# Patient Record
Sex: Female | Born: 1940 | Race: White | Hispanic: No | Smoking: Never smoker
Health system: Southern US, Community
[De-identification: ages and names within clinical notes are randomized; demographics above are authoritative.]

## PROBLEM LIST (undated history)

## (undated) DIAGNOSIS — M1990 Unspecified osteoarthritis, unspecified site: Secondary | ICD-10-CM

## (undated) DIAGNOSIS — K8020 Calculus of gallbladder without cholecystitis without obstruction: Secondary | ICD-10-CM

## (undated) DIAGNOSIS — M359 Systemic involvement of connective tissue, unspecified: Secondary | ICD-10-CM

## (undated) DIAGNOSIS — I471 Supraventricular tachycardia: Secondary | ICD-10-CM

## (undated) DIAGNOSIS — I10 Essential (primary) hypertension: Secondary | ICD-10-CM

## (undated) DIAGNOSIS — F419 Anxiety disorder, unspecified: Secondary | ICD-10-CM

## (undated) DIAGNOSIS — N816 Rectocele: Secondary | ICD-10-CM

## (undated) DIAGNOSIS — N814 Uterovaginal prolapse, unspecified: Secondary | ICD-10-CM

## (undated) DIAGNOSIS — I480 Paroxysmal atrial fibrillation: Secondary | ICD-10-CM

## (undated) HISTORY — DX: Supraventricular tachycardia: I47.1

## (undated) HISTORY — DX: Paroxysmal atrial fibrillation: I48.0

---

## 1998-03-27 ENCOUNTER — Other Ambulatory Visit: Admission: RE | Admit: 1998-03-27 | Discharge: 1998-03-27 | Payer: Self-pay | Admitting: *Deleted

## 1999-06-09 ENCOUNTER — Encounter: Admission: RE | Admit: 1999-06-09 | Discharge: 1999-06-09 | Payer: Self-pay | Admitting: *Deleted

## 1999-06-09 ENCOUNTER — Encounter: Payer: Self-pay | Admitting: *Deleted

## 1999-06-09 ENCOUNTER — Other Ambulatory Visit: Admission: RE | Admit: 1999-06-09 | Discharge: 1999-06-09 | Payer: Self-pay | Admitting: *Deleted

## 2000-07-20 ENCOUNTER — Other Ambulatory Visit: Admission: RE | Admit: 2000-07-20 | Discharge: 2000-07-20 | Payer: Self-pay | Admitting: *Deleted

## 2000-10-10 ENCOUNTER — Encounter: Admission: RE | Admit: 2000-10-10 | Discharge: 2000-10-10 | Payer: Self-pay | Admitting: *Deleted

## 2000-10-10 ENCOUNTER — Encounter: Payer: Self-pay | Admitting: *Deleted

## 2002-06-18 ENCOUNTER — Encounter: Payer: Self-pay | Admitting: Cardiology

## 2002-06-18 ENCOUNTER — Ambulatory Visit (HOSPITAL_COMMUNITY): Admission: RE | Admit: 2002-06-18 | Discharge: 2002-06-19 | Payer: Self-pay | Admitting: Cardiology

## 2002-07-08 ENCOUNTER — Other Ambulatory Visit: Admission: RE | Admit: 2002-07-08 | Discharge: 2002-07-08 | Payer: Self-pay | Admitting: Obstetrics & Gynecology

## 2003-07-23 ENCOUNTER — Other Ambulatory Visit: Admission: RE | Admit: 2003-07-23 | Discharge: 2003-07-23 | Payer: Self-pay | Admitting: Obstetrics & Gynecology

## 2005-02-10 ENCOUNTER — Other Ambulatory Visit: Admission: RE | Admit: 2005-02-10 | Discharge: 2005-02-10 | Payer: Self-pay | Admitting: Obstetrics & Gynecology

## 2005-02-23 ENCOUNTER — Encounter: Admission: RE | Admit: 2005-02-23 | Discharge: 2005-02-23 | Payer: Self-pay | Admitting: Obstetrics & Gynecology

## 2009-04-25 HISTORY — PX: EYE SURGERY: SHX253

## 2011-08-19 DIAGNOSIS — S2341XA Sprain of ribs, initial encounter: Secondary | ICD-10-CM | POA: Diagnosis not present

## 2011-08-19 DIAGNOSIS — R0602 Shortness of breath: Secondary | ICD-10-CM | POA: Diagnosis not present

## 2011-08-19 DIAGNOSIS — S239XXA Sprain of unspecified parts of thorax, initial encounter: Secondary | ICD-10-CM | POA: Diagnosis not present

## 2011-08-24 DIAGNOSIS — S239XXA Sprain of unspecified parts of thorax, initial encounter: Secondary | ICD-10-CM | POA: Diagnosis not present

## 2011-08-24 DIAGNOSIS — S2341XA Sprain of ribs, initial encounter: Secondary | ICD-10-CM | POA: Diagnosis not present

## 2011-08-24 DIAGNOSIS — R5381 Other malaise: Secondary | ICD-10-CM | POA: Diagnosis not present

## 2011-08-24 DIAGNOSIS — E782 Mixed hyperlipidemia: Secondary | ICD-10-CM | POA: Diagnosis not present

## 2011-08-31 DIAGNOSIS — J309 Allergic rhinitis, unspecified: Secondary | ICD-10-CM | POA: Diagnosis not present

## 2011-08-31 DIAGNOSIS — M545 Low back pain: Secondary | ICD-10-CM | POA: Diagnosis not present

## 2011-08-31 DIAGNOSIS — M1990 Unspecified osteoarthritis, unspecified site: Secondary | ICD-10-CM | POA: Diagnosis not present

## 2011-08-31 DIAGNOSIS — S239XXA Sprain of unspecified parts of thorax, initial encounter: Secondary | ICD-10-CM | POA: Diagnosis not present

## 2011-08-31 DIAGNOSIS — E782 Mixed hyperlipidemia: Secondary | ICD-10-CM | POA: Diagnosis not present

## 2011-08-31 DIAGNOSIS — L723 Sebaceous cyst: Secondary | ICD-10-CM | POA: Diagnosis not present

## 2011-11-23 DIAGNOSIS — M7020 Olecranon bursitis, unspecified elbow: Secondary | ICD-10-CM | POA: Diagnosis not present

## 2012-02-27 DIAGNOSIS — M545 Low back pain: Secondary | ICD-10-CM | POA: Diagnosis not present

## 2012-02-27 DIAGNOSIS — E782 Mixed hyperlipidemia: Secondary | ICD-10-CM | POA: Diagnosis not present

## 2012-02-27 DIAGNOSIS — M1990 Unspecified osteoarthritis, unspecified site: Secondary | ICD-10-CM | POA: Diagnosis not present

## 2012-03-01 DIAGNOSIS — M545 Low back pain: Secondary | ICD-10-CM | POA: Diagnosis not present

## 2012-03-01 DIAGNOSIS — E782 Mixed hyperlipidemia: Secondary | ICD-10-CM | POA: Diagnosis not present

## 2012-03-01 DIAGNOSIS — M1990 Unspecified osteoarthritis, unspecified site: Secondary | ICD-10-CM | POA: Diagnosis not present

## 2012-03-01 DIAGNOSIS — J309 Allergic rhinitis, unspecified: Secondary | ICD-10-CM | POA: Diagnosis not present

## 2012-04-06 DIAGNOSIS — J019 Acute sinusitis, unspecified: Secondary | ICD-10-CM | POA: Diagnosis not present

## 2012-05-09 DIAGNOSIS — Z124 Encounter for screening for malignant neoplasm of cervix: Secondary | ICD-10-CM | POA: Diagnosis not present

## 2012-08-28 DIAGNOSIS — E782 Mixed hyperlipidemia: Secondary | ICD-10-CM | POA: Diagnosis not present

## 2012-08-28 DIAGNOSIS — J309 Allergic rhinitis, unspecified: Secondary | ICD-10-CM | POA: Diagnosis not present

## 2012-08-28 DIAGNOSIS — M1990 Unspecified osteoarthritis, unspecified site: Secondary | ICD-10-CM | POA: Diagnosis not present

## 2012-08-28 DIAGNOSIS — M545 Low back pain: Secondary | ICD-10-CM | POA: Diagnosis not present

## 2012-08-30 DIAGNOSIS — E782 Mixed hyperlipidemia: Secondary | ICD-10-CM | POA: Diagnosis not present

## 2012-09-05 DIAGNOSIS — E782 Mixed hyperlipidemia: Secondary | ICD-10-CM | POA: Diagnosis not present

## 2012-09-05 DIAGNOSIS — M545 Low back pain: Secondary | ICD-10-CM | POA: Diagnosis not present

## 2012-09-05 DIAGNOSIS — M810 Age-related osteoporosis without current pathological fracture: Secondary | ICD-10-CM | POA: Diagnosis not present

## 2012-09-05 DIAGNOSIS — J309 Allergic rhinitis, unspecified: Secondary | ICD-10-CM | POA: Diagnosis not present

## 2012-09-05 DIAGNOSIS — M1990 Unspecified osteoarthritis, unspecified site: Secondary | ICD-10-CM | POA: Diagnosis not present

## 2012-12-25 DIAGNOSIS — R000 Tachycardia, unspecified: Secondary | ICD-10-CM | POA: Diagnosis not present

## 2012-12-25 DIAGNOSIS — J019 Acute sinusitis, unspecified: Secondary | ICD-10-CM | POA: Diagnosis not present

## 2013-03-12 DIAGNOSIS — E782 Mixed hyperlipidemia: Secondary | ICD-10-CM | POA: Diagnosis not present

## 2013-03-12 DIAGNOSIS — M810 Age-related osteoporosis without current pathological fracture: Secondary | ICD-10-CM | POA: Diagnosis not present

## 2013-03-12 DIAGNOSIS — M1990 Unspecified osteoarthritis, unspecified site: Secondary | ICD-10-CM | POA: Diagnosis not present

## 2013-03-19 DIAGNOSIS — Z0000 Encounter for general adult medical examination without abnormal findings: Secondary | ICD-10-CM | POA: Diagnosis not present

## 2013-03-19 DIAGNOSIS — E782 Mixed hyperlipidemia: Secondary | ICD-10-CM | POA: Diagnosis not present

## 2013-03-19 DIAGNOSIS — J309 Allergic rhinitis, unspecified: Secondary | ICD-10-CM | POA: Diagnosis not present

## 2013-03-19 DIAGNOSIS — M810 Age-related osteoporosis without current pathological fracture: Secondary | ICD-10-CM | POA: Diagnosis not present

## 2013-03-19 DIAGNOSIS — M1990 Unspecified osteoarthritis, unspecified site: Secondary | ICD-10-CM | POA: Diagnosis not present

## 2013-03-19 DIAGNOSIS — Z1331 Encounter for screening for depression: Secondary | ICD-10-CM | POA: Diagnosis not present

## 2013-03-19 DIAGNOSIS — M545 Low back pain: Secondary | ICD-10-CM | POA: Diagnosis not present

## 2013-05-23 DIAGNOSIS — M810 Age-related osteoporosis without current pathological fracture: Secondary | ICD-10-CM | POA: Diagnosis not present

## 2013-05-23 DIAGNOSIS — Z124 Encounter for screening for malignant neoplasm of cervix: Secondary | ICD-10-CM | POA: Diagnosis not present

## 2013-05-23 DIAGNOSIS — Z01419 Encounter for gynecological examination (general) (routine) without abnormal findings: Secondary | ICD-10-CM | POA: Diagnosis not present

## 2013-05-23 DIAGNOSIS — N959 Unspecified menopausal and perimenopausal disorder: Secondary | ICD-10-CM | POA: Diagnosis not present

## 2013-05-23 DIAGNOSIS — Z1231 Encounter for screening mammogram for malignant neoplasm of breast: Secondary | ICD-10-CM | POA: Diagnosis not present

## 2015-01-23 DIAGNOSIS — I10 Essential (primary) hypertension: Secondary | ICD-10-CM | POA: Diagnosis not present

## 2015-01-23 DIAGNOSIS — K210 Gastro-esophageal reflux disease with esophagitis: Secondary | ICD-10-CM | POA: Diagnosis not present

## 2015-01-23 DIAGNOSIS — E782 Mixed hyperlipidemia: Secondary | ICD-10-CM | POA: Diagnosis not present

## 2015-01-30 DIAGNOSIS — Z23 Encounter for immunization: Secondary | ICD-10-CM | POA: Diagnosis not present

## 2015-01-30 DIAGNOSIS — K219 Gastro-esophageal reflux disease without esophagitis: Secondary | ICD-10-CM | POA: Diagnosis not present

## 2015-01-30 DIAGNOSIS — E782 Mixed hyperlipidemia: Secondary | ICD-10-CM | POA: Diagnosis not present

## 2015-01-30 DIAGNOSIS — I10 Essential (primary) hypertension: Secondary | ICD-10-CM | POA: Diagnosis not present

## 2015-04-28 DIAGNOSIS — W11XXXA Fall on and from ladder, initial encounter: Secondary | ICD-10-CM | POA: Diagnosis not present

## 2015-04-28 DIAGNOSIS — M940 Chondrocostal junction syndrome [Tietze]: Secondary | ICD-10-CM | POA: Diagnosis not present

## 2015-06-12 DIAGNOSIS — J111 Influenza due to unidentified influenza virus with other respiratory manifestations: Secondary | ICD-10-CM | POA: Diagnosis not present

## 2015-06-18 DIAGNOSIS — Z1231 Encounter for screening mammogram for malignant neoplasm of breast: Secondary | ICD-10-CM | POA: Diagnosis not present

## 2015-06-18 DIAGNOSIS — N390 Urinary tract infection, site not specified: Secondary | ICD-10-CM | POA: Diagnosis not present

## 2015-06-18 DIAGNOSIS — M816 Localized osteoporosis [Lequesne]: Secondary | ICD-10-CM | POA: Diagnosis not present

## 2015-06-18 DIAGNOSIS — N958 Other specified menopausal and perimenopausal disorders: Secondary | ICD-10-CM | POA: Diagnosis not present

## 2015-06-18 DIAGNOSIS — Z6822 Body mass index (BMI) 22.0-22.9, adult: Secondary | ICD-10-CM | POA: Diagnosis not present

## 2015-06-18 DIAGNOSIS — Z124 Encounter for screening for malignant neoplasm of cervix: Secondary | ICD-10-CM | POA: Diagnosis not present

## 2015-09-01 DIAGNOSIS — I10 Essential (primary) hypertension: Secondary | ICD-10-CM | POA: Diagnosis not present

## 2015-09-01 DIAGNOSIS — E782 Mixed hyperlipidemia: Secondary | ICD-10-CM | POA: Diagnosis not present

## 2015-09-01 DIAGNOSIS — K210 Gastro-esophageal reflux disease with esophagitis: Secondary | ICD-10-CM | POA: Diagnosis not present

## 2015-09-04 DIAGNOSIS — Z0001 Encounter for general adult medical examination with abnormal findings: Secondary | ICD-10-CM | POA: Diagnosis not present

## 2015-09-04 DIAGNOSIS — K219 Gastro-esophageal reflux disease without esophagitis: Secondary | ICD-10-CM | POA: Diagnosis not present

## 2015-09-04 DIAGNOSIS — Z1212 Encounter for screening for malignant neoplasm of rectum: Secondary | ICD-10-CM | POA: Diagnosis not present

## 2015-09-04 DIAGNOSIS — I10 Essential (primary) hypertension: Secondary | ICD-10-CM | POA: Diagnosis not present

## 2015-09-04 DIAGNOSIS — E782 Mixed hyperlipidemia: Secondary | ICD-10-CM | POA: Diagnosis not present

## 2015-11-12 DIAGNOSIS — N819 Female genital prolapse, unspecified: Secondary | ICD-10-CM | POA: Diagnosis not present

## 2015-11-12 DIAGNOSIS — R102 Pelvic and perineal pain: Secondary | ICD-10-CM | POA: Diagnosis not present

## 2015-11-12 DIAGNOSIS — N812 Incomplete uterovaginal prolapse: Secondary | ICD-10-CM | POA: Diagnosis not present

## 2015-11-12 DIAGNOSIS — N390 Urinary tract infection, site not specified: Secondary | ICD-10-CM | POA: Diagnosis not present

## 2015-11-13 DIAGNOSIS — N812 Incomplete uterovaginal prolapse: Secondary | ICD-10-CM | POA: Diagnosis not present

## 2015-11-13 DIAGNOSIS — N390 Urinary tract infection, site not specified: Secondary | ICD-10-CM | POA: Diagnosis not present

## 2015-11-13 DIAGNOSIS — N819 Female genital prolapse, unspecified: Secondary | ICD-10-CM | POA: Diagnosis not present

## 2015-11-14 DIAGNOSIS — F418 Other specified anxiety disorders: Secondary | ICD-10-CM | POA: Diagnosis not present

## 2015-11-14 DIAGNOSIS — R002 Palpitations: Secondary | ICD-10-CM | POA: Diagnosis not present

## 2015-11-14 DIAGNOSIS — R079 Chest pain, unspecified: Secondary | ICD-10-CM | POA: Diagnosis not present

## 2015-11-14 DIAGNOSIS — Z6821 Body mass index (BMI) 21.0-21.9, adult: Secondary | ICD-10-CM | POA: Diagnosis not present

## 2015-12-21 ENCOUNTER — Other Ambulatory Visit: Payer: Self-pay

## 2016-01-18 DIAGNOSIS — N819 Female genital prolapse, unspecified: Secondary | ICD-10-CM | POA: Diagnosis not present

## 2016-06-15 DIAGNOSIS — I10 Essential (primary) hypertension: Secondary | ICD-10-CM | POA: Diagnosis not present

## 2016-06-15 DIAGNOSIS — Z6822 Body mass index (BMI) 22.0-22.9, adult: Secondary | ICD-10-CM | POA: Diagnosis not present

## 2016-06-15 DIAGNOSIS — E782 Mixed hyperlipidemia: Secondary | ICD-10-CM | POA: Diagnosis not present

## 2016-06-15 DIAGNOSIS — K219 Gastro-esophageal reflux disease without esophagitis: Secondary | ICD-10-CM | POA: Diagnosis not present

## 2016-07-01 DIAGNOSIS — Z6822 Body mass index (BMI) 22.0-22.9, adult: Secondary | ICD-10-CM | POA: Diagnosis not present

## 2016-07-01 DIAGNOSIS — I10 Essential (primary) hypertension: Secondary | ICD-10-CM | POA: Diagnosis not present

## 2016-07-26 DIAGNOSIS — I10 Essential (primary) hypertension: Secondary | ICD-10-CM | POA: Diagnosis not present

## 2016-07-26 DIAGNOSIS — Z6822 Body mass index (BMI) 22.0-22.9, adult: Secondary | ICD-10-CM | POA: Diagnosis not present

## 2016-07-26 DIAGNOSIS — R002 Palpitations: Secondary | ICD-10-CM | POA: Diagnosis not present

## 2016-07-26 DIAGNOSIS — F331 Major depressive disorder, recurrent, moderate: Secondary | ICD-10-CM | POA: Diagnosis not present

## 2016-10-04 DIAGNOSIS — H35363 Drusen (degenerative) of macula, bilateral: Secondary | ICD-10-CM | POA: Diagnosis not present

## 2016-10-10 DIAGNOSIS — Z6822 Body mass index (BMI) 22.0-22.9, adult: Secondary | ICD-10-CM | POA: Diagnosis not present

## 2016-10-10 DIAGNOSIS — Z01419 Encounter for gynecological examination (general) (routine) without abnormal findings: Secondary | ICD-10-CM | POA: Diagnosis not present

## 2016-10-10 DIAGNOSIS — R350 Frequency of micturition: Secondary | ICD-10-CM | POA: Diagnosis not present

## 2016-11-08 DIAGNOSIS — H1131 Conjunctival hemorrhage, right eye: Secondary | ICD-10-CM | POA: Diagnosis not present

## 2016-11-08 DIAGNOSIS — S0501XA Injury of conjunctiva and corneal abrasion without foreign body, right eye, initial encounter: Secondary | ICD-10-CM | POA: Diagnosis not present

## 2016-11-08 DIAGNOSIS — H11421 Conjunctival edema, right eye: Secondary | ICD-10-CM | POA: Diagnosis not present

## 2016-12-03 DIAGNOSIS — Z6821 Body mass index (BMI) 21.0-21.9, adult: Secondary | ICD-10-CM | POA: Diagnosis not present

## 2016-12-03 DIAGNOSIS — I10 Essential (primary) hypertension: Secondary | ICD-10-CM | POA: Diagnosis not present

## 2016-12-03 DIAGNOSIS — R002 Palpitations: Secondary | ICD-10-CM | POA: Diagnosis not present

## 2016-12-03 DIAGNOSIS — Z23 Encounter for immunization: Secondary | ICD-10-CM | POA: Diagnosis not present

## 2016-12-03 DIAGNOSIS — F331 Major depressive disorder, recurrent, moderate: Secondary | ICD-10-CM | POA: Diagnosis not present

## 2017-01-10 DIAGNOSIS — J0100 Acute maxillary sinusitis, unspecified: Secondary | ICD-10-CM | POA: Diagnosis not present

## 2017-01-10 DIAGNOSIS — Z6822 Body mass index (BMI) 22.0-22.9, adult: Secondary | ICD-10-CM | POA: Diagnosis not present

## 2017-01-20 DIAGNOSIS — H11823 Conjunctivochalasis, bilateral: Secondary | ICD-10-CM | POA: Diagnosis not present

## 2017-01-20 DIAGNOSIS — H04123 Dry eye syndrome of bilateral lacrimal glands: Secondary | ICD-10-CM | POA: Diagnosis not present

## 2017-01-24 DIAGNOSIS — R002 Palpitations: Secondary | ICD-10-CM | POA: Diagnosis not present

## 2017-01-24 DIAGNOSIS — Z6823 Body mass index (BMI) 23.0-23.9, adult: Secondary | ICD-10-CM | POA: Diagnosis not present

## 2017-01-31 DIAGNOSIS — R0789 Other chest pain: Secondary | ICD-10-CM | POA: Diagnosis not present

## 2017-01-31 DIAGNOSIS — N644 Mastodynia: Secondary | ICD-10-CM | POA: Diagnosis not present

## 2017-01-31 DIAGNOSIS — R7989 Other specified abnormal findings of blood chemistry: Secondary | ICD-10-CM | POA: Diagnosis not present

## 2017-01-31 DIAGNOSIS — Z79899 Other long term (current) drug therapy: Secondary | ICD-10-CM | POA: Diagnosis not present

## 2017-02-01 DIAGNOSIS — Z6822 Body mass index (BMI) 22.0-22.9, adult: Secondary | ICD-10-CM | POA: Diagnosis not present

## 2017-02-01 DIAGNOSIS — R1011 Right upper quadrant pain: Secondary | ICD-10-CM | POA: Diagnosis not present

## 2017-02-01 DIAGNOSIS — H6121 Impacted cerumen, right ear: Secondary | ICD-10-CM | POA: Diagnosis not present

## 2017-02-07 DIAGNOSIS — K8020 Calculus of gallbladder without cholecystitis without obstruction: Secondary | ICD-10-CM | POA: Diagnosis not present

## 2017-02-07 DIAGNOSIS — R1011 Right upper quadrant pain: Secondary | ICD-10-CM | POA: Diagnosis not present

## 2017-02-28 ENCOUNTER — Emergency Department (HOSPITAL_COMMUNITY): Payer: Medicare Other

## 2017-02-28 ENCOUNTER — Other Ambulatory Visit: Payer: Self-pay

## 2017-02-28 ENCOUNTER — Observation Stay (HOSPITAL_COMMUNITY)
Admission: EM | Admit: 2017-02-28 | Discharge: 2017-03-01 | Disposition: A | Discharge status: Home / Self Care | Payer: Medicare Other | Attending: Family Medicine | Admitting: Family Medicine

## 2017-02-28 ENCOUNTER — Encounter (HOSPITAL_COMMUNITY): Payer: Self-pay | Admitting: Emergency Medicine

## 2017-02-28 DIAGNOSIS — F4381 Prolonged grief disorder: Secondary | ICD-10-CM | POA: Diagnosis present

## 2017-02-28 DIAGNOSIS — Z79899 Other long term (current) drug therapy: Secondary | ICD-10-CM | POA: Insufficient documentation

## 2017-02-28 DIAGNOSIS — R7989 Other specified abnormal findings of blood chemistry: Secondary | ICD-10-CM | POA: Diagnosis not present

## 2017-02-28 DIAGNOSIS — R739 Hyperglycemia, unspecified: Secondary | ICD-10-CM | POA: Diagnosis not present

## 2017-02-28 DIAGNOSIS — E785 Hyperlipidemia, unspecified: Secondary | ICD-10-CM | POA: Diagnosis not present

## 2017-02-28 DIAGNOSIS — I480 Paroxysmal atrial fibrillation: Secondary | ICD-10-CM | POA: Diagnosis not present

## 2017-02-28 DIAGNOSIS — Z6822 Body mass index (BMI) 22.0-22.9, adult: Secondary | ICD-10-CM | POA: Diagnosis not present

## 2017-02-28 DIAGNOSIS — R748 Abnormal levels of other serum enzymes: Secondary | ICD-10-CM | POA: Diagnosis not present

## 2017-02-28 DIAGNOSIS — I471 Supraventricular tachycardia: Principal | ICD-10-CM | POA: Insufficient documentation

## 2017-02-28 DIAGNOSIS — R079 Chest pain, unspecified: Secondary | ICD-10-CM | POA: Diagnosis not present

## 2017-02-28 DIAGNOSIS — I491 Atrial premature depolarization: Secondary | ICD-10-CM | POA: Diagnosis not present

## 2017-02-28 DIAGNOSIS — I10 Essential (primary) hypertension: Secondary | ICD-10-CM | POA: Diagnosis not present

## 2017-02-28 DIAGNOSIS — R000 Tachycardia, unspecified: Secondary | ICD-10-CM | POA: Diagnosis present

## 2017-02-28 DIAGNOSIS — F4329 Adjustment disorder with other symptoms: Secondary | ICD-10-CM | POA: Diagnosis not present

## 2017-02-28 DIAGNOSIS — Z888 Allergy status to other drugs, medicaments and biological substances status: Secondary | ICD-10-CM | POA: Diagnosis not present

## 2017-02-28 DIAGNOSIS — R778 Other specified abnormalities of plasma proteins: Secondary | ICD-10-CM

## 2017-02-28 HISTORY — DX: Essential (primary) hypertension: I10

## 2017-02-28 HISTORY — DX: Unspecified osteoarthritis, unspecified site: M19.90

## 2017-02-28 HISTORY — DX: Calculus of gallbladder without cholecystitis without obstruction: K80.20

## 2017-02-28 LAB — CBC WITH DIFFERENTIAL/PLATELET
Basophils Absolute: 0 10*3/uL (ref 0.0–0.1)
Basophils Relative: 0 %
EOS PCT: 1 %
Eosinophils Absolute: 0.2 10*3/uL (ref 0.0–0.7)
HCT: 45.5 % (ref 36.0–46.0)
Hemoglobin: 15.6 g/dL — ABNORMAL HIGH (ref 12.0–15.0)
LYMPHS ABS: 2.3 10*3/uL (ref 0.7–4.0)
LYMPHS PCT: 20 %
MCH: 32.6 pg (ref 26.0–34.0)
MCHC: 34.3 g/dL (ref 30.0–36.0)
MCV: 95 fL (ref 78.0–100.0)
MONO ABS: 0.9 10*3/uL (ref 0.1–1.0)
MONOS PCT: 8 %
NEUTROS PCT: 71 %
Neutro Abs: 8 10*3/uL — ABNORMAL HIGH (ref 1.7–7.7)
PLATELETS: 243 10*3/uL (ref 150–400)
RBC: 4.79 MIL/uL (ref 3.87–5.11)
RDW: 13.5 % (ref 11.5–15.5)
WBC: 11.4 10*3/uL — ABNORMAL HIGH (ref 4.0–10.5)

## 2017-02-28 LAB — TROPONIN I: Troponin I: 0.4 ng/mL (ref <0.03)

## 2017-02-28 LAB — BASIC METABOLIC PANEL
Anion gap: 12 (ref 5–15)
BUN: 16 mg/dL (ref 6–20)
CO2: 26 mmol/L (ref 22–32)
Calcium: 9.9 mg/dL (ref 8.9–10.3)
Chloride: 99 mmol/L — ABNORMAL LOW (ref 101–111)
Creatinine, Ser: 0.68 mg/dL (ref 0.44–1.00)
GFR calc Af Amer: >60 mL/min (ref >60)
GLUCOSE: 118 mg/dL — AB (ref 65–99)
POTASSIUM: 3.8 mmol/L (ref 3.5–5.1)
Sodium: 137 mmol/L (ref 135–145)

## 2017-02-28 LAB — PROTIME-INR
INR: 1.01
Prothrombin Time: 13.2 seconds (ref 11.4–15.2)

## 2017-02-28 LAB — TSH: TSH: 3.521 u[IU]/mL (ref 0.350–4.500)

## 2017-02-28 MED ORDER — ACETAMINOPHEN 325 MG PO TABS: 650.0000 mg | ORAL_TABLET | ORAL | Status: DC | PRN

## 2017-02-28 MED ORDER — ASPIRIN EC 81 MG PO TBEC: 81.0000 mg | DELAYED_RELEASE_TABLET | Freq: Every day | ORAL | Status: DC

## 2017-02-28 MED ORDER — DILTIAZEM LOAD VIA INFUSION
10.0000 mg | Freq: Once | INTRAVENOUS | Status: AC
  Administered 2017-02-28: 10 mg via INTRAVENOUS
  Filled 2017-02-28: qty 10

## 2017-02-28 MED ORDER — ASPIRIN 325 MG PO TABS
325.0000 mg | ORAL_TABLET | Freq: Once | ORAL | Status: AC
  Administered 2017-02-28: 325 mg via ORAL
  Filled 2017-02-28: qty 1

## 2017-02-28 MED ORDER — LISINOPRIL 5 MG PO TABS: 2.5000 mg | ORAL_TABLET | Freq: Every day | ORAL | Status: DC

## 2017-02-28 MED ORDER — HEPARIN (PORCINE) IN NACL 100-0.45 UNIT/ML-% IJ SOLN
700.0000 [IU]/h | INTRAMUSCULAR | Status: DC
  Administered 2017-02-28: 700 [IU]/h via INTRAVENOUS
  Filled 2017-02-28: qty 250

## 2017-02-28 MED ORDER — ADENOSINE 6 MG/2ML IV SOLN
6.0000 mg | Freq: Once | INTRAVENOUS | Status: AC
  Administered 2017-02-28: 6 mg via INTRAVENOUS

## 2017-02-28 MED ORDER — DILTIAZEM HCL-DEXTROSE 100-5 MG/100ML-% IV SOLN (PREMIX)
5.0000 mg/h | INTRAVENOUS | Status: DC
  Administered 2017-02-28: 5 mg/h via INTRAVENOUS
  Filled 2017-02-28: qty 100

## 2017-02-28 MED ORDER — HEPARIN BOLUS VIA INFUSION
3500.0000 [IU] | Freq: Once | INTRAVENOUS | Status: AC
  Administered 2017-02-28: 3500 [IU] via INTRAVENOUS

## 2017-02-28 MED ORDER — ONDANSETRON HCL 4 MG/2ML IJ SOLN: 4.0000 mg | Freq: Four times a day (QID) | INTRAMUSCULAR | Status: DC | PRN

## 2017-02-28 MED ORDER — ADENOSINE 6 MG/2ML IV SOLN
INTRAVENOUS | Status: AC
  Administered 2017-02-28: 6 mg
  Filled 2017-02-28: qty 4

## 2017-02-28 MED ORDER — GI COCKTAIL ~~LOC~~: 30.0000 mL | Freq: Four times a day (QID) | ORAL | Status: DC | PRN

## 2017-02-28 MED ORDER — MORPHINE SULFATE (PF) 2 MG/ML IV SOLN: 2.0000 mg | INTRAVENOUS | Status: DC | PRN

## 2017-02-28 MED ORDER — HEPARIN SODIUM (PORCINE) 5000 UNIT/ML IJ SOLN: 60.0000 [IU]/kg | Freq: Once | INTRAMUSCULAR | Status: DC

## 2017-02-28 NOTE — ED Triage Notes (Signed)
Pt sent by pcp Dr Olena Heckle in [place] due to a fib. Pt in RVR in triage. Pt states has had upper left side chest aching since last night. Dizziness this am but none now. Non diaphoretic. Denies/n/v

## 2017-02-28 NOTE — ED Notes (Signed)
No change in rate. Dr Lacinda Axon ordered a second dose of adenosine 6 mg

## 2017-02-28 NOTE — H&P (Signed)
History and Physical    Kristen Mckenzie GLO:[id number] DOB: 1941/01/12 DOA: 02/28/2017  PCP: Caryl Bis, MD Consultants:  Nori Riis - OB/GYN Patient coming from:  Home - lives alone; [facility]: daughters, [phone]  Chief Complaint: Rapid heart rate  HPI: Kristen Mckenzie is a 76 y.o. female with medical history significant of HTN presenting with tachycardia.  She reports that her heart rate was high and her BP was low.  She checks her BP several times a week.  She didn't feel right this AM, lightheaded and dizzy, and so she checked her BP; it was 76/- and HR was 154.  She rechecked 2 hours later and it was 80/- and HR was 160.  She called her PCP and was told to come to the ER.  No chest pain.  No SOB.  She has had this several times before.    Her husband of 46 years died 31 months ago and she is still struggling.    ED Course: EKG with SVT 160-170, stable BP.  Given 6 mg Adenosine and she had a 5-6 second pause.  Dr. Domenic Polite suggested Cardizem.  She was given 10 mg IV Cardizem prior to drip and converted to NSR rate 80.  Troponin 0.40.  Cardiology recommends heparin; ASA; Echo.  Review of Systems: As per HPI; otherwise review of systems reviewed and negative.   Ambulatory Status:  Ambulates without assistance  Past Medical History:  Diagnosis Date  . Arthritis   . Cholecystolithiasis   . Hypertension     History reviewed. No pertinent surgical history.  Social History   Socioeconomic History  . Marital status: Married    Spouse name: Not on file  . Number of children: Not on file  . Years of education: Not on file  . Highest education level: Not on file  Social Needs  . Financial resource strain: Not on file  . Food insecurity - worry: Not on file  . Food insecurity - inability: Not on file  . Transportation needs - medical: Not on file  . Transportation needs - non-medical: Not on file  Occupational History  . Occupation: retired  Tobacco Use  . Smoking status: Never  Smoker  . Smokeless tobacco: Never Used  Substance and Sexual Activity  . Alcohol use: No    Frequency: Never  . Drug use: No  . Sexual activity: Not on file  Other Topics Concern  . Not on file  Social History Narrative  . Not on file    Allergies  Allergen Reactions  . Depakote [Valproic Acid] Hives and Itching    History reviewed. No pertinent family history.  Prior to Admission medications   Medication Sig Start Date End Date Taking? Authorizing Provider  B-COMPLEX-C PO Take 1 tablet daily by mouth.   Yes [provider]  Calcium Carbonate-Vitamin D (CALCARB 600/D PO) Take by mouth.   Yes [provider]  cholecalciferol (VITAMIN D) 1000 units tablet Take 1,000 Units daily by mouth.   Yes [provider]  COD LIVER OIL PO Take 1 capsule daily by mouth.   Yes [provider]  lisinopril (PRINIVIL,ZESTRIL) 5 MG tablet Take 2.5 mg daily by mouth.   Yes [provider]  Multiple Vitamin (MULTIVITAMIN WITH MINERALS) TABS tablet Take 1 tablet daily by mouth.   Yes [provider]  Potassium 99 MG TABS Take 1 tablet 3 (three) times a week by mouth.   Yes [provider]  vitamin A 10000 UNIT  capsule Take 10,000 Units daily by mouth.   Yes [provider]  vitamin C (ASCORBIC ACID) 500 MG tablet Take 500 mg daily by mouth.   Yes [provider]  vitamin E 400 UNIT capsule Take 400 Units daily by mouth.   Yes [provider]    Physical Exam: Vitals:   02/28/17 1717 02/28/17 1745 02/28/17 1830 02/28/17 1900  BP:   138/74 115/77  Pulse:  76 67 73  Resp:  19 15 14   Temp:      TempSrc:      SpO2:  100% 100% 100%  Weight: 56.2 kg (124 lb)     Height: 5\' 5"  (1.651 m)        General: Appears calm and comfortable and is NAD Eyes:  PERRL, EOMI, normal lids, iris ENT:  grossly normal hearing, lips & tongue, mmm; appropriate dentition Neck:  no LAD, masses or thyromegaly Cardiovascular:   RRR, no m/r/g. No LE edema.  Respiratory:   CTA bilaterally with no wheezes/rales/rhonchi.  Normal respiratory effort. Abdomen:  soft, NT, ND, NABS Back:   normal alignment, no CVAT Skin:  no rash or induration seen on limited exam Musculoskeletal:  grossly normal tone BUE/BLE, good ROM, no bony abnormality Psychiatric:  grossly normal mood and affect, speech fluent and appropriate, AOx3.  Intermittently quite emotionally labile anytime her deceased spouse comes up. Neurologic:  CN 2-12 grossly intact, moves all extremities in coordinated fashion, sensation intact    Radiological Exams on Admission: Dg Chest 2 View  Result Date: 02/28/2017 CLINICAL DATA:  Atrial fibrillation. Tachycardia. Chest pain since yesterday. EXAM: CHEST  2 VIEW COMPARISON:  01/31/2017. FINDINGS: The heart size and mediastinal contours are within normal limits. Both lungs are clear. The visualized skeletal structures are unremarkable. Compared with priors, improved aeration. IMPRESSION: No active cardiopulmonary disease. Electronically Signed   By: Staci Righter M.D.   On: 02/28/2017 17:46    EKG: Independently reviewed.   1- Afib with RVR, rate 179 at 3:25 2- Afib with RVR, rate 140 at 4:10 3- NSR, rate 75, no evidence of acute ischemia at 4:38   Labs on Admission: I have personally reviewed the available labs and imaging studies at the time of the admission.  Pertinent labs:   Glucose 118 Troponin 0.40 WBC 11.4 INR 1.01   Assessment/Plan Principal Problem:   Tachycardia Active Problems:   Essential hypertension   Acute hyperglycemia   Prolonged grief reaction   Tachycardia -Patient with acute/subacute onset of tachycardia to the 170 range  -Initial EKG thought to represent SVT and so patient was treated with adenosine -Patient had a prolonged pause following adenosine -Subsequent EKG appeared to show SVT vs. afib -After consultation by telephone with Dr. Domenic Polite, patient was given Cardizem  bolus and converted to NSR -Patient reports h/o similar episodes in the past with spontaneous resolution; she may have had improvement with vagal maneuvers, as well -Will observe on telemetry overnight -Will give ASA 81 mg PO daily.   -Will request Echocardiogram for further evaluation  -Will risk stratify with FLP and HgbA1c; will also order TSH/free T4.  -Repeat EKG in AM and plan to consult cardiology at that time. -Heart rate is well controlled currently and is back in NSR. -CHA2DS2-VASc Score is >2 and so patient will need oral anticoagulation if this is thought to be afib; for now, patient is on Heparin drip.  -Elevated troponin is likely type 2 NSTEMI related to demand ischemia; will trend troponin.  Currently  low suspicion for ACS.  HTN -Continue Lisinopril -Consider addition of beta blocker for rate control if ongoing issues with tachycardia.  Hyperglycemia -May be stress response -Will follow with fasting AM labs and order Hgb A1c  Prolonged grief reaction -Patient is at risk for Takotsubo due to ongoing severe grief -I have encouraged her to consider counseling -She is not interested in medication at this time   DVT prophylaxis: Heparin drip Code Status:  Full - confirmed with patient/family Family Communication: Daughter present throughout evaluation  Disposition Plan:  Home once clinically improved Consults called: Cardiology - to see in AM  Admission status: It is my clinical opinion that referral for OBSERVATION is reasonable and necessary in this patient based on the above information provided. The aforementioned taken together are felt to place the patient at high risk for further clinical deterioration. However it is anticipated that the patient may be medically stable for discharge from the hospital within 24 to 48 hours.    Karmen Bongo MD [facility]  If note is complete, please contact covering daytime or nighttime  physician. [email or web address] Password [facility]  02/28/2017, 7:31 PM

## 2017-02-28 NOTE — ED Notes (Signed)
HR decrease to 82 at this time

## 2017-02-28 NOTE — ED Notes (Signed)
CRITICAL VALUE ALERT  Critical Value:  Troponin 0.40  Date & Time Notied:  02/28/17 at [place]  Provider Notified: Lacinda Axon  Orders Received/Actions taken: EDP made aware

## 2017-02-28 NOTE — Progress Notes (Signed)
ANTICOAGULATION CONSULT NOTE - Initial Consult  Pharmacy Consult for Heparin Indication: chest pain/ACS  Allergies  Allergen Reactions  . Depakote [Valproic Acid] Hives and Itching    Patient Measurements: Height: 5\' 5"  (165.1 cm) Weight: 124 lb (56.2 kg) IBW/kg (Calculated) : 57 HEPARIN DW (KG): 56.2  Vital Signs: Temp: 98.5 F (36.9 C) (11/06 1527) Temp Source: Oral (11/06 1527) BP: 130/93 (11/06 1630) Pulse Rate: 143 (11/06 1630)  Labs: Recent Labs    02/28/17 1545  HGB 15.6*  HCT 45.5  PLT 243  CREATININE 0.68  TROPONINI 0.40*    Estimated Creatinine Clearance: 52.2 mL/min (by C-G formula based on SCr of 0.68 mg/dL).   Medical History: Past Medical History:  Diagnosis Date  . Arthritis   . Cholecystolithiasis   . Hypertension     Medications:  See med rec Assessment: 76 yo female presents to ED with chest pain. Patient reports a rapid heart rate and aching chest pain since last night.  Troponins elevated, asked to start heparin.  Goal of Therapy:  Heparin level 0.3-0.7 units/ml Monitor platelets by anticoagulation protocol: Yes   Plan:  Give 3500 units bolus x 1 Start heparin infusion at 700 units/hr Check anti-Xa level in 6-8 hours hours and daily while on heparin Continue to monitor H&H and platelets  Isac Sarna, BS [place], BCPS Clinical Pharmacist Pager [phone]  Isac Sarna L 02/28/2017,5:19 PM

## 2017-02-28 NOTE — ED Provider Notes (Addendum)
[facility] EMERGENCY DEPARTMENT Provider Note   CSN: [id number] Arrival date & time: 02/28/17  1508     History   Chief Complaint Chief Complaint  Patient presents with  . Chest Pain    HPI Kristen Mckenzie is a 76 y.o. female.  Level 5 caveat for urgent need for intervention.  Patient reports a rapid heart rate and aching chest pain and aching chest pain since last night.  This is happened 2 other times in the past year and a half it has resolved spontaneously.  No known significant cardiac disease.  Past medical history hypertension.  Non-smoker.      Past Medical History:  Diagnosis Date  . Arthritis   . Cholecystolithiasis   . Hypertension     There are no active problems to display for this patient.   History reviewed. No pertinent surgical history.  OB History    No data available       Home Medications    Prior to Admission medications   Medication Sig Start Date End Date Taking? Authorizing Provider  B-COMPLEX-C PO Take 1 tablet daily by mouth.   Yes [provider]  Calcium Carbonate-Vitamin D (CALCARB 600/D PO) Take by mouth.   Yes [provider]  cholecalciferol (VITAMIN D) 1000 units tablet Take 1,000 Units daily by mouth.   Yes [provider]  COD LIVER OIL PO Take 1 capsule daily by mouth.   Yes [provider]  lisinopril (PRINIVIL,ZESTRIL) 5 MG tablet Take 2.5 mg daily by mouth.   Yes [provider]  Multiple Vitamin (MULTIVITAMIN WITH MINERALS) TABS tablet Take 1 tablet daily by mouth.   Yes [provider]  Potassium 99 MG TABS Take 1 tablet 3 (three) times a week by mouth.   Yes [provider]  vitamin A 10000 UNIT capsule Take 10,000 Units daily by mouth.   Yes [provider]  vitamin C (ASCORBIC ACID) 500 MG tablet Take 500 mg daily by mouth.   Yes [provider]  vitamin E 400 UNIT capsule Take 400 Units daily by mouth.   Yes [provider]     Family History History reviewed. No pertinent family history.  Social History Social History   Tobacco Use  . Smoking status: Never Smoker  . Smokeless tobacco: Never Used  Substance Use Topics  . Alcohol use: No    Frequency: Never  . Drug use: No     Allergies   Depakote [valproic acid]   Review of Systems Review of Systems  Unable to perform ROS: Acuity of condition     Physical Exam Updated Vital Signs BP (!) 130/93   Pulse (!) 143   Temp 98.5 F (36.9 C) (Oral)   Resp (!) 21   Ht 5\' 5"  (1.651 m)   Wt 56.2 kg (124 lb)   SpO2 99%   BMI 20.63 kg/m   Physical Exam  Constitutional: She is oriented to person, place, and time. She appears well-developed and well-nourished.  HENT:  Head: Normocephalic and atraumatic.  Eyes: Conjunctivae are normal.  Neck: Neck supple.  Cardiovascular: Regular rhythm.  Tachycardic  Pulmonary/Chest: Effort normal and breath sounds normal.  Abdominal: Soft. Bowel sounds are normal.  Musculoskeletal: Normal range of motion.  Neurological: She is alert and oriented to person, place, and time.  Skin: Skin is warm and dry.  Psychiatric: She has a normal mood and affect. Her behavior is normal.  Nursing note and vitals reviewed.  ED Treatments / Results  Labs (all labs ordered are listed, but only abnormal results are displayed) Labs Reviewed  CBC WITH DIFFERENTIAL/PLATELET - Abnormal; Notable for the following components:      Result Value   WBC 11.4 (*)    Hemoglobin 15.6 (*)    Neutro Abs 8.0 (*)    All other components within normal limits  BASIC METABOLIC PANEL - Abnormal; Notable for the following components:   Chloride 99 (*)    Glucose, Bld 118 (*)    All other components within normal limits  TROPONIN I - Abnormal; Notable for the following components:   Troponin I 0.40 (*)    All other components within normal limits  PROTIME-INR  CBC  HEPARIN LEVEL (UNFRACTIONATED)    EKG  EKG  Interpretation  Date/Time:  Tuesday February 28 2017 16:10:36 EST Ventricular Rate:  140 PR Interval:    QRS Duration: 108 QT Interval:  337 QTC Calculation: 515 R Axis:   0 Text Interpretation:  Sinus tachycardia Inferolateral infarct, old Prolonged QT interval Confirmed by Nat Christen [phone]) on 02/28/2017 5:44:30 PM       Radiology No results found.  Procedures Procedures (including critical care time)  Medications Ordered in ED Medications  diltiazem (CARDIZEM) 1 mg/mL load via infusion 10 mg (10 mg Intravenous Bolus from Bag 02/28/17 1633)    And  diltiazem (CARDIZEM) 100 mg in dextrose 5% 131mL (1 mg/mL) infusion (5 mg/hr Intravenous New Bag/Given 02/28/17 1630)  aspirin tablet 325 mg (not administered)  heparin bolus via infusion 3,500 Units (not administered)    Followed by  heparin ADULT infusion 100 units/mL (25000 units/272mL sodium chloride 0.45%) (not administered)  adenosine (ADENOCARD) 6 MG/2ML injection (6 mg  Given 02/28/17 1550)  adenosine (ADENOCARD) 6 MG/2ML injection 6 mg (6 mg Intravenous Given 02/28/17 1554)     Initial Impression / Assessment and Plan / ED Course  I have reviewed the triage vital signs and the nursing notes.  Pertinent labs & imaging results that were available during my care of the patient were reviewed by me and considered in my medical decision making (see chart for details).     CRITICAL CARE Performed by: Nat Christen Total critical care time: 35 minutes Critical care time was exclusive of separately billable procedures and treating other patients. Critical care was necessary to treat or prevent imminent or life-threatening deterioration. Critical care was time spent personally by me on the following activities: development of treatment plan with patient and/or surrogate as well as nursing, discussions with consultants, evaluation of patient's response to treatment, examination of patient, obtaining history from patient or  surrogate, ordering and performing treatments and interventions, ordering and review of laboratory studies, ordering and review of radiographic studies, pulse oximetry and re-evaluation of patient's condition.   Patient presents with chest pain and tachycardia.  EKG reveals SVT rate 160-170.  Blood pressure was stable.  Adenosine 6 mg given which appeared to stall her pulse for approximately 5-6 seconds.  I discussed her care with a cardiologist on call Dr. Domenic Polite. He recommended Cardizem.  Patient responded well to the initial bolus of 10 mg Cardizem.  She then converted to normal sinus rhythm rate 80.  Initial troponin 0 0.40.  Reconsulted cardiology.  The cardiologist recommended aspirin and heparin.  She will need an echocardiogram tomorrow and admission.  Final Clinical Impressions(s) / ED Diagnoses   Final diagnoses:  SVT (supraventricular tachycardia) (HCC)  Elevated troponin    ED Discharge Orders  None       Nat Christen, MD 02/28/17 1707    Nat Christen, MD 02/28/17 [phone]

## 2017-02-28 NOTE — ED Notes (Signed)
Noted pause approx 5 sec. HR decreased to 36 then increase to 177

## 2017-03-01 ENCOUNTER — Observation Stay (HOSPITAL_COMMUNITY): Payer: Medicare Other

## 2017-03-01 ENCOUNTER — Other Ambulatory Visit: Payer: Self-pay | Admitting: Student

## 2017-03-01 ENCOUNTER — Encounter (HOSPITAL_COMMUNITY): Payer: Self-pay | Admitting: Cardiology

## 2017-03-01 DIAGNOSIS — R55 Syncope and collapse: Secondary | ICD-10-CM

## 2017-03-01 DIAGNOSIS — I361 Nonrheumatic tricuspid (valve) insufficiency: Secondary | ICD-10-CM | POA: Diagnosis not present

## 2017-03-01 DIAGNOSIS — I248 Other forms of acute ischemic heart disease: Secondary | ICD-10-CM | POA: Diagnosis not present

## 2017-03-01 DIAGNOSIS — I471 Supraventricular tachycardia: Secondary | ICD-10-CM

## 2017-03-01 DIAGNOSIS — I491 Atrial premature depolarization: Secondary | ICD-10-CM | POA: Diagnosis not present

## 2017-03-01 DIAGNOSIS — Z888 Allergy status to other drugs, medicaments and biological substances status: Secondary | ICD-10-CM | POA: Diagnosis not present

## 2017-03-01 DIAGNOSIS — Z79899 Other long term (current) drug therapy: Secondary | ICD-10-CM | POA: Diagnosis not present

## 2017-03-01 DIAGNOSIS — R000 Tachycardia, unspecified: Secondary | ICD-10-CM | POA: Diagnosis not present

## 2017-03-01 DIAGNOSIS — F4329 Adjustment disorder with other symptoms: Secondary | ICD-10-CM | POA: Diagnosis not present

## 2017-03-01 DIAGNOSIS — I10 Essential (primary) hypertension: Secondary | ICD-10-CM

## 2017-03-01 DIAGNOSIS — R739 Hyperglycemia, unspecified: Secondary | ICD-10-CM

## 2017-03-01 DIAGNOSIS — E785 Hyperlipidemia, unspecified: Secondary | ICD-10-CM | POA: Diagnosis not present

## 2017-03-01 DIAGNOSIS — R748 Abnormal levels of other serum enzymes: Secondary | ICD-10-CM

## 2017-03-01 LAB — LIPID PANEL
Cholesterol: 222 mg/dL — ABNORMAL HIGH (ref 0–200)
HDL: 47 mg/dL (ref >40)
LDL CALC: 153 mg/dL — AB (ref 0–99)
Total CHOL/HDL Ratio: 4.7 RATIO
Triglycerides: 109 mg/dL (ref <150)
VLDL: 22 mg/dL (ref 0–40)

## 2017-03-01 LAB — T4, FREE: Free T4: 1.2 ng/dL — ABNORMAL HIGH (ref 0.61–1.12)

## 2017-03-01 LAB — HEPARIN LEVEL (UNFRACTIONATED)
HEPARIN UNFRACTIONATED: 0.47 [IU]/mL (ref 0.30–0.70)
Heparin Unfractionated: 0.42 IU/mL (ref 0.30–0.70)

## 2017-03-01 LAB — CBC
HCT: 34.9 % — ABNORMAL LOW (ref 36.0–46.0)
HEMOGLOBIN: 11.9 g/dL — AB (ref 12.0–15.0)
MCH: 32.3 pg (ref 26.0–34.0)
MCHC: 34.1 g/dL (ref 30.0–36.0)
MCV: 94.8 fL (ref 78.0–100.0)
PLATELETS: 132 10*3/uL — AB (ref 150–400)
RBC: 3.68 MIL/uL — ABNORMAL LOW (ref 3.87–5.11)
RDW: 13.4 % (ref 11.5–15.5)
WBC: 5 10*3/uL (ref 4.0–10.5)

## 2017-03-01 LAB — HEMOGLOBIN A1C
HEMOGLOBIN A1C: 5.6 % (ref 4.8–5.6)
Mean Plasma Glucose: 114.02 mg/dL

## 2017-03-01 LAB — ECHOCARDIOGRAM COMPLETE
HEIGHTINCHES: 65 in
Weight: 1984 oz

## 2017-03-01 LAB — TROPONIN I
TROPONIN I: 0.36 ng/mL — AB (ref <0.03)
TROPONIN I: 0.44 ng/mL — AB (ref <0.03)
Troponin I: 0.3 ng/mL (ref <0.03)

## 2017-03-01 MED ORDER — ASPIRIN 81 MG PO TBEC: 81.0000 mg | DELAYED_RELEASE_TABLET | Freq: Every day | ORAL | Status: DC

## 2017-03-01 MED ORDER — DILTIAZEM HCL ER COATED BEADS 120 MG PO CP24
120.0000 mg | ORAL_CAPSULE | Freq: Every day | ORAL | Status: DC
  Administered 2017-03-01: 120 mg via ORAL
  Filled 2017-03-01: qty 1

## 2017-03-01 MED ORDER — ATORVASTATIN CALCIUM 40 MG PO TABS
40.0000 mg | ORAL_TABLET | Freq: Every day | ORAL | Status: DC
  Filled 2017-03-01: qty 1

## 2017-03-01 MED ORDER — DILTIAZEM HCL ER COATED BEADS 120 MG PO CP24: 120.0000 mg | ORAL_CAPSULE | Freq: Every day | ORAL | 0 refills | Status: DC

## 2017-03-01 MED ORDER — ATORVASTATIN CALCIUM 40 MG PO TABS: 40.0000 mg | ORAL_TABLET | Freq: Every day | ORAL | 0 refills | Status: DC

## 2017-03-01 NOTE — Progress Notes (Signed)
ANTICOAGULATION CONSULT NOTE - follow up  Pharmacy Consult for Heparin Indication: chest pain/ACS  Allergies  Allergen Reactions  . Depakote [Valproic Acid] Hives and Itching    Patient Measurements: Height: 5\' 5"  (165.1 cm) Weight: 124 lb (56.2 kg) IBW/kg (Calculated) : 57 HEPARIN DW (KG): 56.2  Vital Signs: Temp: 98 F (36.7 C) (11/07 0800) Temp Source: Oral (11/07 0800) BP: 120/60 (11/07 0800) Pulse Rate: 64 (11/07 0800)  Labs: Recent Labs    02/28/17 1545 02/28/17 1712 03/01/17 0018 03/01/17 0458 03/01/17 0954  HGB 15.6*  --   --   --   --   HCT 45.5  --   --   --   --   PLT 243  --   --   --   --   LABPROT  --  13.2  --   --   --   INR  --  1.01  --   --   --   HEPARINUNFRC  --   --  0.42  --  0.47  CREATININE 0.68  --   --   --   --   TROPONINI 0.40*  --  0.44* 0.36* 0.30*    Estimated Creatinine Clearance: 52.2 mL/min (by C-G formula based on SCr of 0.68 mg/dL).   Medical History: Past Medical History:  Diagnosis Date  . Arthritis   . Cholecystolithiasis   . Hypertension     Medications:  See med rec Assessment: 76 yo female presents to ED with chest pain. Patient reports a rapid heart rate and aching chest pain since last night.  Troponins elevated but now trending down. Heparin level remains therapeutic.  Goal of Therapy:  Heparin level 0.3-0.7 units/ml Monitor platelets by anticoagulation protocol: Yes   Plan:  Continue heparin infusion at 700 units/hr Check anti-Xa level daily while on heparin Continue to monitor H&H and platelets  Isac Sarna, BS [place], BCPS Clinical Pharmacist Pager [phone] 03/01/2017,11:02 AM

## 2017-03-01 NOTE — Consult Note (Signed)
Cardiology Consultation:   Patient ID: NATASSJA OLLIS; [id number]; [birth year]   Admit date: 02/28/2017 Date of Consult: 03/01/2017  Primary Care Provider: Caryl Bis, MD Consulting Cardiologist: Dr. Satira Sark   Patient Profile:   Kristen Mckenzie is a 76 y.o. female with a history of hypertension who is being seen today for the evaluation of SVT and abnormal troponin levels at the request of Dr. Wynetta Emery.  History of Present Illness:   Ms. Grantham presents to the hospital stating that she began to experience a sense of rapid heart rate in the early morning hours on the day of presentation when she had gotten up to go to the bathroom.  She felt discomfort in her chest and shortness of breath with very has a fast heartbeat.  Later in the morning she checked her blood pressure and her heart monitor indicated a heart rate in the "170s" she states that she has had previous episodes of palpitations, one that occurred in September when she had been working outdoors, and another occurred when she was under a lot of stress.  She has never had syncope.  She describes sudden onset of palpitations and generally a sudden offset of symptoms.  She was evaluated at [facility], was felt to have SVT and given adenosine with significant pauses but no conversion.  Rhythm looks to be most likely an atrial tachycardia with 2:1 block based on available strips.  Cannot completely exclude an atypical atrial flutter.  Her CHADSVASC score is 4.  She and her daughter present today both mention that they have both been under a lot of stress.  She is grieving following the loss of her husband of 72 years earlier this year.  He was a heart failure patient with LVAD in place.  Past Medical History:  Diagnosis Date  . Arthritis   . Cholecystolithiasis   . Hypertension     History reviewed. No pertinent surgical history.   Inpatient Medications: Scheduled Meds: . aspirin EC  81 mg Oral Daily  .  atorvastatin  40 mg Oral q1800  . lisinopril  2.5 mg Oral Daily   Continuous Infusions: . heparin 700 Units/hr (02/28/17 1803)   PRN Meds: acetaminophen, gi cocktail, morphine injection, ondansetron (ZOFRAN) IV  Allergies:    Allergies  Allergen Reactions  . Depakote [Valproic Acid] Hives and Itching    Social History:   Social History   Socioeconomic History  . Marital status: Married    Spouse name: Not on file  . Number of children: Not on file  . Years of education: Not on file  . Highest education level: Not on file  Social Needs  . Financial resource strain: Not on file  . Food insecurity - worry: Not on file  . Food insecurity - inability: Not on file  . Transportation needs - medical: Not on file  . Transportation needs - non-medical: Not on file  Occupational History  . Occupation: retired  Tobacco Use  . Smoking status: Never Smoker  . Smokeless tobacco: Never Used  Substance and Sexual Activity  . Alcohol use: No    Frequency: Never  . Drug use: No  . Sexual activity: Not on file  Other Topics Concern  . Not on file  Social History Narrative  . Not on file    Family History:   [name] family history includes Hypertension in her father.  ROS:  Please see the history of present illness.  All other ROS  reviewed and negative.     Physical Exam/Data:   Vitals:   02/28/17 2233 03/01/17 0450 03/01/17 0800 03/01/17 0852  BP: (!) 111/52 130/66 120/60   Pulse: 74 69 64   Resp: 18 18 18    Temp: 98.2 F (36.8 C) 98.1 F (36.7 C) 98 F (36.7 C)   TempSrc: Oral Oral Oral   SpO2: 96% 97% 98% 97%  Weight: 124 lb (56.2 kg)     Height: 5\' 5"  (1.651 m)       Intake/Output Summary (Last 24 hours) at 03/01/2017 1100 Last data filed at 03/01/2017 0900 Gross per 24 hour  Intake 0 ml  Output -  Net 0 ml   Filed Weights   02/28/17 1700 02/28/17 1717 02/28/17 2233  Weight: 124 lb (56.2 kg) 124 lb (56.2 kg) 124 lb (56.2 kg)   Body mass index is  20.63 kg/m.   Gen: Elderly woman, appears comfortable at rest. HEENT: Conjunctiva and lids normal, oropharynx clear. Neck: Supple, no elevated JVP or carotid bruits, no thyromegaly. Lungs: Clear to auscultation, nonlabored breathing at rest. Cardiac: Regular rate and rhythm, no S3, soft systolic murmur, no pericardial rub. Abdomen: Soft, nontender, bowel sounds present, no guarding or rebound. Extremities: No pitting edema, distal pulses 2+. Skin: Warm and dry. Musculoskeletal: No kyphosis. Neuropsychiatric: Alert and oriented x3, affect grossly appropriate.  EKG:  I personally reviewed the tracing from earlier this morning which shows sinus rhythm with borderline low voltage and PAC.  Telemetry:  I personally reviewed telemetry which shows sinus rhythm.  Relevant CV Studies:  Echocardiogram 03/01/2017: Study Conclusions  - Left ventricle: The cavity size was normal. Wall thickness was   increased in a pattern of mild LVH. Systolic function was normal.   The estimated ejection fraction was in the range of 60% to 65%.   Wall motion was normal; there were no regional wall motion   abnormalities. Features are consistent with a pseudonormal left   ventricular filling pattern, with concomitant abnormal relaxation   and increased filling pressure (grade 2 diastolic dysfunction). - Aortic valve: Mildly calcified annulus. Trileaflet. - Mitral valve: Mildly to moderately calcified annulus. There was   trivial regurgitation. - Left atrium: The atrium was at the upper limits of normal in   size. - Right atrium: Central venous pressure (est): 8 mm Hg. - Atrial septum: No defect or patent foramen ovale was identified. - Tricuspid valve: There was mild-moderate regurgitation. - Pulmonary arteries: PA peak pressure: 40 mm Hg (S). - Pericardium, extracardiac: There was no pericardial effusion.  Impressions:  - Mild LVH with LVEF 90-24%OXB grade 2 diastolic dysfunction. Mild   to  moderate mitral annular calcification with trivial mitral   regurgitation. Upper normal left atrial chamber size. Mild to   moderate tricuspid regurgitation with estimated PASP 40 mmHg.  Laboratory Data:  Chemistry Recent Labs  Lab 02/28/17 1545  NA 137  K 3.8  CL 99*  CO2 26  GLUCOSE 118*  BUN 16  CREATININE 0.68  CALCIUM 9.9  GFRNONAA >60  GFRAA >60  ANIONGAP 12     Hematology Recent Labs  Lab 02/28/17 1545  WBC 11.4*  RBC 4.79  HGB 15.6*  HCT 45.5  MCV 95.0  MCH 32.6  MCHC 34.3  RDW 13.5  PLT 243   Cardiac Enzymes Recent Labs  Lab 02/28/17 1545 03/01/17 0018 03/01/17 0458 03/01/17 0954  TROPONINI 0.40* 0.44* 0.36* 0.30*   No results for input(s): TROPIPOC in the last 168 hours.  Radiology/Studies:  Dg Chest 2 View  Result Date: 02/28/2017 CLINICAL DATA:  Atrial fibrillation. Tachycardia. Chest pain since yesterday. EXAM: CHEST  2 VIEW COMPARISON:  01/31/2017. FINDINGS: The heart size and mediastinal contours are within normal limits. Both lungs are clear. The visualized skeletal structures are unremarkable. Compared with priors, improved aeration. IMPRESSION: No active cardiopulmonary disease. Electronically Signed   By: Staci Righter M.D.   On: 02/28/2017 17:46    Assessment and Plan:   1.  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  She has had no recurrence under observation.  She reports a history of recurring sudden onset and offset palpitations suggesting paroxysmal arrhythmia.  I cannot entirely exclude an atypical atrial flutter.  2.  History of essential hypertension.  She has been on low-dose lisinopril as an outpatient.  Blood pressure is well controlled under observation.  3.  Mild troponin I elevation in a relatively flat pattern, 0.03-0.04.  Suspect that this is most likely related to demand ischemia in the setting of rapid heart rate rather  than ACS.  4.  Situational stress and grieving following the loss of her husband earlier this year.  I reviewed the patient's available telemetry strips and ECGs.  Discussed the situation with the patient and her daughter present.  At this point most likely diagnosis of his ectopic atrial tachycardia.  Plan is to initiate Cardizem CD 120 mg daily, stop lisinopril, follow blood pressure trend to make sure that it does not escalate.  She will take a baby aspirin daily for now.  We will arrange an outpatient Lexiscan Myoview for ischemic assessment, although as noted above doubt ACS.  LVEF is normal by echocardiogram done today without wall motion abnormalities.  Office follow-up will be arranged within the next few weeks.  Depending on symptom control and recurrence, EP consultation can also be considered.  Anticipate discharge home today.  Signed, Rozann Lesches, MD  03/01/2017 11:00 AM

## 2017-03-01 NOTE — Discharge Instructions (Signed)
Follow with Primary MD  Caryl Bis, MD  and other consultant's as instructed your Hospitalist MD  Please get a complete blood count and chemistry panel checked by your Primary MD at your next visit, and again as instructed by your Primary MD.  Get Medicines reviewed and adjusted: Please take all your medications with you for your next visit with your Primary MD  Laboratory/radiological data: Please request your Primary MD to go over all hospital tests and procedure/radiological results at the follow up, please ask your Primary MD to get all Hospital records sent to his/her office.  In some cases, they will be blood work, cultures and biopsy results pending at the time of your discharge. Please request that your primary care M.D. follows up on these results.  Also Note the following: If you experience worsening of your admission symptoms, develop shortness of breath, life threatening emergency, suicidal or homicidal thoughts you must seek medical attention immediately by calling 911 or calling your MD immediately  if symptoms less severe.  You must read complete instructions/literature along with all the possible adverse reactions/side effects for all the Medicines you take and that have been prescribed to you. Take any new Medicines after you have completely understood and accpet all the possible adverse reactions/side effects.   Do not drive when taking Pain medications or sleeping medications (Benzodaizepines)  Do not take more than prescribed Pain, Sleep and Anxiety Medications. It is not advisable to combine anxiety,sleep and pain medications without talking with your primary care practitioner  Special Instructions: If you have smoked or chewed Tobacco  in the last 2 yrs please stop smoking, stop any regular Alcohol  and or any Recreational drug use.  Wear Seat belts while driving.  Please note: You were cared for by a hospitalist during your hospital stay. Once you are discharged,  your primary care physician will handle any further medical issues. Please note that NO REFILLS for any discharge medications will be authorized once you are discharged, as it is imperative that you return to your primary care physician (or establish a relationship with a primary care physician if you do not have one) for your post hospital discharge needs so that they can reassess your need for medications and monitor your lab values.

## 2017-03-01 NOTE — Care Management Obs Status (Signed)
MEDICARE OBSERVATION STATUS NOTIFICATION   Patient Details  Name: Kristen Mckenzie MRN: [id number] Date of Birth: May 12, 1940   Medicare Observation Status Notification Given:  Yes    Sherald Barge, RN 03/01/2017, 12:32 PM

## 2017-03-01 NOTE — Progress Notes (Signed)
PROGRESS NOTE    Kristen Mckenzie  NTI:[id number]  DOB: [birth year]  DOA: 02/28/2017 PCP: Caryl Bis, MD   Brief Admission Hx: Kristen Mckenzie is a 76 y.o. female with medical history significant of HTN presenting with tachycardia.   MDM/Assessment & Plan:   1. SVT - acute - Pt was given cardizem in ED and it resolved. She was started on IV heparin. Echo pending. Cardiology consult requested.   2. NSTEMI - likely secondary to demand ischemia from acute tachycardia, troponin trending down now.  Follow on telemetry.  3. Essential Hypertension - BP currently very well controlled.  4. Hyperglycemia - A1c pending.  5. Dyslipidemia - LDL is 153 and TC is 222.  Will start atorvastatin 40 mg daily.    DVT prophylaxis: heparin Code Status: Full  Family Communication: bedside Disposition Plan: TBD   Consultants:  cardiology   Subjective: Pt denies palpitations and denies chest pressure or pain.    Objective: Vitals:   02/28/17 2233 03/01/17 0450 03/01/17 0800 03/01/17 0852  BP: (!) 111/52 130/66 120/60   Pulse: 74 69 64   Resp: 18 18 18    Temp: 98.2 F (36.8 C) 98.1 F (36.7 C) 98 F (36.7 C)   TempSrc: Oral Oral Oral   SpO2: 96% 97% 98% 97%  Weight: 56.2 kg (124 lb)     Height: 5\' 5"  (1.651 m)       Intake/Output Summary (Last 24 hours) at 03/01/2017 0949 Last data filed at 03/01/2017 0900 Gross per 24 hour  Intake 0 ml  Output -  Net 0 ml   Filed Weights   02/28/17 1700 02/28/17 1717 02/28/17 2233  Weight: 56.2 kg (124 lb) 56.2 kg (124 lb) 56.2 kg (124 lb)     REVIEW OF SYSTEMS  As per history otherwise all reviewed and reported negative  Exam:  General exam: awake, alert, NAD. Cooperative.  Respiratory system: Clear. No increased work of breathing. Cardiovascular system: normal S1 & S2 heard. No JVD, murmurs, gallops, clicks or pedal edema. Gastrointestinal system: Abdomen is nondistended, soft and nontender. Normal bowel sounds heard. Central  nervous system: Alert and oriented. No focal neurological deficits. Extremities: no CCE.  Data Reviewed: Basic Metabolic Panel: Recent Labs  Lab 02/28/17 1545  NA 137  K 3.8  CL 99*  CO2 26  GLUCOSE 118*  BUN 16  CREATININE 0.68  CALCIUM 9.9   Liver Function Tests: No results for input(s): AST, ALT, ALKPHOS, BILITOT, PROT, ALBUMIN in the last 168 hours. No results for input(s): LIPASE, AMYLASE in the last 168 hours. No results for input(s): AMMONIA in the last 168 hours. CBC: Recent Labs  Lab 02/28/17 1545  WBC 11.4*  NEUTROABS 8.0*  HGB 15.6*  HCT 45.5  MCV 95.0  PLT 243   Cardiac Enzymes: Recent Labs  Lab 02/28/17 1545 03/01/17 0018 03/01/17 0458  TROPONINI 0.40* 0.44* 0.36*   CBG (last 3)  No results for input(s): GLUCAP in the last 72 hours. No results found for this or any previous visit (from the past 240 hour(s)).   Studies: Dg Chest 2 View  Result Date: 02/28/2017 CLINICAL DATA:  Atrial fibrillation. Tachycardia. Chest pain since yesterday. EXAM: CHEST  2 VIEW COMPARISON:  01/31/2017. FINDINGS: The heart size and mediastinal contours are within normal limits. Both lungs are clear. The visualized skeletal structures are unremarkable. Compared with priors, improved aeration. IMPRESSION: No active cardiopulmonary disease. Electronically Signed   By: Staci Righter M.D.   On: 02/28/2017  17:46   Scheduled Meds: . aspirin EC  81 mg Oral Daily  . lisinopril  2.5 mg Oral Daily   Continuous Infusions: . heparin 700 Units/hr (02/28/17 1803)    Principal Problem:   Tachycardia Active Problems:   Essential hypertension   Acute hyperglycemia   Prolonged grief reaction   Time spent:   Irwin Brakeman, MD, FAAFP [facility] Pager [phone]  If 7PM-7AM, please contact night-coverage [email or web address] Password [email or web address] 03/01/2017, 9:49 AM    LOS: 0 days

## 2017-03-01 NOTE — Progress Notes (Signed)
ANTICOAGULATION CONSULT NOTE - follow up  Pharmacy Consult for Heparin Indication: chest pain/ACS  Allergies  Allergen Reactions  . Depakote [Valproic Acid] Hives and Itching    Patient Measurements: Height: 5\' 5"  (165.1 cm) Weight: 124 lb (56.2 kg) IBW/kg (Calculated) : 57 HEPARIN DW (KG): 56.2  Vital Signs: Temp: 98.1 F (36.7 C) (11/07 0450) Temp Source: Oral (11/07 0450) BP: 130/66 (11/07 0450) Pulse Rate: 69 (11/07 0450)  Labs: Recent Labs    02/28/17 1545 02/28/17 1712 03/01/17 0018 03/01/17 0458  HGB 15.6*  --   --   --   HCT 45.5  --   --   --   PLT 243  --   --   --   LABPROT  --  13.2  --   --   INR  --  1.01  --   --   HEPARINUNFRC  --   --  0.42  --   CREATININE 0.68  --   --   --   TROPONINI 0.40*  --  0.44* 0.36*    Estimated Creatinine Clearance: 52.2 mL/min (by C-G formula based on SCr of 0.68 mg/dL).   Medical History: Past Medical History:  Diagnosis Date  . Arthritis   . Cholecystolithiasis   . Hypertension     Medications:  See med rec Assessment: 76 yo female presents to ED with chest pain. Patient reports a rapid heart rate and aching chest pain since last night.  Troponins elevated but now trending down. Heparin level is therapeutic.  Goal of Therapy:  Heparin level 0.3-0.7 units/ml Monitor platelets by anticoagulation protocol: Yes   Plan:  Continue heparin infusion at 700 units/hr Check anti-Xa level in 6-8 hours hours and daily while on heparin Continue to monitor H&H and platelets  Isac Sarna, BS Vena Austria, BCPS Clinical Pharmacist Pager [phone] 03/01/2017,7:38 AM

## 2017-03-01 NOTE — Progress Notes (Signed)
*  PRELIMINARY RESULTS* Echocardiogram 2D Echocardiogram has been performed.  Kristen Mckenzie 03/01/2017, 9:48 AM

## 2017-03-01 NOTE — Discharge Summary (Signed)
Physician Discharge Summary  Kristen Mckenzie ZJI:[id number] DOB: 10/11/1940 DOA: 02/28/2017  PCP: Caryl Bis, MD  Admit date: 02/28/2017 Discharge date: 03/01/2017  Admitted From: Home  Disposition: Home   Recommendations for Outpatient Follow-up:  1. Follow up with PCP in 1 weeks 2. Follow up with cardiology in 2-3 weeks  Discharge Condition: STABLE   CODE STATUS: FULL    Brief Hospitalization Summary: Please see all hospital notes, images, labs for full details of the hospitalization. From HPI: Kristen Mckenzie is a 76 y.o. female with medical history significant of HTN presenting with tachycardia.  She reports that her heart rate was high and her BP was low.  She checks her BP several times a week.  She didn't feel right this AM, lightheaded and dizzy, and so she checked her BP; it was 76/- and HR was 154.  She rechecked 2 hours later and it was 80/- and HR was 160.  She called her PCP and was told to come to the ER.  No chest pain.  No SOB.  She has had this several times before.    Her husband of 29 years died 72 months ago and she is still struggling.    ED Course: EKG with SVT 160-170, stable BP.  Given 6 mg Adenosine and she had a 5-6 second pause.  Dr. Domenic Polite suggested Cardizem.  She was given 10 mg IV Cardizem prior to drip and converted to NSR rate 80.  Troponin 0.40.  Cardiology recommends heparin; ASA; Echo.  Brief Admission Hx: Kristen Hillmer Robertsonis a 76 y.o.femalewith medical history significant ofHTN presenting with tachycardia.   MDM/Assessment & Plan:   1. Atrial Tachycardia - acute - Pt was given cardizem in ED and it resolved.  She was seen by cardiology service and they have started on cardizem 120 mg daily.  DC lisinopril.  Aspirin 81 mg daily recommended. Outpatient lexiscan recommended and outpatient cardiology follow up.  Ok to discharge home with follow up.    2. Elevated troponin - likely secondary to demand ischemia from acute tachycardia, troponin  trending down now.  Followed on telemetry.  3. Essential Hypertension - BP currently very well controlled.  4. Hyperglycemia - A1c pending.  5. Dyslipidemia - LDL is 153 and TC is 222.  Will start atorvastatin 40 mg daily.    DVT prophylaxis: heparin Code Status: Full  Family Communication: bedside Disposition Plan: Home today   Consultants:  cardiology  Discharge Diagnoses:  Principal Problem:   Tachycardia Active Problems:   Essential hypertension   Acute hyperglycemia   Prolonged grief reaction  Discharge Instructions: Discharge Instructions    Call MD for:  difficulty breathing, headache or visual disturbances   Complete by:  As directed    Call MD for:  extreme fatigue   Complete by:  As directed    Call MD for:  persistant dizziness or light-headedness   Complete by:  As directed    Call MD for:  severe uncontrolled pain   Complete by:  As directed    Diet - low sodium heart healthy   Complete by:  As directed    Increase activity slowly   Complete by:  As directed      Allergies as of 03/01/2017      Reactions   Depakote [valproic Acid] Hives, Itching      Medication List    STOP taking these medications   lisinopril 5 MG tablet Commonly known as:  PRINIVIL,ZESTRIL  TAKE these medications   aspirin 81 MG EC tablet Take 1 tablet (81 mg total) daily by mouth. Start taking on:  03/02/2017   atorvastatin 40 MG tablet Commonly known as:  LIPITOR Take 1 tablet (40 mg total) daily at 6 PM by mouth.   B-COMPLEX-C PO Take 1 tablet daily by mouth.   CALCARB 600/D PO Take by mouth.   cholecalciferol 1000 units tablet Commonly known as:  VITAMIN D Take 1,000 Units daily by mouth.   COD LIVER OIL PO Take 1 capsule daily by mouth.   diltiazem 120 MG 24 hr capsule Commonly known as:  CARDIZEM CD Take 1 capsule (120 mg total) daily by mouth.   multivitamin with minerals Tabs tablet Take 1 tablet daily by mouth.   Potassium 99 MG Tabs Take  1 tablet 3 (three) times a week by mouth.   vitamin A 10000 UNIT capsule Take 10,000 Units daily by mouth.   vitamin C 500 MG tablet Commonly known as:  ASCORBIC ACID Take 500 mg daily by mouth.   vitamin E 400 UNIT capsule Take 400 Units daily by mouth.      Follow-up Information    Caryl Bis, MD. Schedule an appointment as soon as possible for a visit in 1 week(s).   Specialty:  Family Medicine Contact information: [place] [address] [phone]        Satira Sark, MD. Schedule an appointment as soon as possible for a visit in 2 week(s).   Specialty:  Cardiology Why:  hospital Follow up  Contact information: [place] [address] [phone]          Allergies  Allergen Reactions  . Depakote [Valproic Acid] Hives and Itching   Current Discharge Medication List    START taking these medications   Details  aspirin EC 81 MG EC tablet Take 1 tablet (81 mg total) daily by mouth.    atorvastatin (LIPITOR) 40 MG tablet Take 1 tablet (40 mg total) daily at 6 PM by mouth. Qty: 30 tablet, Refills: 0    diltiazem (CARDIZEM CD) 120 MG 24 hr capsule Take 1 capsule (120 mg total) daily by mouth. Qty: 30 capsule, Refills: 0      CONTINUE these medications which have NOT CHANGED   Details  B-COMPLEX-C PO Take 1 tablet daily by mouth.    Calcium Carbonate-Vitamin D (CALCARB 600/D PO) Take by mouth.    cholecalciferol (VITAMIN D) 1000 units tablet Take 1,000 Units daily by mouth.    COD LIVER OIL PO Take 1 capsule daily by mouth.    Multiple Vitamin (MULTIVITAMIN WITH MINERALS) TABS tablet Take 1 tablet daily by mouth.    Potassium 99 MG TABS Take 1 tablet 3 (three) times a week by mouth.    vitamin A 10000 UNIT capsule Take 10,000 Units daily by mouth.    vitamin C (ASCORBIC ACID) 500 MG tablet Take 500 mg daily by mouth.    vitamin E 400 UNIT capsule Take 400 Units daily by mouth.      STOP taking these  medications     lisinopril (PRINIVIL,ZESTRIL) 5 MG tablet         Procedures/Studies: Dg Chest 2 View  Result Date: 02/28/2017 CLINICAL DATA:  Atrial fibrillation. Tachycardia. Chest pain since yesterday. EXAM: CHEST  2 VIEW COMPARISON:  01/31/2017. FINDINGS: The heart size and mediastinal contours are within normal limits. Both lungs are clear. The visualized skeletal structures are unremarkable.  Compared with priors, improved aeration. IMPRESSION: No active cardiopulmonary disease. Electronically Signed   By: Staci Righter M.D.   On: 02/28/2017 17:46      Subjective: Pt says she feels much better.   Discharge Exam: Vitals:   03/01/17 0800 03/01/17 0852  BP: 120/60   Pulse: 64   Resp: 18   Temp: 98 F (36.7 C)   SpO2: 98% 97%   Vitals:   02/28/17 2233 03/01/17 0450 03/01/17 0800 03/01/17 0852  BP: (!) 111/52 130/66 120/60   Pulse: 74 69 64   Resp: 18 18 18    Temp: 98.2 F (36.8 C) 98.1 F (36.7 C) 98 F (36.7 C)   TempSrc: Oral Oral Oral   SpO2: 96% 97% 98% 97%  Weight: 56.2 kg (124 lb)     Height: 5\' 5"  (1.651 m)       General: Pt is alert, awake, not in acute distress Cardiovascular: RRR, S1/S2 +, no rubs, no gallops Respiratory: CTA bilaterally, no wheezing, no rhonchi Abdominal: Soft, NT, ND, bowel sounds + Extremities: no edema, no cyanosis   The results of significant diagnostics from this hospitalization (including imaging, microbiology, ancillary and laboratory) are listed below for reference.     Microbiology: No results found for this or any previous visit (from the past 240 hour(s)).   Labs: BNP (last 3 results) No results for input(s): BNP in the last 8760 hours. Basic Metabolic Panel: Recent Labs  Lab 02/28/17 1545  NA 137  K 3.8  CL 99*  CO2 26  GLUCOSE 118*  BUN 16  CREATININE 0.68  CALCIUM 9.9   Liver Function Tests: No results for input(s): AST, ALT, ALKPHOS, BILITOT, PROT, ALBUMIN in the last 168 hours. No results for  input(s): LIPASE, AMYLASE in the last 168 hours. No results for input(s): AMMONIA in the last 168 hours. CBC: Recent Labs  Lab 02/28/17 1545  WBC 11.4*  NEUTROABS 8.0*  HGB 15.6*  HCT 45.5  MCV 95.0  PLT 243   Cardiac Enzymes: Recent Labs  Lab 02/28/17 1545 03/01/17 0018 03/01/17 0458 03/01/17 0954  TROPONINI 0.40* 0.44* 0.36* 0.30*   BNP: Invalid input(s): POCBNP CBG: No results for input(s): GLUCAP in the last 168 hours. D-Dimer No results for input(s): DDIMER in the last 72 hours. Hgb A1c No results for input(s): HGBA1C in the last 72 hours. Lipid Profile Recent Labs    03/01/17 0458  CHOL 222*  HDL 47  LDLCALC 153*  TRIG 109  CHOLHDL 4.7   Thyroid function studies Recent Labs    02/28/17 1940  TSH 3.521   Anemia work up No results for input(s): VITAMINB12, FOLATE, FERRITIN, TIBC, IRON, RETICCTPCT in the last 72 hours. Urinalysis No results found for: COLORURINE, APPEARANCEUR, LABSPEC, [place], GLUCOSEU, HGBUR, BILIRUBINUR, KETONESUR, PROTEINUR, UROBILINOGEN, NITRITE, LEUKOCYTESUR Sepsis Labs Invalid input(s): PROCALCITONIN,  WBC,  LACTICIDVEN Microbiology No results found for this or any previous visit (from the past 240 hour(s)).  Time coordinating discharge:   SIGNED:  Irwin Brakeman, MD  [facility] 03/01/2017, 11:19 AM Pager [phone]  If 7PM-7AM, please contact night-coverage [email or web address] Password [email or web address]

## 2017-03-01 NOTE — Care Management CC44 (Signed)
Condition Code 44 Documentation Completed  Patient Details  Name: Kristen Mckenzie MRN: [id number] Date of Birth: 1940/08/01   Condition Code 44 given:  Yes Patient signature on Condition Code 44 notice:  Yes Documentation of 2 MD's agreement:  Yes Code 44 added to claim:  Yes    Sherald Barge, RN 03/01/2017, 12:32 PM

## 2017-03-06 ENCOUNTER — Telehealth: Payer: Self-pay

## 2017-03-06 MED ORDER — LISINOPRIL 5 MG PO TABS: 2.5000 mg | ORAL_TABLET | Freq: Every day | ORAL | 3 refills | Status: DC

## 2017-03-06 NOTE — Telephone Encounter (Signed)
Patient contacted office stating her BP today and yesterday were high and is very concerned her HR is not coming down with new medications. BP yesterday was 166/86 HR 111 BP today 145/81 HR 113. Patient reports she is still having some lightheadedness, but denies any chest pain, shortness of breath or syncope. Patient states she is constantly having to urinate (10+) times during the day and (4+) times at night. Patient is worried this is dehydrating her and that is what is causing her HR to not stabilize. Patient does not take a fluid pill. Patient has not noticed a change in weight. Advised patient I would send to Dr. Domenic Polite for further recommendation.

## 2017-03-06 NOTE — Telephone Encounter (Signed)
Patient notified and verbalized understanding. 

## 2017-03-06 NOTE — Telephone Encounter (Signed)
Would have her resume lisinopril 2.5 mg daily which she had been taking previously.  We can always increase the dose of Cardizem CD, but it would be good if we could see her back in the office first since this was just started.  As far as the frequent urination, I doubt that this is specifically cardiac related.  She may want to see her PCP to make sure that there is nothing else going on.

## 2017-03-06 NOTE — Telephone Encounter (Signed)
LMTCB

## 2017-03-07 ENCOUNTER — Other Ambulatory Visit: Payer: Self-pay

## 2017-03-07 DIAGNOSIS — R55 Syncope and collapse: Secondary | ICD-10-CM

## 2017-03-09 DIAGNOSIS — R509 Fever, unspecified: Secondary | ICD-10-CM | POA: Diagnosis not present

## 2017-03-09 DIAGNOSIS — Z6822 Body mass index (BMI) 22.0-22.9, adult: Secondary | ICD-10-CM | POA: Diagnosis not present

## 2017-03-11 DIAGNOSIS — D509 Iron deficiency anemia, unspecified: Secondary | ICD-10-CM | POA: Diagnosis not present

## 2017-03-11 DIAGNOSIS — Z6822 Body mass index (BMI) 22.0-22.9, adult: Secondary | ICD-10-CM | POA: Diagnosis not present

## 2017-03-11 DIAGNOSIS — R21 Rash and other nonspecific skin eruption: Secondary | ICD-10-CM | POA: Diagnosis not present

## 2017-03-11 DIAGNOSIS — D519 Vitamin B12 deficiency anemia, unspecified: Secondary | ICD-10-CM | POA: Diagnosis not present

## 2017-03-11 DIAGNOSIS — J111 Influenza due to unidentified influenza virus with other respiratory manifestations: Secondary | ICD-10-CM | POA: Diagnosis not present

## 2017-03-11 DIAGNOSIS — D529 Folate deficiency anemia, unspecified: Secondary | ICD-10-CM | POA: Diagnosis not present

## 2017-03-11 DIAGNOSIS — E039 Hypothyroidism, unspecified: Secondary | ICD-10-CM | POA: Diagnosis not present

## 2017-03-13 ENCOUNTER — Encounter (HOSPITAL_COMMUNITY): Payer: Medicare Other

## 2017-03-15 ENCOUNTER — Ambulatory Visit: Payer: Medicare Other | Admitting: Cardiology

## 2017-03-15 DIAGNOSIS — Z6822 Body mass index (BMI) 22.0-22.9, adult: Secondary | ICD-10-CM | POA: Diagnosis not present

## 2017-03-15 DIAGNOSIS — R21 Rash and other nonspecific skin eruption: Secondary | ICD-10-CM | POA: Diagnosis not present

## 2017-03-15 DIAGNOSIS — J159 Unspecified bacterial pneumonia: Secondary | ICD-10-CM | POA: Diagnosis not present

## 2017-03-23 ENCOUNTER — Encounter (HOSPITAL_BASED_OUTPATIENT_CLINIC_OR_DEPARTMENT_OTHER)
Admission: RE | Admit: 2017-03-23 | Discharge: 2017-03-23 | Disposition: A | Discharge status: Home / Self Care | Payer: Medicare Other | Source: Ambulatory Visit | Attending: Student | Admitting: Student

## 2017-03-23 ENCOUNTER — Encounter (HOSPITAL_COMMUNITY)
Admission: RE | Admit: 2017-03-23 | Discharge: 2017-03-23 | Disposition: A | Discharge status: Home / Self Care | Payer: Medicare Other | Source: Ambulatory Visit | Attending: Student | Admitting: Student

## 2017-03-23 ENCOUNTER — Encounter (HOSPITAL_COMMUNITY): Payer: Self-pay

## 2017-03-23 DIAGNOSIS — R55 Syncope and collapse: Secondary | ICD-10-CM | POA: Diagnosis not present

## 2017-03-23 HISTORY — DX: Systemic involvement of connective tissue, unspecified: M35.9

## 2017-03-23 LAB — NM MYOCAR MULTI W/SPECT W/WALL MOTION / EF
CHL CUP NUCLEAR SRS: 0
CHL CUP NUCLEAR SSS: 0
CSEPPHR: 97 {beats}/min
LHR: 0.24
LV dias vol: 36 mL (ref 46–106)
LV sys vol: 7 mL
Rest HR: 63 {beats}/min
SDS: 0
TID: 0.75

## 2017-03-23 MED ORDER — TECHNETIUM TC 99M TETROFOSMIN IV KIT
10.0000 | PACK | Freq: Once | INTRAVENOUS | Status: AC | PRN
  Administered 2017-03-23: 11 via INTRAVENOUS

## 2017-03-23 MED ORDER — REGADENOSON 0.4 MG/5ML IV SOLN
INTRAVENOUS | Status: AC
  Administered 2017-03-23: 0.4 mg via INTRAVENOUS
  Filled 2017-03-23: qty 5

## 2017-03-23 MED ORDER — TECHNETIUM TC 99M TETROFOSMIN IV KIT
30.0000 | PACK | Freq: Once | INTRAVENOUS | Status: AC | PRN
  Administered 2017-03-23: 32 via INTRAVENOUS

## 2017-03-23 MED ORDER — SODIUM CHLORIDE 0.9% FLUSH
INTRAVENOUS | Status: AC
Start: 2017-03-23 — End: 2017-03-23
  Administered 2017-03-23: 10 mL via INTRAVENOUS
  Filled 2017-03-23: qty 10

## 2017-03-27 ENCOUNTER — Encounter: Payer: Self-pay | Admitting: Cardiology

## 2017-03-27 ENCOUNTER — Ambulatory Visit (INDEPENDENT_AMBULATORY_CARE_PROVIDER_SITE_OTHER): Payer: Medicare Other | Admitting: Cardiology

## 2017-03-27 VITALS — BP 140/78 | HR 72 | Ht 64.0 in | Wt 126.0 lb

## 2017-03-27 DIAGNOSIS — I471 Supraventricular tachycardia: Secondary | ICD-10-CM | POA: Diagnosis not present

## 2017-03-27 DIAGNOSIS — I10 Essential (primary) hypertension: Secondary | ICD-10-CM

## 2017-03-27 MED ORDER — DILTIAZEM HCL ER COATED BEADS 120 MG PO CP24: 120.0000 mg | ORAL_CAPSULE | Freq: Every day | ORAL | 1 refills | Status: DC

## 2017-03-27 MED ORDER — DILTIAZEM HCL ER COATED BEADS 120 MG PO CP24: 120.0000 mg | ORAL_CAPSULE | Freq: Every day | ORAL | 3 refills | Status: DC

## 2017-03-27 NOTE — Patient Instructions (Signed)
Your physician recommends that you schedule a follow-up appointment in:  3 months with Dr.McDowell in the [facility] office.   I refilled a 30 day supply of Cardizem to [facility] PLUS a 90 day supply to [facility] mail order  Your physician recommends that you continue on your current medications as directed. Please refer to the Current Medication list given to you today.    No lab work or test ordered today.      Thank you for choosing [facility] !

## 2017-03-27 NOTE — Progress Notes (Signed)
Cardiology Office Note  Date: 03/27/2017   ID: Kristen Mckenzie, Kristen Mckenzie 01/08/1941, MRN [id number]  PCP: Caryl Bis, MD  Primary Cardiologist: Rozann Lesches, MD   Chief Complaint  Patient presents with  . Cardiac follow-up    History of Present Illness: Kristen Mckenzie is a 76 y.o. female that I saw in consultation recently with a possible ectopic atrial tachycardia.  This was associated with minor elevation in troponin I to 0.03 and 0.04, not consistent with ACS.  She was started on Cardizem CD 120 mg daily and continued on aspirin.  She presents today for follow-up.  She has had no palpitations or documented rapid heart rates based on review of her home blood pressure cuff.  She reports tolerating Cardizem CD.  She is now back on lisinopril at 2.5 mg daily for additional blood pressure control.  Echocardiogram in November revealed LVEF 60-65% without wall motion abnormalities and grade 2 diastolic dysfunction.  Lexiscan Myoview in November showed no evidence of ischemia.  I went over these results with her today.  Past Medical History:  Diagnosis Date  . Arthritis   . Cholecystolithiasis   . Collagen vascular disease ([place])   . Hypertension     History reviewed. No pertinent surgical history.  Current Outpatient Medications  Medication Sig Dispense Refill  . aspirin EC 81 MG EC tablet Take 1 tablet (81 mg total) daily by mouth.    Marland Kitchen atorvastatin (LIPITOR) 40 MG tablet Take 1 tablet (40 mg total) daily at 6 PM by mouth. 30 tablet 0  . B-COMPLEX-C PO Take 1 tablet daily by mouth.    . Calcium Carbonate-Vitamin D (CALCARB 600/D PO) Take by mouth.    . cholecalciferol (VITAMIN D) 1000 units tablet Take 1,000 Units daily by mouth.    . COD LIVER OIL PO Take 1 capsule daily by mouth.    . diltiazem (CARDIZEM CD) 120 MG 24 hr capsule Take 1 capsule (120 mg total) daily by mouth. 30 capsule 0  . lisinopril (PRINIVIL,ZESTRIL) 5 MG tablet Take 0.5 tablets (2.5 mg total) daily  by mouth. 45 tablet 3  . Multiple Vitamin (MULTIVITAMIN WITH MINERALS) TABS tablet Take 1 tablet daily by mouth.    . Potassium 99 MG TABS Take 1 tablet 3 (three) times a week by mouth.    . vitamin A 10000 UNIT capsule Take 10,000 Units daily by mouth.    . vitamin C (ASCORBIC ACID) 500 MG tablet Take 500 mg daily by mouth.    . vitamin E 400 UNIT capsule Take 400 Units daily by mouth.     No current facility-administered medications for this visit.    Allergies:  Depakote [valproic acid] and Tamiflu [oseltamivir phosphate]   Social History: The patient  reports that  has never smoked. she has never used smokeless tobacco. She reports that she does not drink alcohol or use drugs.   ROS:  Please see the history of present illness. Otherwise, complete review of systems is positive for grieving following the loss of her husband.  All other systems are reviewed and negative.   Physical Exam: VS:  BP 140/78 (BP Location: Right Arm)   Pulse 72   Ht 5\' 4"  (1.626 m)   Wt 126 lb (57.2 kg)   SpO2 97%   BMI 21.63 kg/m , BMI Body mass index is 21.63 kg/m.  Wt Readings from Last 3 Encounters:  03/27/17 126 lb (57.2 kg)  02/28/17 124 lb (56.2 kg)  General: Elderly woman, appears comfortable at rest. HEENT: Conjunctiva and lids normal, oropharynx clear. Neck: Supple, no elevated JVP or carotid bruits, no thyromegaly. Lungs: Clear to auscultation, nonlabored breathing at rest. Cardiac: Regular rate and rhythm, no S3 or significant systolic murmur, no pericardial rub. Abdomen: Soft, nontender, bowel sounds present. Extremities: No pitting edema, distal pulses 2+.  ECG: I personally reviewed the tracing from 02/28/2017 which showed sinus rhythm with PAC.  Recent Labwork: 02/28/2017: BUN 16; Creatinine, Ser 0.68; Potassium 3.8; Sodium 137; TSH 3.521 03/01/2017: Hemoglobin 11.9; Platelets 132     Component Value Date/Time   CHOL 222 (H) 03/01/2017 0458   TRIG 109 03/01/2017 0458   HDL 47  03/01/2017 0458   CHOLHDL 4.7 03/01/2017 0458   VLDL 22 03/01/2017 0458   LDLCALC 153 (H) 03/01/2017 0458    Other Studies Reviewed Today:  Echocardiogram 03/01/2017: Study Conclusions  - Left ventricle: The cavity size was normal. Wall thickness was   increased in a pattern of mild LVH. Systolic function was normal.   The estimated ejection fraction was in the range of 60% to 65%.   Wall motion was normal; there were no regional wall motion   abnormalities. Features are consistent with a pseudonormal left   ventricular filling pattern, with concomitant abnormal relaxation   and increased filling pressure (grade 2 diastolic dysfunction). - Aortic valve: Mildly calcified annulus. Trileaflet. - Mitral valve: Mildly to moderately calcified annulus. There was   trivial regurgitation. - Left atrium: The atrium was at the upper limits of normal in   size. - Right atrium: Central venous pressure (est): 8 mm Hg. - Atrial septum: No defect or patent foramen ovale was identified. - Tricuspid valve: There was mild-moderate regurgitation. - Pulmonary arteries: PA peak pressure: 40 mm Hg (S). - Pericardium, extracardiac: There was no pericardial effusion.  Impressions:  - Mild LVH with LVEF 27-03%JKK grade 2 diastolic dysfunction. Mild   to moderate mitral annular calcification with trivial mitral   regurgitation. Upper normal left atrial chamber size. Mild to   moderate tricuspid regurgitation with estimated PASP 40 mmHg.  Lexiscan Myoview 03/23/2017:  No diagnostic ST segment changes to indicate ischemia.  No significant myocardial perfusion defects to indicate scar or ischemia.  This is a low risk study.  Nuclear stress EF: 80%.  Assessment and Plan:  1.  Transient ectopic atrial tachycardia.  Plan at this time is to continue Cardizem CD 120 mg daily.  LVEF normal range and Myoview study did not indicate ischemia as discussed above.  2.  Essential hypertension, continue  low-dose lisinopril along with Cardizem CD.  She has a blood pressure cuff for home use.  Current medicines were reviewed with the patient today.    Disposition: Follow-up in the [place] office in 3 months.  Signed, Satira Sark, MD, [facility] 03/27/2017 4:12 PM    [facility] [place] [address], [place], [place] [address] Phone: [phone]; Fax: [phone]

## 2017-03-31 DIAGNOSIS — Z6822 Body mass index (BMI) 22.0-22.9, adult: Secondary | ICD-10-CM | POA: Diagnosis not present

## 2017-03-31 DIAGNOSIS — I471 Supraventricular tachycardia: Secondary | ICD-10-CM | POA: Diagnosis not present

## 2017-03-31 DIAGNOSIS — F331 Major depressive disorder, recurrent, moderate: Secondary | ICD-10-CM | POA: Diagnosis not present

## 2017-04-04 DIAGNOSIS — F331 Major depressive disorder, recurrent, moderate: Secondary | ICD-10-CM | POA: Diagnosis not present

## 2017-04-04 DIAGNOSIS — Z6821 Body mass index (BMI) 21.0-21.9, adult: Secondary | ICD-10-CM | POA: Diagnosis not present

## 2017-04-04 DIAGNOSIS — R002 Palpitations: Secondary | ICD-10-CM | POA: Diagnosis not present

## 2017-04-04 DIAGNOSIS — Z0001 Encounter for general adult medical examination with abnormal findings: Secondary | ICD-10-CM | POA: Diagnosis not present

## 2017-04-04 DIAGNOSIS — I10 Essential (primary) hypertension: Secondary | ICD-10-CM | POA: Diagnosis not present

## 2017-05-03 ENCOUNTER — Other Ambulatory Visit: Payer: Self-pay | Admitting: Cardiology

## 2017-05-17 DIAGNOSIS — N3946 Mixed incontinence: Secondary | ICD-10-CM | POA: Diagnosis not present

## 2017-05-17 DIAGNOSIS — R350 Frequency of micturition: Secondary | ICD-10-CM | POA: Diagnosis not present

## 2017-05-17 DIAGNOSIS — R351 Nocturia: Secondary | ICD-10-CM | POA: Diagnosis not present

## 2017-06-07 DIAGNOSIS — R350 Frequency of micturition: Secondary | ICD-10-CM | POA: Diagnosis not present

## 2017-06-07 DIAGNOSIS — N3946 Mixed incontinence: Secondary | ICD-10-CM | POA: Diagnosis not present

## 2017-06-07 DIAGNOSIS — R351 Nocturia: Secondary | ICD-10-CM | POA: Diagnosis not present

## 2017-06-08 DIAGNOSIS — E782 Mixed hyperlipidemia: Secondary | ICD-10-CM | POA: Diagnosis not present

## 2017-06-08 DIAGNOSIS — Z6821 Body mass index (BMI) 21.0-21.9, adult: Secondary | ICD-10-CM | POA: Diagnosis not present

## 2017-06-08 DIAGNOSIS — I10 Essential (primary) hypertension: Secondary | ICD-10-CM | POA: Diagnosis not present

## 2017-06-08 DIAGNOSIS — F331 Major depressive disorder, recurrent, moderate: Secondary | ICD-10-CM | POA: Diagnosis not present

## 2017-06-08 DIAGNOSIS — R002 Palpitations: Secondary | ICD-10-CM | POA: Diagnosis not present

## 2017-06-09 DIAGNOSIS — N3946 Mixed incontinence: Secondary | ICD-10-CM | POA: Diagnosis not present

## 2017-06-09 DIAGNOSIS — R350 Frequency of micturition: Secondary | ICD-10-CM | POA: Diagnosis not present

## 2017-06-27 NOTE — Progress Notes (Signed)
Cardiology Office Note  Date: 06/28/2017   ID: Kristen Mckenzie, Kristen Mckenzie [birth year], MRN [id number]  PCP: Caryl Bis, MD  Primary Cardiologist: Rozann Lesches, MD   Chief Complaint  Patient presents with  . Atrial tachycardia    History of Present Illness: Kristen Mckenzie is a 77 y.o. female last seen in December 2018.  She is here today with her daughter for a follow-up visit.  She tells me that she has had intermittent episodes of shortness of breath and vague palpitations.  On review of her home blood pressure monitor she has had heart rates in the 100-110 range intermittently, even within the last week.  We discussed the possibility that she is having recurring episodes of atrial tachycardia.  She has had no chest pain or syncope.  We went over her medications and discussed increasing Cardizem CD to 180 mg daily from 120 mg daily.  With this change we would have her temporarily hold lisinopril to watch blood pressure and make sure she does not have any hypotension on the higher dose.  She is still obviously grieving the loss of her husband, was tearful about this today.  This also represents significant stress for her.  Past Medical History:  Diagnosis Date  . Arthritis   . Cholecystolithiasis   . Collagen vascular disease ([place])   . Hypertension     History reviewed. No pertinent surgical history.  Current Outpatient Medications  Medication Sig Dispense Refill  . aspirin EC 81 MG EC tablet Take 1 tablet (81 mg total) daily by mouth.    Marland Kitchen atorvastatin (LIPITOR) 40 MG tablet Take 20 mg by mouth daily.    . B-COMPLEX-C PO Take 1 tablet daily by mouth.    . Calcium Carbonate-Vitamin D (CALCARB 600/D PO) Take by mouth.    . cholecalciferol (VITAMIN D) 1000 units tablet Take 1,000 Units daily by mouth.    . COD LIVER OIL PO Take 1 capsule daily by mouth.    Marland Kitchen lisinopril (PRINIVIL,ZESTRIL) 5 MG tablet Take 0.5 tablets (2.5 mg total) daily by mouth. 45 tablet 3  . Multiple  Vitamin (MULTIVITAMIN WITH MINERALS) TABS tablet Take 1 tablet daily by mouth.    . Potassium 99 MG TABS Take 1 tablet 3 (three) times a week by mouth.    . vitamin A 10000 UNIT capsule Take 10,000 Units daily by mouth.    . vitamin C (ASCORBIC ACID) 500 MG tablet Take 500 mg daily by mouth.    . vitamin E 400 UNIT capsule Take 400 Units daily by mouth.    . diltiazem (CARDIZEM CD) 180 MG 24 hr capsule Take 1 capsule (180 mg total) by mouth daily. 30 capsule 1   No current facility-administered medications for this visit.    Allergies:  Depakote [valproic acid] and Tamiflu [oseltamivir phosphate]   Social History: The patient  reports that  has never smoked. she has never used smokeless tobacco. She reports that she does not drink alcohol or use drugs.   ROS:  Please see the history of present illness. Otherwise, complete review of systems is positive for hearing loss.  All other systems are reviewed and negative.   Physical Exam: VS:  BP 135/74 (BP Location: Right Arm, Cuff Size: Normal) Comment: patient's machine  Pulse 90   Ht 5\' 4"  (1.626 m)   Wt 129 lb 6.4 oz (58.7 kg)   SpO2 97%   BMI 22.21 kg/m , BMI Body mass index is 22.21 kg/m.  Wt Readings from Last 3 Encounters:  06/28/17 129 lb 6.4 oz (58.7 kg)  03/27/17 126 lb (57.2 kg)  02/28/17 124 lb (56.2 kg)    General: Elderly woman, appears comfortable at rest. HEENT: Conjunctiva and lids normal, oropharynx clear. Neck: Supple, no elevated JVP or carotid bruits, no thyromegaly. Lungs: Clear to auscultation, nonlabored breathing at rest. Cardiac: Regular rate and rhythm, no S3 or significant systolic murmur, no pericardial rub. Abdomen: Soft, nontender, bowel sounds present. Extremities: No pitting edema, distal pulses 2+. Skin: Warm and dry. Musculoskeletal: No kyphosis. Neuropsychiatric: Alert and oriented x3, affect grossly appropriate.  ECG: I personally reviewed the tracing from 02/28/2017 which showed sinus rhythm  with PAC.  Recent Labwork: 02/28/2017: BUN 16; Creatinine, Ser 0.68; Potassium 3.8; Sodium 137; TSH 3.521 03/01/2017: Hemoglobin 11.9; Platelets 132     Component Value Date/Time   CHOL 222 (H) 03/01/2017 0458   TRIG 109 03/01/2017 0458   HDL 47 03/01/2017 0458   CHOLHDL 4.7 03/01/2017 0458   VLDL 22 03/01/2017 0458   LDLCALC 153 (H) 03/01/2017 0458    Other Studies Reviewed Today:  Echocardiogram 03/01/2017: Study Conclusions  - Left ventricle: The cavity size was normal. Wall thickness was increased in a pattern of mild LVH. Systolic function was normal. The estimated ejection fraction was in the range of 60% to 65%. Wall motion was normal; there were no regional wall motion abnormalities. Features are consistent with a pseudonormal left ventricular filling pattern, with concomitant abnormal relaxation and increased filling pressure (grade 2 diastolic dysfunction). - Aortic valve: Mildly calcified annulus. Trileaflet. - Mitral valve: Mildly to moderately calcified annulus. There was trivial regurgitation. - Left atrium: The atrium was at the upper limits of normal in size. - Right atrium: Central venous pressure (est): 8 mm Hg. - Atrial septum: No defect or patent foramen ovale was identified. - Tricuspid valve: There was mild-moderate regurgitation. - Pulmonary arteries: PA peak pressure: 40 mm Hg (S). - Pericardium, extracardiac: There was no pericardial effusion.  Impressions:  - Mild LVH with LVEF 53-64%WOE grade 2 diastolic dysfunction. Mild to moderate mitral annular calcification with trivial mitral regurgitation. Upper normal left atrial chamber size. Mild to moderate tricuspid regurgitation with estimated PASP 40 mmHg.  Lexiscan Myoview 03/23/2017:  No diagnostic ST segment changes to indicate ischemia.  No significant myocardial perfusion defects to indicate scar or ischemia.  This is a low risk study.  Nuclear stress EF:  80%.  Assessment and Plan:  1.  Paroxysmal ectopic atrial tachycardia.  She has had some recent symptoms associated with elevated heart rate in the 100-110 range.  For now our plan will be to increase Cardizem CD to 180 mg daily.  In the short-term at least we will hold lisinopril until we see her resulting blood pressure and heart rate trend.  2.  Essential hypertension, continue to track blood pressure at home.  Holding lisinopril temporarily as noted above.  3.  Grief reaction to the loss of her husband.  Current medicines were reviewed with the patient today.  Disposition: Follow-up in 4-6 weeks.  Signed, Satira Sark, MD, [facility] 06/28/2017 1:42 PM    [facility] [place], [place], [place] [address] Phone: [phone]; Fax: [phone]

## 2017-06-28 ENCOUNTER — Encounter: Payer: Self-pay | Admitting: Cardiology

## 2017-06-28 ENCOUNTER — Ambulatory Visit (INDEPENDENT_AMBULATORY_CARE_PROVIDER_SITE_OTHER): Payer: Medicare Other | Admitting: Cardiology

## 2017-06-28 VITALS — BP 135/74 | HR 90 | Ht 64.0 in | Wt 129.4 lb

## 2017-06-28 DIAGNOSIS — I471 Supraventricular tachycardia: Secondary | ICD-10-CM

## 2017-06-28 DIAGNOSIS — I10 Essential (primary) hypertension: Secondary | ICD-10-CM

## 2017-06-28 DIAGNOSIS — F4321 Adjustment disorder with depressed mood: Secondary | ICD-10-CM

## 2017-06-28 MED ORDER — DILTIAZEM HCL ER COATED BEADS 180 MG PO CP24: 180.0000 mg | ORAL_CAPSULE | Freq: Every day | ORAL | 1 refills | Status: DC

## 2017-06-28 NOTE — Patient Instructions (Addendum)
Medication Instructions:  Your physician has recommended you make the following change in your medication:    STOP Cardizem 120 mg   BEGIN Cardizem 180 mg daily   HOLD LISINOPRIL FOR NOW   Please continue all other medications as prescribed  Labwork: NONE  Testing/Procedures: NONE  Follow-Up: Your physician recommends that you schedule a follow-up appointment in: 4-6 [place]  Any Other Special Instructions Will Be Listed Below (If Applicable).  If you need a refill on your cardiac medications before your next appointment, please call your pharmacy.

## 2017-08-02 DIAGNOSIS — T783XXA Angioneurotic edema, initial encounter: Secondary | ICD-10-CM | POA: Diagnosis not present

## 2017-08-02 DIAGNOSIS — Z6822 Body mass index (BMI) 22.0-22.9, adult: Secondary | ICD-10-CM | POA: Diagnosis not present

## 2017-08-09 NOTE — Progress Notes (Signed)
Cardiology Office Note  Date: 08/10/2017   ID: Makalia, Bare [birth year], MRN [id number]  PCP: Caryl Bis, MD  Primary Cardiologist: Rozann Lesches, MD   Chief Complaint  Patient presents with  . Atrial tachycardia    History of Present Illness: Kristen Mckenzie is a 77 y.o. female seen recently in March.  She presents for follow-up.  At the last visit we increased Cardizem CD to 180 mg daily and held lisinopril.  She tells me that her palpitations have improved, not completely resolved but are very brief.  She has had no dizziness or syncope.  No chest pain.  We reviewed her home blood pressure and heart rate checks on her monitor today.  We have decided to keep her off lisinopril.  Past Medical History:  Diagnosis Date  . Arthritis   . Cholecystolithiasis   . Collagen vascular disease ([place])   . Hypertension     History reviewed. No pertinent surgical history.  Current Outpatient Medications  Medication Sig Dispense Refill  . aspirin EC 81 MG EC tablet Take 1 tablet (81 mg total) daily by mouth.    Marland Kitchen atorvastatin (LIPITOR) 40 MG tablet Take 20 mg by mouth daily.    . B-COMPLEX-C PO Take 1 tablet daily by mouth.    . Calcium Carbonate-Vitamin D (CALCARB 600/D PO) Take by mouth.    . cholecalciferol (VITAMIN D) 1000 units tablet Take 1,000 Units daily by mouth.    . COD LIVER OIL PO Take 1 capsule daily by mouth.    . diltiazem (CARDIZEM CD) 180 MG 24 hr capsule Take 1 capsule (180 mg total) by mouth daily. 90 capsule 3  . MAGNESIUM PO Take by mouth.    . Multiple Vitamin (MULTIVITAMIN WITH MINERALS) TABS tablet Take 1 tablet daily by mouth.    . Multiple Vitamins-Minerals (ZINC PO) Take by mouth.    . Potassium 99 MG TABS Take 1 tablet 3 (three) times a week by mouth.    . vitamin A 10000 UNIT capsule Take 10,000 Units daily by mouth.    . vitamin C (ASCORBIC ACID) 500 MG tablet Take 500 mg daily by mouth.    . vitamin E 400 UNIT capsule Take 400 Units  daily by mouth.     No current facility-administered medications for this visit.    Allergies:  Depakote [valproic acid]; Lisinopril; and Tamiflu [oseltamivir phosphate]   Social History: The patient  reports that she has never smoked. She has never used smokeless tobacco. She reports that she does not drink alcohol or use drugs.   ROS:  Please see the history of present illness. Otherwise, complete review of systems is positive for none.  All other systems are reviewed and negative.   Physical Exam: VS:  BP 122/64   Pulse 88   Ht 5\' 5"  (1.651 Kristen)   Wt 125 lb (56.7 kg)   SpO2 96%   BMI 20.80 kg/Kristen , BMI Body mass index is 20.8 kg/Kristen.  Wt Readings from Last 3 Encounters:  08/10/17 125 lb (56.7 kg)  06/28/17 129 lb 6.4 oz (58.7 kg)  03/27/17 126 lb (57.2 kg)    General: Elderly woman, appears comfortable at rest. HEENT: Conjunctiva and lids normal, oropharynx clear. Neck: Supple, no elevated JVP or carotid bruits, no thyromegaly. Lungs: Clear to auscultation, nonlabored breathing at rest. Cardiac: Regular rate and rhythm, no S3 or significant systolic murmur, no pericardial rub. Abdomen: Soft, nontender, bowel sounds present. Extremities: No pitting  edema, distal pulses 2+.  ECG: I personally reviewed the tracing from 03/01/2017 which showed sinus rhythm with PAC.  Recent Labwork: 02/28/2017: BUN 16; Creatinine, Ser 0.68; Potassium 3.8; Sodium 137; TSH 3.521 03/01/2017: Hemoglobin 11.9; Platelets 132     Component Value Date/Time   CHOL 222 (H) 03/01/2017 0458   TRIG 109 03/01/2017 0458   HDL 47 03/01/2017 0458   CHOLHDL 4.7 03/01/2017 0458   VLDL 22 03/01/2017 0458   LDLCALC 153 (H) 03/01/2017 0458    Other Studies Reviewed Today:  Echocardiogram11/10/2016: Study Conclusions  - Left ventricle: The cavity size was normal. Wall thickness was increased in a pattern of mild LVH. Systolic function was normal. The estimated ejection fraction was in the range of 60% to  65%. Wall motion was normal; there were no regional wall motion abnormalities. Features are consistent with a pseudonormal left ventricular filling pattern, with concomitant abnormal relaxation and increased filling pressure (grade 2 diastolic dysfunction). - Aortic valve: Mildly calcified annulus. Trileaflet. - Mitral valve: Mildly to moderately calcified annulus. There was trivial regurgitation. - Left atrium: The atrium was at the upper limits of normal in size. - Right atrium: Central venous pressure (est): 8 mm Hg. - Atrial septum: No defect or patent foramen ovale was identified. - Tricuspid valve: There was mild-moderate regurgitation. - Pulmonary arteries: PA peak pressure: 40 mm Hg (S). - Pericardium, extracardiac: There was no pericardial effusion.  Impressions:  - Mild LVH with LVEF 98-26%EBR grade 2 diastolic dysfunction. Mild to moderate mitral annular calcification with trivial mitral regurgitation. Upper normal left atrial chamber size. Mild to moderate tricuspid regurgitation with estimated PASP 40 mmHg.  Lexiscan Myoview 03/23/2017:  No diagnostic ST segment changes to indicate ischemia.  No significant myocardial perfusion defects to indicate scar or ischemia.  This is a low risk study.  Nuclear stress EF: 80%.  Assessment and Plan:  1.  Ectopic atrial tachycardia.  Palpitations have improved following increase in Cardizem CD to 180 mg daily.  We will continue with current plan.  She does have additional room for up titration if needed.  2.  Essential hypertension, will stay off lisinopril at this point.  Current medicines were reviewed with the patient today.  Disposition: Follow-up in 4 months.  Signed, Satira Sark, MD, [facility] 08/10/2017 3:45 PM    [facility] [place], [place],  [address] Phone: [phone]; Fax: [phone]

## 2017-08-10 ENCOUNTER — Encounter: Payer: Self-pay | Admitting: Cardiology

## 2017-08-10 ENCOUNTER — Ambulatory Visit (INDEPENDENT_AMBULATORY_CARE_PROVIDER_SITE_OTHER): Payer: Medicare Other | Admitting: Cardiology

## 2017-08-10 VITALS — BP 122/64 | HR 88 | Ht 65.0 in | Wt 125.0 lb

## 2017-08-10 DIAGNOSIS — I471 Supraventricular tachycardia: Secondary | ICD-10-CM | POA: Diagnosis not present

## 2017-08-10 DIAGNOSIS — I10 Essential (primary) hypertension: Secondary | ICD-10-CM | POA: Diagnosis not present

## 2017-08-10 MED ORDER — DILTIAZEM HCL ER COATED BEADS 180 MG PO CP24: 180.0000 mg | ORAL_CAPSULE | Freq: Every day | ORAL | 3 refills | Status: DC

## 2017-08-10 NOTE — Patient Instructions (Signed)
Medication Instructions:  Continue all current medications.  Labwork: none  Testing/Procedures: none  Follow-Up: 4 months   Any Other Special Instructions Will Be Listed Below (If Applicable).  If you need a refill on your cardiac medications before your next appointment, please call your pharmacy.\ 

## 2017-08-23 ENCOUNTER — Telehealth: Payer: Self-pay | Admitting: Cardiology

## 2017-08-23 NOTE — Telephone Encounter (Signed)
Pt says since starting diltiazem 180 mg daily has been "off balance" starting in late March - says this isn't all the time but when she 1st takes her medication and stands up. Says she has an inaccurate BP machine and doesn't know what BP has been running but says HR has been in the 80s - denies SOB/chest pain/swelling - took diltiazem 120 mg for 1 week and says this made her feel "more stable on her feet". Wanted to know if she should go back to diltiazem 120 mg or try to tolerate 180 mg

## 2017-08-23 NOTE — Telephone Encounter (Signed)
diltiazem (CARDIZEM CD) 180 MG 24 hr capsule   Has question about medication dose and symptoms.  Was light headed when she took 180 mg instead of 120mg 

## 2017-08-24 NOTE — Telephone Encounter (Signed)
When I saw her on April 18 she reported that she felt better in terms of palpitations on Cardizem CD 180 mg daily.  If in fact she is having lightheadedness on this dose, she can certainly cut back to 120 mg daily.

## 2017-09-19 DIAGNOSIS — Z6821 Body mass index (BMI) 21.0-21.9, adult: Secondary | ICD-10-CM | POA: Diagnosis not present

## 2017-09-19 DIAGNOSIS — J0100 Acute maxillary sinusitis, unspecified: Secondary | ICD-10-CM | POA: Diagnosis not present

## 2017-10-05 DIAGNOSIS — K210 Gastro-esophageal reflux disease with esophagitis: Secondary | ICD-10-CM | POA: Diagnosis not present

## 2017-10-05 DIAGNOSIS — R000 Tachycardia, unspecified: Secondary | ICD-10-CM | POA: Diagnosis not present

## 2017-10-05 DIAGNOSIS — F418 Other specified anxiety disorders: Secondary | ICD-10-CM | POA: Diagnosis not present

## 2017-10-05 DIAGNOSIS — F331 Major depressive disorder, recurrent, moderate: Secondary | ICD-10-CM | POA: Diagnosis not present

## 2017-10-05 DIAGNOSIS — E782 Mixed hyperlipidemia: Secondary | ICD-10-CM | POA: Diagnosis not present

## 2017-10-05 DIAGNOSIS — I480 Paroxysmal atrial fibrillation: Secondary | ICD-10-CM | POA: Diagnosis not present

## 2017-10-05 DIAGNOSIS — I10 Essential (primary) hypertension: Secondary | ICD-10-CM | POA: Diagnosis not present

## 2017-10-11 ENCOUNTER — Other Ambulatory Visit: Payer: Self-pay | Admitting: Cardiology

## 2017-10-12 DIAGNOSIS — F331 Major depressive disorder, recurrent, moderate: Secondary | ICD-10-CM | POA: Diagnosis not present

## 2017-10-12 DIAGNOSIS — E782 Mixed hyperlipidemia: Secondary | ICD-10-CM | POA: Diagnosis not present

## 2017-10-12 DIAGNOSIS — R002 Palpitations: Secondary | ICD-10-CM | POA: Diagnosis not present

## 2017-10-12 DIAGNOSIS — Z6822 Body mass index (BMI) 22.0-22.9, adult: Secondary | ICD-10-CM | POA: Diagnosis not present

## 2017-10-12 DIAGNOSIS — I10 Essential (primary) hypertension: Secondary | ICD-10-CM | POA: Diagnosis not present

## 2017-11-02 DIAGNOSIS — H35361 Drusen (degenerative) of macula, right eye: Secondary | ICD-10-CM | POA: Diagnosis not present

## 2017-11-14 DIAGNOSIS — Z6822 Body mass index (BMI) 22.0-22.9, adult: Secondary | ICD-10-CM | POA: Diagnosis not present

## 2017-11-14 DIAGNOSIS — M816 Localized osteoporosis [Lequesne]: Secondary | ICD-10-CM | POA: Diagnosis not present

## 2017-11-14 DIAGNOSIS — Z1231 Encounter for screening mammogram for malignant neoplasm of breast: Secondary | ICD-10-CM | POA: Diagnosis not present

## 2017-11-14 DIAGNOSIS — N958 Other specified menopausal and perimenopausal disorders: Secondary | ICD-10-CM | POA: Diagnosis not present

## 2017-11-14 DIAGNOSIS — Z01419 Encounter for gynecological examination (general) (routine) without abnormal findings: Secondary | ICD-10-CM | POA: Diagnosis not present

## 2017-12-08 ENCOUNTER — Other Ambulatory Visit: Payer: Self-pay | Admitting: Urology

## 2017-12-12 NOTE — Progress Notes (Signed)
Cardiology Office Note  Date: 12/13/2017   ID: Kristen Mckenzie, Kristen Mckenzie July 31, 1940, MRN [id number]  PCP: Caryl Bis, MD  Primary Cardiologist: Rozann Lesches, MD   Chief Complaint  Patient presents with  . Atrial tachycardia    History of Present Illness: Kristen Mckenzie is a 77 y.o. female last seen in April.  She is here for a routine follow-up visit.  Since I last saw her she decided to come off of Cardizem CD completely, stating that she felt weak and that her blood pressure was actually higher on the medication.  She did this back in late May and went on low-dose lisinopril for blood pressure control.  States that she feels better.  I went over her blood pressure and heart rate log from home.  She does have intermittent episodes of tachycardia, but for the most part her heart rate is under 100.  Past Medical History:  Diagnosis Date  . Arthritis   . Cholecystolithiasis   . Collagen vascular disease ([place])   . Ectopic atrial tachycardia ([place])   . Hypertension     History reviewed. No pertinent surgical history.  Current Outpatient Medications  Medication Sig Dispense Refill  . lisinopril (PRINIVIL,ZESTRIL) 5 MG tablet TAKE 1/2 TABLET BY MOUTH DAILY - 06/26/17 CS. VERIFIED DOSE WITH DR. 45 tablet 1  . aspirin EC 81 MG EC tablet Take 1 tablet (81 mg total) daily by mouth.    Marland Kitchen atorvastatin (LIPITOR) 40 MG tablet Take 20 mg by mouth daily.    . B-COMPLEX-C PO Take 1 tablet daily by mouth.    . Calcium Carbonate-Vitamin D (CALCARB 600/D PO) Take by mouth.    . cholecalciferol (VITAMIN D) 1000 units tablet Take 1,000 Units daily by mouth.    . COD LIVER OIL PO Take 1 capsule daily by mouth.    Marland Kitchen MAGNESIUM PO Take by mouth.    . Multiple Vitamin (MULTIVITAMIN WITH MINERALS) TABS tablet Take 1 tablet daily by mouth.    . Multiple Vitamins-Minerals (ZINC PO) Take by mouth.    . Potassium 99 MG TABS Take 1 tablet 3 (three) times a week by mouth.    . vitamin A 10000 UNIT  capsule Take 10,000 Units daily by mouth.    . vitamin C (ASCORBIC ACID) 500 MG tablet Take 500 mg daily by mouth.    . vitamin E 400 UNIT capsule Take 400 Units daily by mouth.     No current facility-administered medications for this visit.    Allergies:  Depakote [valproic acid]; Lisinopril; and Tamiflu [oseltamivir phosphate]   Social History: The patient  reports that she has never smoked. She has never used smokeless tobacco. She reports that she does not drink alcohol or use drugs.   ROS:  Please see the history of present illness. Otherwise, complete review of systems is positive for none.  All other systems are reviewed and negative.   Physical Exam: VS:  BP 133/70   Pulse 78   Ht 5\' 5"  (1.651 m)   Wt 126 lb 9.6 oz (57.4 kg)   SpO2 95%   BMI 21.07 kg/m , BMI Body mass index is 21.07 kg/m.  Wt Readings from Last 3 Encounters:  12/13/17 126 lb 9.6 oz (57.4 kg)  08/10/17 125 lb (56.7 kg)  06/28/17 129 lb 6.4 oz (58.7 kg)    General: Elderly woman, appears comfortable at rest. HEENT: Conjunctiva and lids normal, oropharynx clear. Neck: Supple, no elevated JVP or carotid  bruits, no thyromegaly. Lungs: Clear to auscultation, nonlabored breathing at rest. Cardiac: Regular rate and rhythm, no S3 or significant systolic murmur. Abdomen: Soft, nontender, no hepatomegaly, bowel sounds present, no guarding or rebound. Extremities: No pitting edema, distal pulses 2+.  ECG: I personally reviewed the tracing from 03/01/2017 which showed sinus rhythm with PAC.  Recent Labwork: 02/28/2017: BUN 16; Creatinine, Ser 0.68; Potassium 3.8; Sodium 137; TSH 3.521 03/01/2017: Hemoglobin 11.9; Platelets 132     Component Value Date/Time   CHOL 222 (H) 03/01/2017 0458   TRIG 109 03/01/2017 0458   HDL 47 03/01/2017 0458   CHOLHDL 4.7 03/01/2017 0458   VLDL 22 03/01/2017 0458   LDLCALC 153 (H) 03/01/2017 0458    Other Studies Reviewed Today:  Echocardiogram11/10/2016: Study  Conclusions  - Left ventricle: The cavity size was normal. Wall thickness was increased in a pattern of mild LVH. Systolic function was normal. The estimated ejection fraction was in the range of 60% to 65%. Wall motion was normal; there were no regional wall motion abnormalities. Features are consistent with a pseudonormal left ventricular filling pattern, with concomitant abnormal relaxation and increased filling pressure (grade 2 diastolic dysfunction). - Aortic valve: Mildly calcified annulus. Trileaflet. - Mitral valve: Mildly to moderately calcified annulus. There was trivial regurgitation. - Left atrium: The atrium was at the upper limits of normal in size. - Right atrium: Central venous pressure (est): 8 mm Hg. - Atrial septum: No defect or patent foramen ovale was identified. - Tricuspid valve: There was mild-moderate regurgitation. - Pulmonary arteries: PA peak pressure: 40 mm Hg (S). - Pericardium, extracardiac: There was no pericardial effusion.  Impressions:  - Mild LVH with LVEF 40-98%JXB grade 2 diastolic dysfunction. Mild to moderate mitral annular calcification with trivial mitral regurgitation. Upper normal left atrial chamber size. Mild to moderate tricuspid regurgitation with estimated PASP 40 mmHg.  Lexiscan Myoview 03/23/2017:  No diagnostic ST segment changes to indicate ischemia.  No significant myocardial perfusion defects to indicate scar or ischemia.  This is a low risk study.  Nuclear stress EF: 80%.  Assessment and Plan:  1.  Paroxysmal ectopic atrial tachycardia.  As noted above, she has taken herself off Cardizem CD and states that she feels better.  She does not have persistent tachycardia based on review of home blood pressure and heart rate log, I have asked her to keep an eye out for progressive palpitations or persistent tachycardia.  We would need to consider a different medication preparation in that case.  For now  she wants to stay off any new medications.  2.  Essential hypertension, tolerating lisinopril.  Keep follow-up with Dr. Quillian Quince.  Current medicines were reviewed with the patient today.  Disposition: Follow-up in 6 months.  Signed, Satira Sark, MD, [facility] 12/13/2017 1:28 PM    [facility], [place], [place] [address] Phone: [phone]; Fax: [phone]

## 2017-12-13 ENCOUNTER — Ambulatory Visit (INDEPENDENT_AMBULATORY_CARE_PROVIDER_SITE_OTHER): Payer: Medicare Other | Admitting: Cardiology

## 2017-12-13 ENCOUNTER — Encounter: Payer: Self-pay | Admitting: Cardiology

## 2017-12-13 VITALS — BP 133/70 | HR 78 | Ht 65.0 in | Wt 126.6 lb

## 2017-12-13 DIAGNOSIS — I471 Supraventricular tachycardia: Secondary | ICD-10-CM | POA: Diagnosis not present

## 2017-12-13 DIAGNOSIS — I10 Essential (primary) hypertension: Secondary | ICD-10-CM | POA: Diagnosis not present

## 2017-12-13 NOTE — Patient Instructions (Signed)

## 2017-12-27 IMAGING — NM NM MYOCAR MULTI W/SPECT W/WALL MOTION & EF
2 series · 12 of 12 positions shown · non-contrast
Comparison: none

[Series 1: rest · 6.51mm/px · 6 of 64 frames shown]
[frame 6/64]
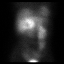
[frame 16/64]
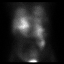
[frame 27/64]
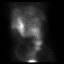
[frame 38/64]
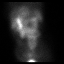
[frame 48/64]
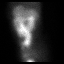
[frame 59/64]
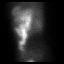

[Series 2: stress gated · 6.51mm/px · 6 of 64 frames shown]
[frame 6/64]
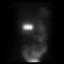
[frame 16/64]
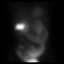
[frame 27/64]
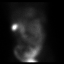
[frame 38/64]
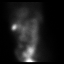
[frame 48/64]
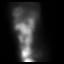
[frame 59/64]
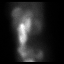

[12 of 12 positions shown; findings below may reference images not displayed]

Canned report from images found in remote index.

Refer to host system for actual result text.

## 2018-01-18 DIAGNOSIS — M7750 Other enthesopathy of unspecified foot: Secondary | ICD-10-CM | POA: Diagnosis not present

## 2018-01-18 DIAGNOSIS — Z6822 Body mass index (BMI) 22.0-22.9, adult: Secondary | ICD-10-CM | POA: Diagnosis not present

## 2018-01-24 DIAGNOSIS — M7750 Other enthesopathy of unspecified foot: Secondary | ICD-10-CM | POA: Diagnosis not present

## 2018-01-24 DIAGNOSIS — M25572 Pain in left ankle and joints of left foot: Secondary | ICD-10-CM | POA: Diagnosis not present

## 2018-01-24 DIAGNOSIS — Z6822 Body mass index (BMI) 22.0-22.9, adult: Secondary | ICD-10-CM | POA: Diagnosis not present

## 2018-02-06 DIAGNOSIS — M79672 Pain in left foot: Secondary | ICD-10-CM | POA: Diagnosis not present

## 2018-02-06 DIAGNOSIS — M76822 Posterior tibial tendinitis, left leg: Secondary | ICD-10-CM | POA: Diagnosis not present

## 2018-02-08 DIAGNOSIS — N812 Incomplete uterovaginal prolapse: Secondary | ICD-10-CM | POA: Diagnosis not present

## 2018-02-13 DIAGNOSIS — I10 Essential (primary) hypertension: Secondary | ICD-10-CM | POA: Diagnosis not present

## 2018-02-13 DIAGNOSIS — Z6822 Body mass index (BMI) 22.0-22.9, adult: Secondary | ICD-10-CM | POA: Diagnosis not present

## 2018-02-13 DIAGNOSIS — R05 Cough: Secondary | ICD-10-CM | POA: Diagnosis not present

## 2018-02-13 NOTE — H&P (Signed)
NAME: Kristen Mckenzie, Kristen Mckenzie MEDICAL RECORD [id number] ACCOUNT [id number] DATE OF BIRTH:12-02-1940 FACILITY: [facility] LOCATION:  PHYSICIAN:W. Evette Cristal, MD  HISTORY AND PHYSICAL  DATE OF ADMISSION:  02/21/2018  ADMITTING DIAGNOSES:  Uterine prolapse, cystocele with prolapse.  HISTORY OF PRESENT ILLNESS:  The patient is a 77 year old white widowed female with two living children who in the recent past and has had significant prolapse of her bladder and uterus.  For a period of time during her husband's terminal illness she  used a pessary to control this reasonably well.  She has now requested definitive surgical intervention.  She is admitted now for laparoscopic assisted vaginal hysterectomy, which will be performed by me and for vaginal repair and cystocele repair by Dr.  Gayla Doss.  Her current review of systems is negative.  She has no cardiopulmonary, GI or GU complaints.  PAST MEDICAL HISTORY:  Also benign.  She is known to have osteoporosis and has some arthritis.  She has mild hypertension for which she takes lisinopril 2.5 mg a day and she takes Lipitor 20 mg every other day for management of hypercholesterol.  For  pain she takes meloxicam 7.5 mg daily, which she has held for a week prior to this procedure.    ALLERGIES:  ALLERGIC TO PHENOBARBITAL AND TAMIFLU.  SOCIAL HISTORY:  She is not a cigarette smoker.  She does not use alcohol.  REVIEW OF SYSTEMS:  No cardiopulmonary, GI or GU complaints.  PHYSICAL EXAMINATION: HEENT:  Grossly within normal limits. VITAL SIGNS:  Her weight is 126 pounds.  Her blood pressure is 158/80. NECK:  Her thyroid gland is not palpably enlarged. CHEST:  Clear to auscultation throughout.  HEART:  Normal sinus rhythm without murmurs, rubs or gallops. ABDOMEN:  Benign.  No appreciable organomegaly or palpable masses. EXTREMITIES:  Without cyanosis, clubbing or edema. PELVIC:  Findings are remarkable only for findings for admitting  diagnoses.  ASSESSMENT:  Symptomatic cystocele with prolapse and uterine prolapse.  PLAN:  Laparoscopic assisted vaginal hysterectomy, bilateral salpingo-oophorectomy by me followed by repair of cystocele and vaginal vault suspension with Dr. Gayla Doss.  TN/NUANCE  D:02/13/2018 T:02/13/2018 JOB:[id number]

## 2018-02-14 NOTE — Progress Notes (Signed)
[place] cardiology Dr Rozann Lesches 12-13-17 epic   Stress Test 03-23-17 epic   EKG, CXR 02-28-17 epic   ECHO 03-01-17 epic

## 2018-02-14 NOTE — Patient Instructions (Addendum)
Kristen Mckenzie  02/14/2018       [name]  02/16/2018      Your procedure is scheduled on 02-21-18  Report to [place]  at  5:30A.M.    Call this number if you have problems the morning of surgery:[phone]    OUR ADDRESS IS [place], WE ARE LOCATED IN THE [facility]ICAL [facility] WITH [facility].   Remember:  Do not eat food or drink liquids after midnight.  Take these medicines the morning of surgery with A SIP OF WATER: NONE    Do not wear jewelry, make-up or nail polish.  Do not wear lotions, powders, or perfumes, or deoderant.  Do not shave 48 hours prior to surgery.  Men may shave face and neck.  Do not bring valuables to the hospital.  [facility] is not responsible for any belongings or valuables.  Contacts, dentures or bridgework may not be worn into surgery.  Leave your suitcase in the car.  After surgery it may be brought to your room.    Patients discharged the day of surgery will not be allowed to drive home.   Special instructions: N/A  Please read over the following fact sheets that you were given:               [facility] - Preparing for Surgery Before surgery, you can play an important role.  Because skin is not sterile, your skin needs to be as free of germs as possible.  You can reduce the number of germs on your skin by washing with CHG (chlorahexidine gluconate) soap before surgery.  CHG is an antiseptic cleaner which kills germs and bonds with the skin to continue killing germs even after washing. Please DO NOT use if you have an allergy to CHG or antibacterial soaps.  If your skin becomes reddened/irritated stop using the CHG and inform your nurse when you arrive at Short Stay. Do not shave (including legs and underarms) for at least 48 hours prior to the first CHG shower.  You may shave your face/neck. Please follow these instructions carefully:  1.  Shower with CHG Soap the night before  surgery and the  morning of Surgery.  2.  If you choose to wash your hair, wash your hair first as usual with your  normal  shampoo.  3.  After you shampoo, rinse your hair and body thoroughly to remove the  shampoo.                           4.  Use CHG as you would any other liquid soap.  You can apply chg directly  to the skin and wash                       Gently with a scrungie or clean washcloth.  5.  Apply the CHG Soap to your body ONLY FROM THE NECK DOWN.   Do not use on face/ open                           Wound or open sores. Avoid contact with eyes, ears mouth and genitals (private parts).                       Wash  face,  Genitals (private parts) with your normal soap.             6.  Wash thoroughly, paying special attention to the area where your surgery  will be performed.  7.  Thoroughly rinse your body with warm water from the neck down.  8.  DO NOT shower/wash with your normal soap after using and rinsing off  the CHG Soap.                9.  Pat yourself dry with a clean towel.            10.  Wear clean pajamas.            11.  Place clean sheets on your bed the night of your first shower and do not  sleep with pets. Day of Surgery : Do not apply any lotions/deodorants the morning of surgery.  Please wear clean clothes to the hospital/surgery center.  FAILURE TO FOLLOW THESE INSTRUCTIONS MAY RESULT IN THE CANCELLATION OF YOUR SURGERY PATIENT SIGNATURE_________________________________  NURSE SIGNATURE__________________________________  ________________________________________________________________________  WHAT IS A BLOOD TRANSFUSION? Blood Transfusion Information  A transfusion is the replacement of blood or some of its parts. Blood is made up of multiple cells which provide different functions.  Red blood cells carry oxygen and are used for blood loss replacement.  White blood cells fight against infection.  Platelets control bleeding.  Plasma helps clot  blood.  Other blood products are available for specialized needs, such as hemophilia or other clotting disorders. BEFORE THE TRANSFUSION  Who gives blood for transfusions?   Healthy volunteers who are fully evaluated to make sure their blood is safe. This is blood bank blood. Transfusion therapy is the safest it has ever been in the practice of medicine. Before blood is taken from a donor, a complete history is taken to make sure that person has no history of diseases nor engages in risky social behavior (examples are intravenous drug use or sexual activity with multiple partners). The donor's travel history is screened to minimize risk of transmitting infections, such as malaria. The donated blood is tested for signs of infectious diseases, such as HIV and hepatitis. The blood is then tested to be sure it is compatible with you in order to minimize the chance of a transfusion reaction. If you or a relative donates blood, this is often done in anticipation of surgery and is not appropriate for emergency situations. It takes many days to process the donated blood. RISKS AND COMPLICATIONS Although transfusion therapy is very safe and saves many lives, the main dangers of transfusion include:   Getting an infectious disease.  Developing a transfusion reaction. This is an allergic reaction to something in the blood you were given. Every precaution is taken to prevent this. The decision to have a blood transfusion has been considered carefully by your caregiver before blood is given. Blood is not given unless the benefits outweigh the risks. AFTER THE TRANSFUSION  Right after receiving a blood transfusion, you will usually feel much better and more energetic. This is especially true if your red blood cells have gotten low (anemic). The transfusion raises the level of the red blood cells which carry oxygen, and this usually causes an energy increase.  The nurse administering the transfusion will monitor  you carefully for complications. HOME CARE INSTRUCTIONS  No special instructions are needed after a transfusion. You may find your energy is better. Speak with your caregiver about any limitations  on activity for underlying diseases you may have. SEEK MEDICAL CARE IF:   Your condition is not improving after your transfusion.  You develop redness or irritation at the intravenous (IV) site. SEEK IMMEDIATE MEDICAL CARE IF:  Any of the following symptoms occur over the next 12 hours:  Shaking chills.  You have a temperature by mouth above 102 F (38.9 C), not controlled by medicine.  Chest, back, or muscle pain.  People around you feel you are not acting correctly or are confused.  Shortness of breath or difficulty breathing.  Dizziness and fainting.  You get a rash or develop hives.  You have a decrease in urine output.  Your urine turns a dark color or changes to pink, red, or brown. Any of the following symptoms occur over the next 10 days:  You have a temperature by mouth above 102 F (38.9 C), not controlled by medicine.  Shortness of breath.  Weakness after normal activity.  The white part of the eye turns yellow (jaundice).  You have a decrease in the amount of urine or are urinating less often.  Your urine turns a dark color or changes to pink, red, or brown. Document Released: 04/08/2000 Document Revised: 07/04/2011 Document Reviewed: 11/26/2007 [facility] Patient Information 2014 [place], [place].  _______________________________________________________________________

## 2018-02-16 ENCOUNTER — Encounter (HOSPITAL_COMMUNITY)
Admission: RE | Admit: 2018-02-16 | Discharge: 2018-02-16 | Disposition: A | Discharge status: Home / Self Care | Payer: Medicare Other | Source: Ambulatory Visit | Attending: Obstetrics & Gynecology | Admitting: Obstetrics & Gynecology

## 2018-02-16 ENCOUNTER — Other Ambulatory Visit: Payer: Self-pay

## 2018-02-16 ENCOUNTER — Encounter (HOSPITAL_COMMUNITY): Payer: Self-pay

## 2018-02-16 DIAGNOSIS — N3946 Mixed incontinence: Secondary | ICD-10-CM | POA: Diagnosis not present

## 2018-02-16 DIAGNOSIS — I10 Essential (primary) hypertension: Secondary | ICD-10-CM | POA: Insufficient documentation

## 2018-02-16 DIAGNOSIS — Z01812 Encounter for preprocedural laboratory examination: Secondary | ICD-10-CM | POA: Diagnosis not present

## 2018-02-16 DIAGNOSIS — R350 Frequency of micturition: Secondary | ICD-10-CM | POA: Insufficient documentation

## 2018-02-16 HISTORY — DX: Rectocele: N81.6

## 2018-02-16 HISTORY — DX: Anxiety disorder, unspecified: F41.9

## 2018-02-16 HISTORY — DX: Uterovaginal prolapse, unspecified: N81.4

## 2018-02-16 LAB — BASIC METABOLIC PANEL
Anion gap: 8 (ref 5–15)
BUN: 21 mg/dL (ref 8–23)
CHLORIDE: 104 mmol/L (ref 98–111)
CO2: 27 mmol/L (ref 22–32)
Calcium: 9.3 mg/dL (ref 8.9–10.3)
Creatinine, Ser: 0.81 mg/dL (ref 0.44–1.00)
GFR calc non Af Amer: >60 mL/min (ref >60)
Glucose, Bld: 97 mg/dL (ref 70–99)
POTASSIUM: 4.4 mmol/L (ref 3.5–5.1)
SODIUM: 139 mmol/L (ref 135–145)

## 2018-02-16 LAB — CBC
HEMATOCRIT: 40.2 % (ref 36.0–46.0)
HEMOGLOBIN: 13.3 g/dL (ref 12.0–15.0)
MCH: 31.6 pg (ref 26.0–34.0)
MCHC: 33.1 g/dL (ref 30.0–36.0)
MCV: 95.5 fL (ref 80.0–100.0)
NRBC: 0 % (ref 0.0–0.2)
Platelets: 212 10*3/uL (ref 150–400)
RBC: 4.21 MIL/uL (ref 3.87–5.11)
RDW: 13.1 % (ref 11.5–15.5)
WBC: 6.2 10*3/uL (ref 4.0–10.5)

## 2018-02-16 LAB — PROTIME-INR
INR: 1
Prothrombin Time: 13.1 seconds (ref 11.4–15.2)

## 2018-02-17 LAB — ABO/RH: ABO/RH(D): A POS

## 2018-02-20 ENCOUNTER — Encounter (HOSPITAL_BASED_OUTPATIENT_CLINIC_OR_DEPARTMENT_OTHER): Payer: Self-pay | Admitting: Anesthesiology

## 2018-02-20 NOTE — Anesthesia Preprocedure Evaluation (Addendum)
Anesthesia Evaluation  Patient identified by MRN, date of birth, ID band Patient awake    Reviewed: Allergy & Precautions, NPO status , Patient's Chart, lab work & pertinent test results  Airway Mallampati: I       Dental no notable dental hx. (+) Teeth Intact   Pulmonary neg pulmonary ROS,    Pulmonary exam normal breath sounds clear to auscultation       Cardiovascular hypertension, Pt. on medications Normal cardiovascular exam Rhythm:Regular Rate:Normal     Neuro/Psych PSYCHIATRIC DISORDERS Anxiety negative neurological ROS     GI/Hepatic negative GI ROS, Neg liver ROS,   Endo/Other  negative endocrine ROS  Renal/GU negative Renal ROS     Musculoskeletal   Abdominal Normal abdominal exam  (+)   Peds  Hematology negative hematology ROS (+)   Anesthesia Other Findings Kristen Mckenzie  ECHO COMPLETE WO IMAGING ENHANCING AGENT  Order# [id number]  Reading physician: Satira Sark, MD Ordering physician: Karmen Bongo, MD Study date: 03/01/17 Study Result   Result status: Final result                     *[address] [place], [place] [address]                            [phone]  ------------------------------------------------------------------- Transthoracic Echocardiography  Patient:    Kristen Mckenzie, Kristen Mckenzie MR #:       [id number] Study Date: 03/01/2017 Gender:     F Age:        20 Height:     165.1 cm Weight:     56.2 kg BSA:        1.6 m^2 Pt. Status: Room:       A334   ADMITTING    Zachery Dauer  ATTENDING    Wynetta Emery, Clanford L  SONOGRAPHER  Leavy Cella  PERFORMING   [facility], Deneise Lever Penn  cc:  ------------------------------------------------------------------- LV EF: 60% -    65%  ------------------------------------------------------------------- Indications:      Atrial fibrillation - 427.31.  ------------------------------------------------------------------- History:   PMH:  Tachycardia, Prolonged Grief  Risk factors: Hypertension.  ------------------------------------------------------------------- Study Conclusions  - Left ventricle: The cavity size was normal. Wall thickness was   increased in a pattern of mild LVH. Systolic function was normal.   The estimated ejection fraction was in the range of 60% to 65%.   Wall motion was normal; there were no regional wall motion   abnormalities. Features are consistent with a pseudonormal left   ventricular filling pattern, with concomitant abnormal relaxation   and increased filling pressure (grade 2 diastolic dysfunction). - Aortic valve: Mildly calcified annulus. Trileaflet. - Mitral valve: Mildly to moderately calcified annulus. There was   trivial regurgitation. - Left atrium: The atrium was at the upper limits of normal in   size. - Right atrium: Central venous pressure (est): 8 mm Hg. - Atrial septum: No defect or patent foramen ovale was identified. - Tricuspid valve: There was mild-moderate regurgitation. - Pulmonary arteries: PA peak pressure: 40 mm Hg (S). -  Pericardium, extracardiac: There was no pericardial effusion.  Impressions:  - Mild LVH with LVEF 78-29%FAO grade 2 diastolic dysfunction. Mild   to moderate mitral annular calcification with trivial mitral   regurgitation. Upper normal left atrial chamber size. Mild to   moderate tricuspid regurgitation with estimated PASP 40 mmHg.  ------------------------------------------------------------------- Study data:  No prior study was available for comparison.  Study status:  Routine.  Procedure:  Transthoracic echocardiography. Image quality was adequate.  Study completion:  There were no complications.          Transthoracic  echocardiography.  M-mode, complete 2D, spectral Doppler, and color Doppler.  Birthdate: Patient birthdate: [birth year].  Age:  Patient is 77 yr old.  Sex: Gender: female.    BMI: 20.6 kg/m^2.  Blood pressure:     120/60 Patient status:  Inpatient.  Study date:  Study date: 03/01/2017. Study time: 09:17 AM.  Location:  Bedside.  -------------------------------------------------------------------  ------------------------------------------------------------------- Left ventricle:  The cavity size was normal. Wall thickness was increased in a pattern of mild LVH. Systolic function was normal. The estimated ejection fraction was in the range of 60% to 65%. Wall motion was normal; there were no regional wall motion abnormalities. Features are consistent with a pseudonormal left ventricular filling pattern, with concomitant abnormal relaxation and increased filling pressure (grade 2 diastolic dysfunction).  ------------------------------------------------------------------- Aortic valve:   Mildly calcified annulus. Trileaflet.  Doppler: There was no significant regurgitation.  ------------------------------------------------------------------- Aorta:  Aortic root: The aortic root was normal in size.  ------------------------------------------------------------------- Mitral valve:   Mildly to moderately calcified annulus.  Doppler: There was trivial regurgitation.    Peak gradient (D): 6 mm Hg.  ------------------------------------------------------------------- Left atrium:  The atrium was at the upper limits of normal in size.   ------------------------------------------------------------------- Atrial septum:  No defect or patent foramen ovale was identified.   ------------------------------------------------------------------- Right ventricle:  The cavity size was normal. Systolic function  was normal.  ------------------------------------------------------------------- Pulmonic valve:    The valve appears to be grossly normal. Doppler:  There was no significant regurgitation.  ------------------------------------------------------------------- Tricuspid valve:   The valve appears to be grossly normal. Doppler:  There was mild-moderate regurgitation.  ------------------------------------------------------------------- Right atrium:  The atrium was normal in size.  ------------------------------------------------------------------- Pericardium:  There was no pericardial effusion.  ------------------------------------------------------------------- Systemic veins: Inferior vena cava: The vessel was normal in size. The respirophasic diameter changes were blunted (< 50%).  ------------------------------------------------------------------- Measurements   Left ventricle                         Value        Reference  LV ID, ED, PLAX chordal        (L)     27.5  mm     43 - 52  LV ID, ES, PLAX chordal        (L)     19.2  mm     23 - 38  LV fx shortening, PLAX chordal         30    %      >=29  LV PW thickness, ED                    11.8  mm     ----------  IVS/LV PW ratio, ED                    1.01         <=1.3  LV  e&', lateral                         6.42  cm/s   ----------  LV E/e&', lateral                       19.47        ----------  LV e&', medial                          5.22  cm/s   ----------  LV E/e&', medial                        23.95        ----------  LV e&', average                         5.82  cm/s   ----------  LV E/e&', average                       21.48        ----------    Ventricular septum                     Value        Reference  IVS thickness, ED                      11.9  mm     ----------    LVOT                                   Value        Reference  LVOT ID, S                             21    mm     ----------  LVOT  area                              3.46  cm^2   ----------    Aorta                                  Value        Reference  Aortic root ID, ED                     28    mm     ----------    Left atrium                            Value        Reference  LA ID, A-P, ES                         33    mm     ----------  LA ID/bsa, A-P                         2.06  cm/m^2 <=2.2  LA volume, S  51.9  ml     ----------  LA volume/bsa, S                       32.3  ml/m^2 ----------  LA volume, ES, 1-p A4C                 47.9  ml     ----------  LA volume/bsa, ES, 1-p A4C             29.9  ml/m^2 ----------  LA volume, ES, 1-p A2C                 58.7  ml     ----------  LA volume/bsa, ES, 1-p A2C             36.6  ml/m^2 ----------    Mitral valve                           Value        Reference  Mitral E-wave peak velocity            125   cm/s   ----------  Mitral A-wave peak velocity            107   cm/s   ----------  Mitral deceleration time       (H)     282   ms     150 - 230  Mitral peak gradient, D                6     mm Hg  ----------  Mitral E/A ratio, peak                 1.2          ----------    Pulmonary arteries                     Value        Reference  PA pressure, S, DP             (H)     40    mm Hg  <=30    Tricuspid valve                        Value        Reference  Tricuspid regurg peak velocity         281   cm/s   ----------  Tricuspid peak RV-RA gradient          32    mm Hg  ----------    Right atrium                           Value        Reference  RA ID, S-I, ES, A4C                    44.4  mm     34 - 49  RA area, ES, A4C               (L)     8.14  cm^2   8.3 - 19.5  RA volume, ES, A/L                     12.5  ml     ----------  RA  volume/bsa, ES, A/L                 7.8   ml/m^2 ----------    Systemic veins                         Value        Reference  Estimated CVP                          8     mm Hg  ----------     Right ventricle                        Value        Reference  TAPSE                                  18.7  mm     ----------  RV pressure, S, DP             (H)     40    mm Hg  <=30  RV s&', lateral, S                      10.6  cm/s   ----------  Legend: (L)  and  (H)  mark values outside specified reference range.  ------------------------------------------------------------------- Prepared and Electronically Authenticated by  Rozann Lesches, M.D. 2018-11-07T10:28:34 MERGE Images   Show images for ECHOCARDIOGRAM COMPLETE Patient Information   Patient Name Kristen Mckenzie, Kristen Mckenzie Sex Female DOB May 18, 1940 SSN [id number] Reason for Exam  Priority: Routine  Comments:  Surgical History   Surgical History    No past medical history on file.  Other Surgical History    Procedure Laterality Date Comment Source EYE SURGERY Bilateral 2011 cataract extraction  Provider  Patient Data   Height  65 in  BP  120/60 mmHg    Performing Technologist/Nurse   Performing Technologist/Nurse: Leavy Cella R          Implants    No active implants to display in this view. Order-Level Documents - 02/28/2017:   Scan on 03/01/2017 10:12 AM by Default, Provider, MDScan on 03/01/2017 10:12 AM by Default, Provider, MD    Encounter-Level Documents - 02/28/2017:   Document on 03/04/2017 10:10 PM by Jaci Carrel: ED PB Summary  Scan on 03/02/2017 10:27 AM by Default, Provider, MDScan on 03/02/2017 10:27 AM by Default, Provider, MD  Document on 03/01/2017 2:54 PM by Boneta Lucks, RN: IP After Visit Summary  Scan on 03/01/2017 10:13 AM by Default, Provider, MDScan on 03/01/2017 10:13 AM by Default, Provider, MD  Scan on 03/01/2017 3:56 PM by Default, Provider, MDScan on 03/01/2017 3:56 PM by Default, Provider, MD  Scan on 03/01/2017 9:07 AM by Default, Provider, MDScan on 03/01/2017 9:07 AM by Default, Provider, MD  Electronic signature on 02/28/2017 4:21 PM - Signed   Electronic signature on 02/28/2017 4:20 PM - Signed  Scan on 03/01/2017 5:56 AM by Default, Provider, MDScan on 03/01/2017 5:56 AM by Default, Provider, MD    Signed   Electronically signed by Satira Sark, MD on 03/01/17 at 1028 EST Printable Result Report    Result Report  External Result Report    External Result Report     Reproductive/Obstetrics  Anesthesia Physical Anesthesia Plan  ASA: II  Anesthesia Plan: General   Post-op Pain Management:    Induction: Intravenous  PONV Risk Score and Plan: 4 or greater and Ondansetron, Treatment may vary due to age or medical condition and Dexamethasone  Airway Management Planned: Oral ETT  Additional Equipment:   Intra-op Plan:   Post-operative Plan: Extubation in OR  Informed Consent: I have reviewed the patients History and Physical, chart, labs and discussed the procedure including the risks, benefits and alternatives for the proposed anesthesia with the patient or authorized representative who has indicated his/her understanding and acceptance.   Dental advisory given  Plan Discussed with: CRNA and Surgeon  Anesthesia Plan Comments:       Anesthesia Quick Evaluation

## 2018-02-20 NOTE — H&P (Signed)
I was consulted by the above provider to assess the patient's worsening prolapse over 12 months. She feels vaginal bulging. She does not reduce it and does not do splinting maneuvers. she has not had a hysterectomy   She has mild urge incontinence wearing 1 pad a day for cough in it. Most days it is dry. She only leaks if she cannot get there in time.   She voids approximately every 1 or 2 hr depending on fluid intake and has nocturia x1. Her flow varies between poor in reasonable and she does feel empty   on pelvic examination she had a grade 3 cystocele with central defect that reached the introitus. Her cervix actually descended to the introitus. her posterior vaginal length was still approximately 5 cm. With the prolapse reduced she had no stress incontinence with mild hypermobility the bladder neck. She had mild atrophy. With the prolapse reduced she had a small distal grade 2 rectocele that did not enlarged with Valsalva   The patient has symptomatic prolapse. She has mild urge incontinence that is dry most days. She is varying frequency and mild nocturia. if the patient had surgery she would likely best benefit from a transvaginal hysterectomy with vault suspension cystocele repair and graft. She would be consented for rectocele repair but based upon its size and her age she likely would not need 1. she had spoken to Dr Nori Riis regarding this.   The patient has failed a pessary. Picture was drawn. Mild risks of surgery and postop activity discussed. We had a circular conversation about surgery. Natural history discussed. Urodynamics order. I will send a note the above provider. based upon the visit and nurse interaction she does tend to be quite particular   not sexually active   Today  Prolapse and frequency stable  On urodynamics the patient was catheterized for 120 mL but had not voided for almost 90 min. Bladder capacity was 280 mL. Bladder was unstable reaching a pressure of 13 cm water.  She had urgency but did not leak. She generated pressures of 119 cm of water and no stress incontinence with a without the prolapse reduced. During voluntary voiding she voided 210 mL with maximal flow of 15 mL/second. Maximum voiding pressure was 22 cm water. She emptied efficiently. EMG activity increased during the voiding phase. Bladder neck descended 2-3 cm and she had a large cystocele fluoroscopically details of the urodynamics are sign and dictated   picture drawn. I reviewed a transvaginal vault suspension and cystocele repair in graft. We also discussed pessary and watchful waiting and rectocele repair if needed. Mesh issue described. Persistent or worsening incontinence discussed.   We had a lengthy discussion and she is not certain if she wants to take the risk of surgery. Then again she is concerned with aging she may not be able to have the surgery in the future. She will be following up with Dr. Milta Deiters for further discussion and I will send this note to him. He likely will call me if her going to proceed. Unfortunately her daughter was a loop could not come in today. She may make a return appointment with her daughter     ALLERGIES: phenobarbital - Hives Tamiflu - Swelling, Skin Rash    MEDICATIONS: Lisinopril 5 mg tablet  Ambien 5 mg tablet  Atorvastatin Calcium 40 mg tablet  Caltrate 600 + D  Centrum Silver  Cod Liver Oil  Diltiazem 24Hr Cd 120 mg capsule, ext release 24 hr  Magnesium 500  mg capsule  Potassium  Vita-C  Vitajoy Daily D 1,000 unit tablet, chewable  Vitamin A  Vitamin B Complex  Vitamin B12  Vitamin B-6  Vitamin E  Zinc     GU PSH: Complex cystometrogram, w/ void pressure and urethral pressure profile studies, any technique - 06/07/2017 Complex Uroflow - 06/07/2017 Emg surf Electrd - 06/07/2017 Inject For cystogram - 06/07/2017 Intrabd voidng Press - 06/07/2017    NON-GU PSH: None   GU PMH: Mixed incontinence - 05/17/2017 Nocturia - 05/17/2017 Urinary  Frequency - 05/17/2017    NON-GU PMH: Hypertension    FAMILY HISTORY: None   SOCIAL HISTORY: Marital Status: Widowed Preferred Language: English Current Smoking Status: Patient has never smoked.   Tobacco Use Assessment Completed: Used Tobacco in last 30 days? Has never drank.  Drinks 2 caffeinated drinks per day.    REVIEW OF SYSTEMS:    GU Review Female:   Patient denies frequent urination, hard to postpone urination, burning /pain with urination, get up at night to urinate, leakage of urine, stream starts and stops, trouble starting your stream, have to strain to urinate, and being pregnant.  Gastrointestinal (Upper):   Patient denies nausea, vomiting, and indigestion/ heartburn.  Gastrointestinal (Lower):   Patient denies diarrhea and constipation.  Constitutional:   Patient denies fever, night sweats, weight loss, and fatigue.  Skin:   Patient denies skin rash/ lesion and itching.  Eyes:   Patient denies blurred vision and double vision.  Ears/ Nose/ Throat:   Patient denies sore throat and sinus problems.  Hematologic/Lymphatic:   Patient denies swollen glands and easy bruising.  Cardiovascular:   Patient denies leg swelling and chest pains.  Respiratory:   Patient denies cough and shortness of breath.  Endocrine:   Patient denies excessive thirst.  Musculoskeletal:   Patient denies back pain and joint pain.  Neurological:   Patient denies headaches and dizziness.  Psychologic:   Patient denies depression and anxiety.   VITAL SIGNS: None   PAST DATA REVIEWED:  Source Of History:  Patient   PROCEDURES:          Urinalysis Dipstick Dipstick Cont'd  Color: Yellow Bilirubin: Neg  Appearance: Clear Ketones: Neg  Specific Gravity: 1.015 Blood: Neg  pH: 6.5 Protein: Trace  Glucose: Neg Urobilinogen: 0.2    Nitrites: Neg    Leukocyte Esterase: Neg    ASSESSMENT:      ICD-10 Details  1 GU:   Mixed incontinence - N39.46   2   Urinary Frequency - R35.0      PLAN:            Schedule Return Visit/Planned Activity: Return PRN - Office Visit   After a thorough review of the management options for the patient's condition the patient  elected to proceed with surgical therapy as noted above. We have discussed the potential benefits and risks of the procedure, side effects of the proposed treatment, the likelihood of the patient achieving the goals of the procedure, and any potential problems that might occur during the procedure or recuperation. Informed consent has been obtained.

## 2018-02-21 ENCOUNTER — Observation Stay (HOSPITAL_BASED_OUTPATIENT_CLINIC_OR_DEPARTMENT_OTHER)
Admission: RE | Admit: 2018-02-21 | Discharge: 2018-02-22 | Disposition: A | Discharge status: Home / Self Care | Payer: Medicare Other | Source: Ambulatory Visit | Attending: Obstetrics & Gynecology | Admitting: Obstetrics & Gynecology

## 2018-02-21 ENCOUNTER — Other Ambulatory Visit: Payer: Self-pay

## 2018-02-21 ENCOUNTER — Ambulatory Visit (HOSPITAL_BASED_OUTPATIENT_CLINIC_OR_DEPARTMENT_OTHER): Payer: Medicare Other | Admitting: Anesthesiology

## 2018-02-21 ENCOUNTER — Encounter (HOSPITAL_BASED_OUTPATIENT_CLINIC_OR_DEPARTMENT_OTHER): Payer: Self-pay | Admitting: Certified Registered"

## 2018-02-21 ENCOUNTER — Encounter (HOSPITAL_BASED_OUTPATIENT_CLINIC_OR_DEPARTMENT_OTHER)
Admission: RE | Disposition: A | Discharge status: Home / Self Care | Payer: Self-pay | Source: Ambulatory Visit | Attending: Obstetrics & Gynecology

## 2018-02-21 DIAGNOSIS — I10 Essential (primary) hypertension: Secondary | ICD-10-CM | POA: Insufficient documentation

## 2018-02-21 DIAGNOSIS — N8111 Cystocele, midline: Secondary | ICD-10-CM | POA: Diagnosis not present

## 2018-02-21 DIAGNOSIS — Z90710 Acquired absence of both cervix and uterus: Secondary | ICD-10-CM | POA: Diagnosis not present

## 2018-02-21 DIAGNOSIS — N814 Uterovaginal prolapse, unspecified: Secondary | ICD-10-CM | POA: Diagnosis not present

## 2018-02-21 DIAGNOSIS — Z79899 Other long term (current) drug therapy: Secondary | ICD-10-CM | POA: Diagnosis not present

## 2018-02-21 DIAGNOSIS — D259 Leiomyoma of uterus, unspecified: Secondary | ICD-10-CM | POA: Diagnosis not present

## 2018-02-21 HISTORY — PX: ANTERIOR AND POSTERIOR REPAIR: SHX5121

## 2018-02-21 HISTORY — PX: CYSTOSCOPY: SHX5120

## 2018-02-21 HISTORY — PX: LAPAROSCOPIC VAGINAL HYSTERECTOMY WITH SALPINGO OOPHORECTOMY: SHX6681

## 2018-02-21 LAB — TYPE AND SCREEN
ABO/RH(D): A POS
Antibody Screen: NEGATIVE

## 2018-02-21 SURGERY — HYSTERECTOMY, VAGINAL, LAPAROSCOPY-ASSISTED, WITH SALPINGO-OOPHORECTOMY
Anesthesia: General

## 2018-02-21 MED ORDER — FENTANYL CITRATE (PF) 250 MCG/5ML IJ SOLN
INTRAMUSCULAR | Status: AC
  Filled 2018-02-21: qty 5

## 2018-02-21 MED ORDER — HYDROMORPHONE 1 MG/ML IV SOLN
INTRAVENOUS | Status: DC
  Filled 2018-02-21: qty 25

## 2018-02-21 MED ORDER — HYDROMORPHONE HCL 1 MG/ML IJ SOLN
INTRAMUSCULAR | Status: AC
  Filled 2018-02-21: qty 1

## 2018-02-21 MED ORDER — CEFAZOLIN SODIUM 1 G IJ SOLR
INTRAMUSCULAR | Status: AC
  Filled 2018-02-21: qty 20

## 2018-02-21 MED ORDER — MENTHOL 3 MG MT LOZG
1.0000 | LOZENGE | OROMUCOSAL | Status: DC | PRN
  Filled 2018-02-21: qty 9

## 2018-02-21 MED ORDER — LIDOCAINE 2% (20 MG/ML) 5 ML SYRINGE
INTRAMUSCULAR | Status: AC
  Filled 2018-02-21: qty 5

## 2018-02-21 MED ORDER — BUPIVACAINE HCL (PF) 0.25 % IJ SOLN
INTRAMUSCULAR | Status: DC | PRN
  Administered 2018-02-21: 8 mL

## 2018-02-21 MED ORDER — ALUM & MAG HYDROXIDE-SIMETH 200-200-20 MG/5ML PO SUSP
30.0000 mL | ORAL | Status: DC | PRN
  Filled 2018-02-21: qty 30

## 2018-02-21 MED ORDER — KETOROLAC TROMETHAMINE 30 MG/ML IJ SOLN
INTRAMUSCULAR | Status: AC
  Filled 2018-02-21: qty 1

## 2018-02-21 MED ORDER — ONDANSETRON HCL 4 MG/2ML IJ SOLN
4.0000 mg | Freq: Four times a day (QID) | INTRAMUSCULAR | Status: DC | PRN
  Filled 2018-02-21: qty 2

## 2018-02-21 MED ORDER — NALOXONE HCL 0.4 MG/ML IJ SOLN
0.4000 mg | INTRAMUSCULAR | Status: DC | PRN
  Filled 2018-02-21: qty 1

## 2018-02-21 MED ORDER — ACETAMINOPHEN 325 MG PO TABS
650.0000 mg | ORAL_TABLET | Freq: Three times a day (TID) | ORAL | Status: DC | PRN
  Administered 2018-02-21: 650 mg via ORAL
  Filled 2018-02-21: qty 2

## 2018-02-21 MED ORDER — PHENYLEPHRINE HCL 10 MG/ML IJ SOLN
INTRAMUSCULAR | Status: AC
  Filled 2018-02-21: qty 1

## 2018-02-21 MED ORDER — DEXTROSE-NACL 5-0.45 % IV SOLN
INTRAVENOUS | Status: DC
  Administered 2018-02-21 (×2): via INTRAVENOUS
  Filled 2018-02-21 (×3): qty 1000

## 2018-02-21 MED ORDER — EPHEDRINE SULFATE-NACL 50-0.9 MG/10ML-% IV SOSY
PREFILLED_SYRINGE | INTRAVENOUS | Status: DC | PRN
  Administered 2018-02-21 (×3): 10 mg via INTRAVENOUS

## 2018-02-21 MED ORDER — ONDANSETRON HCL 4 MG/2ML IJ SOLN
INTRAMUSCULAR | Status: DC | PRN
  Administered 2018-02-21 (×2): 4 mg via INTRAVENOUS

## 2018-02-21 MED ORDER — PROMETHAZINE HCL 25 MG/ML IJ SOLN
6.2500 mg | INTRAMUSCULAR | Status: DC | PRN
  Filled 2018-02-21: qty 1

## 2018-02-21 MED ORDER — LIDOCAINE 2% (20 MG/ML) 5 ML SYRINGE
INTRAMUSCULAR | Status: DC | PRN
  Administered 2018-02-21: 100 mg via INTRAVENOUS

## 2018-02-21 MED ORDER — GENTAMICIN SULFATE 40 MG/ML IJ SOLN
5.0000 mg/kg | INTRAVENOUS | Status: DC
  Filled 2018-02-21: qty 7

## 2018-02-21 MED ORDER — SUGAMMADEX SODIUM 200 MG/2ML IV SOLN
INTRAVENOUS | Status: DC | PRN
  Administered 2018-02-21: 200 mg via INTRAVENOUS

## 2018-02-21 MED ORDER — GENTAMICIN SULFATE 40 MG/ML IJ SOLN
5.0000 mg/kg | INTRAVENOUS | Status: AC
  Administered 2018-02-21: 280 mg via INTRAVENOUS
  Filled 2018-02-21: qty 7

## 2018-02-21 MED ORDER — SODIUM CHLORIDE 0.9% FLUSH
9.0000 mL | INTRAVENOUS | Status: DC | PRN
  Filled 2018-02-21 (×2): qty 10

## 2018-02-21 MED ORDER — HYDROMORPHONE HCL 1 MG/ML IJ SOLN
0.2500 mg | INTRAMUSCULAR | Status: DC | PRN
  Administered 2018-02-21 (×2): 0.25 mg via INTRAVENOUS
  Administered 2018-02-22 (×3): 0.5 mg via INTRAVENOUS
  Filled 2018-02-21: qty 0.5

## 2018-02-21 MED ORDER — FENTANYL CITRATE (PF) 100 MCG/2ML IJ SOLN
INTRAMUSCULAR | Status: DC | PRN
  Administered 2018-02-21 (×5): 50 ug via INTRAVENOUS

## 2018-02-21 MED ORDER — SODIUM CHLORIDE 0.9 % IR SOLN
Status: DC | PRN
  Administered 2018-02-21: 1000 mL
  Administered 2018-02-21: 1000 mL via INTRAVESICAL

## 2018-02-21 MED ORDER — KETOROLAC TROMETHAMINE 0.5 % OP SOLN
1.0000 [drp] | Freq: Three times a day (TID) | OPHTHALMIC | Status: DC | PRN
  Administered 2018-02-21 – 2018-02-22 (×2): 1 [drp] via OPHTHALMIC
  Filled 2018-02-21 (×2): qty 3

## 2018-02-21 MED ORDER — CEFAZOLIN SODIUM-DEXTROSE 2-3 GM-%(50ML) IV SOLR
INTRAVENOUS | Status: DC | PRN
  Administered 2018-02-21: 2 g via INTRAVENOUS

## 2018-02-21 MED ORDER — PHENAZOPYRIDINE HCL 200 MG PO TABS
200.0000 mg | ORAL_TABLET | ORAL | Status: AC
  Administered 2018-02-21: 200 mg via ORAL
  Filled 2018-02-21: qty 1

## 2018-02-21 MED ORDER — SUGAMMADEX SODIUM 200 MG/2ML IV SOLN
INTRAVENOUS | Status: AC
  Filled 2018-02-21: qty 2

## 2018-02-21 MED ORDER — ROCURONIUM BROMIDE 10 MG/ML (PF) SYRINGE
PREFILLED_SYRINGE | INTRAVENOUS | Status: AC
  Filled 2018-02-21: qty 10

## 2018-02-21 MED ORDER — ONDANSETRON HCL 4 MG/2ML IJ SOLN
INTRAMUSCULAR | Status: AC
  Filled 2018-02-21: qty 2

## 2018-02-21 MED ORDER — LACTATED RINGERS IV SOLN
INTRAVENOUS | Status: DC
  Administered 2018-02-21 (×2): via INTRAVENOUS
  Filled 2018-02-21: qty 1000

## 2018-02-21 MED ORDER — SODIUM CHLORIDE 0.9 % IV SOLN
INTRAVENOUS | Status: DC | PRN
  Administered 2018-02-21: 20 ug/min via INTRAVENOUS

## 2018-02-21 MED ORDER — PROPOFOL 10 MG/ML IV BOLUS
INTRAVENOUS | Status: DC | PRN
  Administered 2018-02-21: 100 mg via INTRAVENOUS

## 2018-02-21 MED ORDER — DEXAMETHASONE SODIUM PHOSPHATE 10 MG/ML IJ SOLN
INTRAMUSCULAR | Status: AC
  Filled 2018-02-21: qty 1

## 2018-02-21 MED ORDER — PHENYLEPHRINE 40 MCG/ML (10ML) SYRINGE FOR IV PUSH (FOR BLOOD PRESSURE SUPPORT)
PREFILLED_SYRINGE | INTRAVENOUS | Status: AC
  Filled 2018-02-21: qty 10

## 2018-02-21 MED ORDER — KETOROLAC TROMETHAMINE 15 MG/ML IJ SOLN
15.0000 mg | Freq: Once | INTRAMUSCULAR | Status: AC
  Administered 2018-02-21: 15 mg via INTRAVENOUS
  Filled 2018-02-21: qty 1

## 2018-02-21 MED ORDER — ROCURONIUM BROMIDE 100 MG/10ML IV SOLN
INTRAVENOUS | Status: DC | PRN
  Administered 2018-02-21 (×3): 10 mg via INTRAVENOUS
  Administered 2018-02-21: 40 mg via INTRAVENOUS
  Administered 2018-02-21: 20 mg via INTRAVENOUS

## 2018-02-21 MED ORDER — LACTATED RINGERS IV SOLN
INTRAVENOUS | Status: DC | PRN
  Administered 2018-02-21 (×2): via INTRAVENOUS

## 2018-02-21 MED ORDER — PHENYLEPHRINE 40 MCG/ML (10ML) SYRINGE FOR IV PUSH (FOR BLOOD PRESSURE SUPPORT)
PREFILLED_SYRINGE | INTRAVENOUS | Status: DC | PRN
  Administered 2018-02-21 (×2): 80 ug via INTRAVENOUS
  Administered 2018-02-21: 40 ug via INTRAVENOUS
  Administered 2018-02-21 (×3): 80 ug via INTRAVENOUS

## 2018-02-21 MED ORDER — MEPERIDINE HCL 25 MG/ML IJ SOLN
6.2500 mg | INTRAMUSCULAR | Status: DC | PRN
  Filled 2018-02-21: qty 1

## 2018-02-21 MED ORDER — ACETAMINOPHEN 325 MG PO TABS
ORAL_TABLET | ORAL | Status: AC
  Filled 2018-02-21: qty 2

## 2018-02-21 MED ORDER — LIDOCAINE-EPINEPHRINE (PF) 1 %-1:200000 IJ SOLN
INTRAMUSCULAR | Status: DC | PRN
  Administered 2018-02-21: 19 mL

## 2018-02-21 MED ORDER — PHENAZOPYRIDINE HCL 100 MG PO TABS
ORAL_TABLET | ORAL | Status: AC
  Filled 2018-02-21: qty 2

## 2018-02-21 MED ORDER — BSS IO SOLN
15.0000 mL | Freq: Once | INTRAOCULAR | Status: DC
  Filled 2018-02-21: qty 15

## 2018-02-21 MED ORDER — ESTRADIOL 0.1 MG/GM VA CREA
TOPICAL_CREAM | VAGINAL | Status: DC | PRN
  Administered 2018-02-21: 1 via VAGINAL

## 2018-02-21 MED ORDER — EPHEDRINE 5 MG/ML INJ
INTRAVENOUS | Status: AC
  Filled 2018-02-21: qty 20

## 2018-02-21 MED ORDER — PROPOFOL 10 MG/ML IV BOLUS
INTRAVENOUS | Status: AC
  Filled 2018-02-21: qty 20

## 2018-02-21 MED ORDER — SIMETHICONE 80 MG PO CHEW
80.0000 mg | CHEWABLE_TABLET | Freq: Four times a day (QID) | ORAL | Status: DC | PRN
  Filled 2018-02-21: qty 1

## 2018-02-21 MED ORDER — DEXAMETHASONE SODIUM PHOSPHATE 10 MG/ML IJ SOLN
INTRAMUSCULAR | Status: DC | PRN
  Administered 2018-02-21: 10 mg via INTRAVENOUS

## 2018-02-21 SURGICAL SUPPLY — 129 items
ADH SKN CLS APL DERMABOND .7 (GAUZE/BANDAGES/DRESSINGS) ×2
ALLOGRAFT TUTOPLAST AXIS 6X12 (Tissue) IMPLANT
APL SKNCLS STERI-STRIP NONHPOA (GAUZE/BANDAGES/DRESSINGS)
BAG DRAIN URO-CYSTO SKYTR STRL (DRAIN) ×2 IMPLANT
BAG DRN UROCATH (DRAIN)
BAG RETRIEVAL 10 (BASKET)
BAG RETRIEVAL 10MM (BASKET)
BAG URINE DRAINAGE (UROLOGICAL SUPPLIES) IMPLANT
BALLN NEPHROSTOMY (BALLOONS)
BALLOON NEPHROSTOMY (BALLOONS) IMPLANT
BENZOIN TINCTURE PRP APPL 2/3 (GAUZE/BANDAGES/DRESSINGS) IMPLANT
BLADE CLIPPER SURG (BLADE) ×6 IMPLANT
BLADE HEX COATED 2.75 (ELECTRODE) ×4 IMPLANT
BLADE SURG 10 STRL SS (BLADE) ×2 IMPLANT
BLADE SURG 15 STRL LF DISP TIS (BLADE) ×4 IMPLANT
BLADE SURG 15 STRL SS (BLADE) ×12
CANISTER SUCT 3000ML PPV (MISCELLANEOUS) ×12 IMPLANT
CANISTER SUCTION 1200CC (MISCELLANEOUS) ×4 IMPLANT
CATH FOLEY 2WAY SLVR  5CC 16FR (CATHETERS)
CATH FOLEY 2WAY SLVR 5CC 16FR (CATHETERS) ×2 IMPLANT
CATH ROBINSON RED A/P 14FR (CATHETERS) ×4 IMPLANT
CLOSURE WOUND 1/4X4 (GAUZE/BANDAGES/DRESSINGS)
CLOTH BEACON ORANGE TIMEOUT ST (SAFETY) ×4 IMPLANT
COVER BACK TABLE 60X90IN (DRAPES) ×10 IMPLANT
COVER MAYO STAND STRL (DRAPES) ×18 IMPLANT
COVER WAND RF STERILE (DRAPES) ×8 IMPLANT
DERMABOND ADVANCED (GAUZE/BANDAGES/DRESSINGS) ×2
DERMABOND ADVANCED .7 DNX12 (GAUZE/BANDAGES/DRESSINGS) ×2 IMPLANT
DRAIN PENROSE 18X1/4 LTX STRL (WOUND CARE) ×2 IMPLANT
DRAPE UNDERBUTTOCKS STRL (DRAPE) ×4 IMPLANT
DRAPE UTILITY XL STRL (DRAPES) IMPLANT
DRSG COVADERM PLUS 2X2 (GAUZE/BANDAGES/DRESSINGS) IMPLANT
DRSG OPSITE POSTOP 3X4 (GAUZE/BANDAGES/DRESSINGS) ×4 IMPLANT
DRSG TEGADERM 2-3/8X2-3/4 SM (GAUZE/BANDAGES/DRESSINGS) ×2 IMPLANT
DRSG TELFA 3X8 NADH (GAUZE/BANDAGES/DRESSINGS) IMPLANT
ELECT LIGASURE LONG (ELECTRODE) ×2 IMPLANT
ELECT REM PT RETURN 9FT ADLT (ELECTROSURGICAL) ×4
ELECTRODE REM PT RTRN 9FT ADLT (ELECTROSURGICAL) ×4 IMPLANT
GAUZE 4X4 16PLY RFD (DISPOSABLE) ×8 IMPLANT
GLOVE BIO SURGEON STRL SZ7.5 (GLOVE) ×24 IMPLANT
GLOVE ECLIPSE 6.5 STRL STRAW (GLOVE) ×4 IMPLANT
GOWN STRL REUS W/ TWL XL LVL3 (GOWN DISPOSABLE) ×2 IMPLANT
GOWN STRL REUS W/TWL XL LVL3 (GOWN DISPOSABLE) ×16 IMPLANT
GOWN W/COTTON TOWEL STD LRG (GOWNS) ×8 IMPLANT
GOWN XL W/COTTON TOWEL STD (GOWNS) ×4 IMPLANT
GUIDEWIRE STR DUAL SENSOR (WIRE) ×4 IMPLANT
HOLDER FOLEY CATH W/STRAP (MISCELLANEOUS) ×6 IMPLANT
HOOK RETRACTION 12 ELAST STAY (MISCELLANEOUS) ×2 IMPLANT
KIT TURNOVER CYSTO (KITS) ×8 IMPLANT
MANIFOLD NEPTUNE II (INSTRUMENTS) IMPLANT
NDL HYPO 25X1 1.5 SAFETY (NEEDLE) ×2 IMPLANT
NDL INSUFFLATION 14GA 150MM (NEEDLE) IMPLANT
NDL SAFETY ECLIPSE 18X1.5 (NEEDLE) IMPLANT
NDL SUT 6 .5 CRC .975X.05 MAYO (NEEDLE) IMPLANT
NEEDLE HYPO 18GX1.5 SHARP (NEEDLE)
NEEDLE HYPO 22GX1.5 SAFETY (NEEDLE) ×2 IMPLANT
NEEDLE HYPO 25X1 1.5 SAFETY (NEEDLE) ×4 IMPLANT
NEEDLE INSUFFLATION 120MM (ENDOMECHANICALS) ×4 IMPLANT
NEEDLE INSUFFLATION 14GA 150MM (NEEDLE) IMPLANT
NEEDLE MAYO TAPER (NEEDLE) ×4
NS IRRIG 500ML POUR BTL (IV SOLUTION) ×4 IMPLANT
PACK BASIN DAY SURGERY FS (CUSTOM PROCEDURE TRAY) ×4 IMPLANT
PACK CYSTO (CUSTOM PROCEDURE TRAY) ×4 IMPLANT
PACK CYSTOSCOPY (CUSTOM PROCEDURE TRAY) ×2 IMPLANT
PACK LAVH (CUSTOM PROCEDURE TRAY) ×4 IMPLANT
PACK ROBOTIC GOWN (GOWN DISPOSABLE) ×4 IMPLANT
PACK TRENDGUARD 450 HYBRID PRO (MISCELLANEOUS) IMPLANT
PACKING VAGINAL (PACKING) ×6 IMPLANT
PAD DRESSING TELFA 3X8 NADH (GAUZE/BANDAGES/DRESSINGS) IMPLANT
PAD OB MATERNITY 4.3X12.25 (PERSONAL CARE ITEMS) ×4 IMPLANT
PAD PREP 24X48 CUFFED NSTRL (MISCELLANEOUS) ×4 IMPLANT
PENCIL BUTTON HOLSTER BLD 10FT (ELECTRODE) ×4 IMPLANT
PLUG CATH AND CAP STER (CATHETERS) ×4 IMPLANT
RETRACTOR STERILE 25.8CMX11.3 (INSTRUMENTS) IMPLANT
SCISSORS LAP 5X35 DISP (ENDOMECHANICALS) IMPLANT
SEALER TISSUE G2 CVD JAW 35 (ENDOMECHANICALS) IMPLANT
SEALER TISSUE G2 CVD JAW 45CM (ENDOMECHANICALS) ×2 IMPLANT
SET IRRIG TUBING LAPAROSCOPIC (IRRIGATION / IRRIGATOR) ×4 IMPLANT
SHEET LAVH (DRAPES) ×2 IMPLANT
SOLUTION ELECTROLUBE (MISCELLANEOUS) IMPLANT
SPONGE GAUZE 2X2 8PLY STER LF (GAUZE/BANDAGES/DRESSINGS) ×1
SPONGE GAUZE 2X2 8PLY STRL LF (GAUZE/BANDAGES/DRESSINGS) ×1 IMPLANT
STAPLER VISISTAT 35W (STAPLE) IMPLANT
STRIP CLOSURE SKIN 1/4X4 (GAUZE/BANDAGES/DRESSINGS) IMPLANT
SURGILUBE 2OZ TUBE FLIPTOP (MISCELLANEOUS) ×4 IMPLANT
SUT CAPIO ETHIBPND (SUTURE) ×4 IMPLANT
SUT CHROMIC 2 0 CT 1 (SUTURE) IMPLANT
SUT MNCRL 0 MO-4 VIOLET 18 CR (SUTURE) ×2 IMPLANT
SUT MNCRL 0 VIOLET 6X18 (SUTURE) ×2 IMPLANT
SUT MONOCRYL 0 6X18 (SUTURE) ×2
SUT MONOCRYL 0 MO 4 18  CR/8 (SUTURE) ×2
SUT PLAIN 2 0 XLH (SUTURE) IMPLANT
SUT SILK 2 0 30  PSL (SUTURE) ×2
SUT SILK 2 0 30 PSL (SUTURE) ×2 IMPLANT
SUT VIC AB 0 CT1 27 (SUTURE) ×8
SUT VIC AB 0 CT1 27XBRD ANBCTR (SUTURE) IMPLANT
SUT VIC AB 0 CT1 36 (SUTURE) IMPLANT
SUT VIC AB 2-0 CT1 27 (SUTURE) ×8
SUT VIC AB 2-0 CT1 TAPERPNT 27 (SUTURE) ×4 IMPLANT
SUT VIC AB 2-0 SH 27 (SUTURE) ×24
SUT VIC AB 2-0 SH 27XBRD (SUTURE) ×12 IMPLANT
SUT VIC AB 3-0 PS2 18 (SUTURE) ×4
SUT VIC AB 3-0 PS2 18XBRD (SUTURE) ×2 IMPLANT
SUT VICRYL 0 UR6 27IN ABS (SUTURE) IMPLANT
SUT VICRYL 4-0 PS2 18IN ABS (SUTURE) ×4 IMPLANT
SYR 10ML LL (SYRINGE) ×4 IMPLANT
SYR 20CC LL (SYRINGE) IMPLANT
SYR 3ML 23GX1 SAFETY (SYRINGE) IMPLANT
SYR BULB IRRIGATION 50ML (SYRINGE) ×8 IMPLANT
SYR CONTROL 10ML LL (SYRINGE) ×4 IMPLANT
SYRINGE IRR TOOMEY STRL 70CC (SYRINGE) IMPLANT
SYS BAG RETRIEVAL 10MM (BASKET)
SYSTEM BAG RETRIEVAL 10MM (BASKET) IMPLANT
TOWEL OR 17X24 6PK STRL BLUE (TOWEL DISPOSABLE) ×12 IMPLANT
TRAY DSU PREP LF (CUSTOM PROCEDURE TRAY) ×4 IMPLANT
TRAY FOLEY BAG SILVER LF 16FR (CATHETERS) ×4 IMPLANT
TRENDGUARD 450 HYBRID PRO PACK (MISCELLANEOUS)
TROCAR 12M 150ML BLUNT (TROCAR) IMPLANT
TROCAR XCEL BLUNT TIP 100MML (ENDOMECHANICALS) IMPLANT
TROCAR XCEL DIL TIP R 11M (ENDOMECHANICALS) ×4 IMPLANT
TROCAR Z-THREAD BLADED 5X100MM (TROCAR) ×4 IMPLANT
TUBE CONNECTING 12'X1/4 (SUCTIONS) ×2
TUBE CONNECTING 12X1/4 (SUCTIONS) ×6 IMPLANT
TUBING TUR Y LATEX (MISCELLANEOUS) ×4 IMPLANT
TUTOPLAST AXIS 6X12 (Tissue) IMPLANT
WARMER LAPAROSCOPE (MISCELLANEOUS) ×4 IMPLANT
WATER STERILE IRR 3000ML UROMA (IV SOLUTION) ×4 IMPLANT
WATER STERILE IRR 500ML POUR (IV SOLUTION) ×4 IMPLANT
YANKAUER SUCT BULB TIP NO VENT (SUCTIONS) ×4 IMPLANT

## 2018-02-21 NOTE — Anesthesia Procedure Notes (Signed)
Procedure Name: Intubation Date/Time: 02/21/2018 7:54 AM Performed by: Gwyndolyn Saxon, CRNA Pre-anesthesia Checklist: Patient identified, Emergency Drugs available, Suction available and Patient being monitored Patient Re-evaluated:Patient Re-evaluated prior to induction Oxygen Delivery Method: Circle system utilized Preoxygenation: Pre-oxygenation with 100% oxygen Induction Type: IV induction Ventilation: Mask ventilation without difficulty Laryngoscope Size: Miller and 2 Grade View: Grade I Tube type: Oral Tube size: 7.0 mm Number of attempts: 1 Placement Confirmation: ETT inserted through vocal cords under direct vision,  positive ETCO2 and breath sounds checked- equal and bilateral Secured at: 20 cm Tube secured with: Tape Dental Injury: Teeth and Oropharynx as per pre-operative assessment

## 2018-02-21 NOTE — Transfer of Care (Signed)
Immediate Anesthesia Transfer of Care Note  Patient: Kristen Mckenzie  Procedure(s) Performed: LAPAROSCOPIC ASSISTED VAGINAL HYSTERECTOMY WITH SALPINGO OOPHORECTOMY (Bilateral ) ANTERIOR (CYSTOCELE) REPAIR (N/A ) CYSTOSCOPY (N/A )  Patient Location: PACU  Anesthesia Type:General  Level of Consciousness: drowsy  Airway & Oxygen Therapy: Patient Spontanous Breathing and Patient connected to face mask oxygen  Post-op Assessment: Report given to RN and Post -op Vital signs reviewed and stable  Post vital signs: Reviewed and stable  Last Vitals:  Vitals Value Taken Time  BP 124/56 02/21/2018 12:02 PM  Temp    Pulse 79 02/21/2018 12:09 PM  Resp 20 02/21/2018 12:09 PM  SpO2 100 % 02/21/2018 12:09 PM  Vitals shown include unvalidated device data.  Last Pain:  Vitals:   02/21/18 0655  TempSrc:   PainSc: 0-No pain      Patients Stated Pain Goal: 9 (83/43/73 [id number])  Complications: No apparent anesthesia complications

## 2018-02-21 NOTE — Progress Notes (Signed)
NS irrigant given to pt and Acular 1 gtt applied to left eye as ordered.

## 2018-02-21 NOTE — Addendum Note (Signed)
Addendum  created 02/21/18 1738 by Murvin Natal, MD   Sign clinical note

## 2018-02-21 NOTE — Progress Notes (Signed)
Pt c/o severe left eye pain.  PERRLA.  Ice pack applied earlier and HOB elevated.  Dr. Nori Riis notified. Order given to call anesthesia.  Anesthesia notified and orders to be entered

## 2018-02-21 NOTE — Anesthesia Postprocedure Evaluation (Signed)
Anesthesia Post Note  Patient: REMINGTYN DEPAOLA  Procedure(s) Performed: LAPAROSCOPIC ASSISTED VAGINAL HYSTERECTOMY WITH SALPINGO OOPHORECTOMY (Bilateral ) ANTERIOR (CYSTOCELE) REPAIR (N/A ) CYSTOSCOPY (N/A )     Patient location during evaluation: PACU Anesthesia Type: General Level of consciousness: awake Pain management: pain level controlled Vital Signs Assessment: post-procedure vital signs reviewed and stable Respiratory status: spontaneous breathing Cardiovascular status: stable Postop Assessment: no apparent nausea or vomiting Anesthetic complications: no    Last Vitals:  Vitals:   02/21/18 1315 02/21/18 1330  BP: 124/63 (!) 131/59  Pulse: 65 67  Resp: 17 19  Temp:    SpO2: 100% 100%    Last Pain:  Vitals:   02/21/18 1315  TempSrc:   PainSc: 0-No pain   Pain Goal: Patients Stated Pain Goal: 9 (02/21/18 0655)               Huston Foley

## 2018-02-21 NOTE — Progress Notes (Signed)
Called and notified of patient complaining of left eye pain. Patient seen and examined in [facility] 2. Left eye injection noted. Potential foreign body, corneal irritation, or corneal abrasion discussed with patient. Eye drops ordered.

## 2018-02-21 NOTE — Op Note (Signed)
Preoperative diagnosis: Cystocele and moderate vault prolapse Postoperative diagnosis: Cystocele and moderate vault prolapse and mild rectocele Surgery: Cystocele repair and cystoscopy Surgeon: Dr. Nicki Reaper Letizia Hook Assistant: Dr.Kate Carlean Purl  The patient has the above diagnosis and consent of the above procedure.  She underwent a hysterectomy by the gynecology team.  I came in prior to the case and set her legs.  Extra care was taken to minimize the risk of compartment syndrome and neuropathy and deep vein thrombosis.  She had a mild grade 2 rectocele or less and a grade 3 cystocele with central defect that just reached the introitus.  The cuff was left open by the gynecology team when I entered the room.  The ureteral sacral ligaments were tagged.  She had quite a bit of oozing from the posterior cuff and near the left ureteral sacral ligament.  I ran the cuff posteriorly with running 2-0 Vicryl and CT1 needle.  With fulguration and a double looked by gynecology bleeding was well controlled.  With my Allis technique I made an anterior incision.  I had instilled 20 cc of the lidocaine epinephrine mixture.  Her anatomy was less common.  She had a very narrow pubic arch and very long vaginal wall flaps to high fornices bilaterally along the entire length of the vagina.  I did my usual dissection and mobilized the overlying epithelium from the underlying pubocervical fascia to the white line bilaterally.  I was pleased with my mobilization at the apex.  I did an anterior repair anatomically.  There was not shortening.  I used 2-0 Vicryl running suture 2 layer.  I was very pleased with the reduction.  It made a tremendous difference of the cystocele.  I cystoscoped the patient.  I had cystoscoped her after the hysterectomy and the same findings were noted.  She had a good repair.  Ureters were not deflected and there was excellent reflux bilaterally  Along an appropriate plane I dissected down to the  ischial spine bilaterally.  I was very pleased with the plane and I could feel the small spine bilaterally but even after at least 20 minutes I could not mobilized tissue medially.  I could not explain the findings but I certainly could not make any space to place a 0 Ethibond safely.  I did not want to injure the patient and I did not do the sacrospinous fixation.  I trimmed an appropriate amount of anterior vaginal wall epithelium and closed the epithelium with running to 2-0 Vicryl SH needle.  I closed the cuff from right the left and left to right with 0 Vicryl and CT1 needle.  I did a gentle mcCaull culdoplasty.  The cuff closure went well  I took down my retractor and I was pleasantly surprised with her anatomy.  She had tremendous length of the vagina and excellent support anteriorly.  She had minimal rectocele.  With rectal examination the posterior apex was well supported.  she definitely did not need a rectocele repair or support posteriorly of the posterior apex.  Blood loss was less than 150 mL.  Vaginal pack was placed.  Leg position was good.  Urine output was good.  Think it was due to the hysterectomy and her initial anatomy described above is why the anterior repair by itself looks so good because all of her anatomy was so deep  Even without the support of the sacrospinous I was very pleased with the anatomic repair.  Hopefully she will get a good long-term result  and reach her treatment goal.

## 2018-02-21 NOTE — Addendum Note (Signed)
Addendum  created 02/21/18 1658 by Murvin Natal, MD   Order list changed, Order sets accessed

## 2018-02-21 NOTE — Interval H&P Note (Signed)
History and Physical Interval Note:  02/21/2018 7:29 AM  Kristen Mckenzie  has presented today for surgery, with the diagnosis of uterine prolapse, cystocele  The various methods of treatment have been discussed with the patient and family. After consideration of risks, benefits and other options for treatment, the patient has consented to  Procedure(s) with comments: [place] (Bilateral) - Dr. Matilde Sprang to follow ANTERIOR (CYSTOCELE) AND POSTERIOR REPAIR (RECTOCELE) (N/A) VAGINAL VAULT SUSPENSION with graft (N/A) CYSTOSCOPY (N/A) as a surgical intervention .  [name] history has been reviewed, patient examined, no change in status, stable for surgery.  I have reviewed the patient's chart and labs.  Questions were answered to the patient's satisfaction.     Jontay Maston A

## 2018-02-22 DIAGNOSIS — I10 Essential (primary) hypertension: Secondary | ICD-10-CM | POA: Diagnosis not present

## 2018-02-22 DIAGNOSIS — N814 Uterovaginal prolapse, unspecified: Secondary | ICD-10-CM | POA: Diagnosis not present

## 2018-02-22 DIAGNOSIS — Z79899 Other long term (current) drug therapy: Secondary | ICD-10-CM | POA: Diagnosis not present

## 2018-02-22 DIAGNOSIS — Z90710 Acquired absence of both cervix and uterus: Secondary | ICD-10-CM | POA: Diagnosis not present

## 2018-02-22 LAB — CBC
HCT: 31.1 % — ABNORMAL LOW (ref 36.0–46.0)
HEMOGLOBIN: 10.1 g/dL — AB (ref 12.0–15.0)
MCH: 31.6 pg (ref 26.0–34.0)
MCHC: 32.5 g/dL (ref 30.0–36.0)
MCV: 97.2 fL (ref 80.0–100.0)
NRBC: 0 % (ref 0.0–0.2)
Platelets: 166 10*3/uL (ref 150–400)
RBC: 3.2 MIL/uL — AB (ref 3.87–5.11)
RDW: 13.3 % (ref 11.5–15.5)
WBC: 10.8 10*3/uL — AB (ref 4.0–10.5)

## 2018-02-22 MED ORDER — HYDROMORPHONE HCL 1 MG/ML IJ SOLN
INTRAMUSCULAR | Status: AC
  Filled 2018-02-22: qty 1

## 2018-02-22 NOTE — Discharge Summary (Signed)
NAME: Kristen Mckenzie, Kristen Mckenzie MEDICAL RECORD [id number] ACCOUNT [id number] DATE OF BIRTH:January 11, 1941 FACILITY: [facility] LOCATION: [facility] PHYSICIAN:Bexton Haak. Evette Cristal, MD  DISCHARGE SUMMARY  DATE OF DISCHARGE:  02/22/2018  DISCHARGE DIAGNOSES:  Postoperatively, patient from laparoscopic-assisted vaginal hysterectomy and bladder repair per Dr. Matilde Sprang.  Details of the present illness recorded in the admission note is as in the past history, etc.  The patient was admitted on 02/21/2018 for the procedure noted above.  This was performed without significant difficulty or intraoperative complications.  The  patient remained in the hospital overnight.  Her vital signs were stable.  She was alert and oriented and was considered to be in satisfactory condition.  She had voided with her catheter out and was allowed to go home without a catheter.  She was given  Dilaudid 2 mg by me to be taken 1 every 2-4 hours as needed for pain.  She is to return to the office in approximately 2 weeks.  She has a followup appointment with Dr. Matilde Sprang, and I am not certain of that time.  LN/NUANCE D:02/22/2018 T:02/22/2018 JOB:[id number]

## 2018-02-22 NOTE — Progress Notes (Signed)
D: pt went bathroom, void in the toilet.unable to measure due to no hats prepared. But pt stated void a lot. Reminded pt to use IS , and how important to take deep breath. Pt. Walked in the hall way with daughter. Remains c/o  the cross chest pain and back pain.  Pain med given @ [phone]  And now a little bit better, not much improve. Will monitor.

## 2018-02-22 NOTE — Op Note (Signed)
NAME: [name], Kristen Mckenzie MEDICAL RECORD [id number] ACCOUNT [id number] DATE OF BIRTH:05-03-1940 FACILITY: [facility] LOCATION: [facility] PHYSICIAN:W. Evette Cristal, MD  OPERATIVE REPORT  DATE OF PROCEDURE:  02/21/2018  PROCEDURE:  Laparoscopically-assisted vaginal hysterectomy by me and followed by vaginal repair per Dr. Bjorn Loser, who will dictate that.  ANESTHESIA:  General endotracheal.  INTRAOPERATIVE COMPLICATIONS:  None.  INTRAOPERATIVE BLOOD LOSS:  With my portion of the procedure, less than 100 mL.  PROCEDURE IN DETAIL:  Details of the present illness are recorded in the admission.  The patient was admitted on the morning of surgery.  She was given Ancef preoperatively.  She was brought to the operating room there and placed under adequate general  endotracheal anesthesia, placed in the dorsal lithotomy position using the [place] stirrup system.  Betadine prep of mons perineum was carried out.  Hibiclens was applied to the upper abdomen and allowed to dry.  Sterile drapes were applied.  A Foley  catheter was placed to keep the bladder empty during the procedure and for Dr. McDiarmid's needs.  Two small incisions were made in the abdomen, 1 at the umbilicus, 1 just above the symphysis.  Through the operative incision, a Veress needle was  introduced, and pneumoperitoneum was allowed to cannulate with carbon dioxide gas.  The second incision was made just above the symphysis.  Through the operative incision, a 10/11 trocar was introduced.  Pneumoperitoneum was allowed to accumulate to  approximately 2-3 L before this was done.  Inspection of the laparoscope revealed adequate placement with no evidence of injury on entry.  Under direct visualization, a 5 mm trocar was placed through the lower incision.  Scanning inspection of the upper  abdomen revealed no apparent abnormalities.  Pelvic contents were visualized.  Anatomy was somewhat distorted by the fact that the uterus and cervix were  prolapsed through the vagina and some of the perineum.  Using grasping forceps, each adnexa was  elevated.  This included the tube and a streak ovary.  The LigaSure style device was then used to seal and divide the mesosalpinx in the upper broad ligament on each side.  At this point, attention was turned vaginally.  Very careful dissection was  carried out to make certain that we did not enter the bladder.  The cervix was circumscribed with a scalpel, and with sharp and blunt dissection, eventually, the posterior peritoneum was entered, as was the anterior peritoneum.  This allowed Heaney  clamps to clamp the vessels which were suture ligated.  The uterosacrals were then plicated with 0 Monocryl suture in a Heaney fashion.  These were left tagged for Dr. Mikle Bosworth use during the procedure.  At this point, the procedure was turned over  to Dr. Matilde Sprang.  LN/NUANCE  D:02/22/2018 T:02/22/2018 JOB:[id number]

## 2018-02-22 NOTE — Progress Notes (Signed)
Looks good Hurts all over including back Minimal vaginal pain and no leg pain Op Detailed to [pt and family Labs OK DC foley and pack DC home when ok with gyne

## 2018-02-22 NOTE — Progress Notes (Signed)
D; Pt void 2nd time, 250cc, post residual bladder scan 0cc Notified dr. Milus Height, ok to go ,will call tomorrow.

## 2018-02-22 NOTE — Progress Notes (Signed)
D ; veryfied dicharge  With Dr. Nori Riis.  Dc Iv's, pt stated f/u appointment set up already.

## 2018-02-22 NOTE — Progress Notes (Addendum)
Pt c/o chest pain that she is experiencing when she is at rest and during deep breaths. When asked to describe the pain she describes it as soreness that has progressed to her shoulders, back, and somewhat" all over her body". Patient rating pain 10/10. On call provider has been paged. Patient BP is 118/53 HR 96. Bilateral lung sounds are clear. Pulse is regular in strong. Abdomen is soft and active. Patient requests IV Dilaudid for pain relief, 0.5 of dilaudid given. Patient then requested to ambulate to help with soreness. Patient completed a half of a lap around unit, which she tolerated fairly but complains of dizziness. Patient returned to bed where she is now resting comfortably. Will continue to monitor patient and await return call from on call physician. Lenna Sciara, RN   Update: 12 Lead EKG ordered by Dr. Julien Girt for patient's complaint of chest pain. EKG interpretation is Normal Sinus rhythm. No additional orders at this time. Patient is still resting comfortably. Will continue to monitor patient. Lenna Sciara, RN  5:12 AM

## 2018-02-23 ENCOUNTER — Encounter (HOSPITAL_BASED_OUTPATIENT_CLINIC_OR_DEPARTMENT_OTHER): Payer: Self-pay | Admitting: Obstetrics & Gynecology

## 2018-03-08 ENCOUNTER — Telehealth: Payer: Self-pay | Admitting: Cardiology

## 2018-03-08 DIAGNOSIS — N3946 Mixed incontinence: Secondary | ICD-10-CM | POA: Diagnosis not present

## 2018-03-08 DIAGNOSIS — R350 Frequency of micturition: Secondary | ICD-10-CM | POA: Diagnosis not present

## 2018-03-08 NOTE — Telephone Encounter (Signed)
Patient called in regards to her HR being elevated.

## 2018-03-08 NOTE — Telephone Encounter (Signed)
Patient was seen at her Urology appt today and says her BP was 159/70 & HR 101 immediately after sitting down. BP was 120/75 & HR 101 on recheck at home with a digital monitor after sitting for 30 minutes. No c/o dizziness, chest pain or sob.  Advised patient to continue monitoring her BP and HR and if he HR continues to be elevated or get higher than 101, to contact our office. Verbalized understanding.

## 2018-04-05 DIAGNOSIS — K210 Gastro-esophageal reflux disease with esophagitis: Secondary | ICD-10-CM | POA: Diagnosis not present

## 2018-04-05 DIAGNOSIS — E782 Mixed hyperlipidemia: Secondary | ICD-10-CM | POA: Diagnosis not present

## 2018-04-05 DIAGNOSIS — I480 Paroxysmal atrial fibrillation: Secondary | ICD-10-CM | POA: Diagnosis not present

## 2018-04-05 DIAGNOSIS — I10 Essential (primary) hypertension: Secondary | ICD-10-CM | POA: Diagnosis not present

## 2018-04-05 DIAGNOSIS — I471 Supraventricular tachycardia: Secondary | ICD-10-CM | POA: Diagnosis not present

## 2018-04-11 DIAGNOSIS — I10 Essential (primary) hypertension: Secondary | ICD-10-CM | POA: Diagnosis not present

## 2018-04-11 DIAGNOSIS — Z0001 Encounter for general adult medical examination with abnormal findings: Secondary | ICD-10-CM | POA: Diagnosis not present

## 2018-04-11 DIAGNOSIS — Z6821 Body mass index (BMI) 21.0-21.9, adult: Secondary | ICD-10-CM | POA: Diagnosis not present

## 2018-05-08 ENCOUNTER — Telehealth: Payer: Self-pay | Admitting: Cardiology

## 2018-05-08 NOTE — Telephone Encounter (Signed)
Noted.  Would keep track of symptoms.  As she will recall, she took herself off Cardizem CD previously stating that it made her feel worse.  If she is having more frequent breakthrough palpitations (presumably recurrent atrial tachycardia), then we will need to think about starting a different medication.

## 2018-05-08 NOTE — Telephone Encounter (Signed)
Pt voiced understanding

## 2018-05-08 NOTE — Telephone Encounter (Signed)
Patient called stating that she was out in the yard yesterday picking up sticks and started having flutter feelings in her heart.

## 2018-05-08 NOTE — Telephone Encounter (Signed)
Pt says she has had heart fluttering/dizzy - feels like she will pass out during these episodes - pt says this has only happened twice in the last 2 weeks - HR has been 90s-100s - BP 127/78 - 150/84 HR 102 during episodes - episodes lasting only a few minutes - denies any chest pain/SOB/swelling - no recent medication changes

## 2018-05-17 DIAGNOSIS — N3946 Mixed incontinence: Secondary | ICD-10-CM | POA: Diagnosis not present

## 2018-05-24 DIAGNOSIS — Z124 Encounter for screening for malignant neoplasm of cervix: Secondary | ICD-10-CM | POA: Diagnosis not present

## 2018-05-24 DIAGNOSIS — Z01419 Encounter for gynecological examination (general) (routine) without abnormal findings: Secondary | ICD-10-CM | POA: Diagnosis not present

## 2018-05-24 DIAGNOSIS — Z6821 Body mass index (BMI) 21.0-21.9, adult: Secondary | ICD-10-CM | POA: Diagnosis not present

## 2018-06-12 NOTE — Progress Notes (Signed)
Cardiology Office Note  Date: 06/13/2018   ID: Elbert, Polyakov 03-29-1941, MRN [id number]  PCP: Caryl Bis, MD  Primary Cardiologist: Rozann Lesches, MD   Chief Complaint  Patient presents with  . Cardiac follow-up    History of Present Illness: Kristen Mckenzie is a 78 y.o. female last seen in August 2019.  She presents for a follow-up visit.  She describes brief palpitations intermittently but no prolonged episodes of rapid heartbeat.  I went over her home blood pressure and heart rate checks, and most heart rates have been in the 80s to 90s in the last 2 months.  She reports dizziness sometimes when she is bending over picking up sticks in her yard, but no syncope.  We went over her medications which are outlined below.  She continues on valsartan for treatment of hypertension per Dr. Quillian Quince.  As mentioned previously, she did not tolerate Cardizem CD even at low dose at this point is on no AV nodal blockers.  She is still tearful in discussing her late husband, does meet with other widows through her church, but is still grieving in many ways.  Past Medical History:  Diagnosis Date  . Anxiety   . Arthritis   . Cholecystolithiasis   . Collagen vascular disease ([place])   . Cystocele with prolapse   . Ectopic atrial tachycardia ([place])   . Hypertension   . Rectocele     Past Surgical History:  Procedure Laterality Date  . ANTERIOR AND POSTERIOR REPAIR N/A 02/21/2018   Procedure: ANTERIOR (CYSTOCELE) REPAIR;  Surgeon: Bjorn Loser, MD;  Location: [facility];  Service: Urology;  Laterality: N/A;  . CYSTOSCOPY N/A 02/21/2018   Procedure: CYSTOSCOPY;  Surgeon: Bjorn Loser, MD;  Location[facility];  Service: Urology;  Laterality: N/A;  . EYE SURGERY Bilateral 2011   cataract extraction   . LAPAROSCOPIC VAGINAL HYSTERECTOMY WITH SALPINGO OOPHORECTOMY Bilateral 02/21/2018   Procedure: LAPAROSCOPIC ASSISTED VAGINAL HYSTERECTOMY  WITH SALPINGO OOPHORECTOMY;  Surgeon: Maisie Fus, MD;  Location: [facility];  Service: Gynecology;  Laterality: Bilateral;  Dr. Matilde Sprang to follow    Current Outpatient Medications  Medication Sig Dispense Refill  . atorvastatin (LIPITOR) 40 MG tablet Take 20 mg by mouth every other day.     . B-COMPLEX-C PO Take 1 tablet daily by mouth.    . Calcium Carbonate-Vitamin D (CALCARB 600/D PO) Take 1 tablet by mouth daily.     . cholecalciferol (VITAMIN D) 1000 units tablet Take 1,000 Units daily by mouth.    . COD LIVER OIL PO Take 1 capsule by mouth every other day.     Marland Kitchen MAGNESIUM PO Take 1 tablet by mouth once a week.     . Multiple Vitamin (MULTIVITAMIN WITH MINERALS) TABS tablet Take 1 tablet daily by mouth.    . Multiple Vitamins-Minerals (ZINC PO) Take 1 tablet by mouth 2 (two) times a week.     . Potassium 99 MG TABS Take 1 tablet 3 (three) times a week by mouth.    . valsartan (DIOVAN) 40 MG tablet Take 40 mg by mouth daily. Takes 1/2 tablet once a day    . vitamin A 10000 UNIT capsule Take 10,000 Units by mouth 2 (two) times a week.     . vitamin C (ASCORBIC ACID) 500 MG tablet Take 500 mg daily by mouth.    . vitamin E 400 UNIT capsule Take 400 Units by mouth once a  week.     . zolpidem (AMBIEN) 5 MG tablet Take 5 mg by mouth as needed.     No current facility-administered medications for this visit.    Allergies:  Phenobarbital; Tamiflu [oseltamivir phosphate]; Depakote [valproic acid]; and Lisinopril   Social History: The patient  reports that she has never smoked. She has never used smokeless tobacco. She reports that she does not drink alcohol or use drugs.  ROS:  Please see the history of present illness. Otherwise, complete review of systems is positive for none.  All other systems are reviewed and negative.   Physical Exam: VS:  BP (!) 142/82   Pulse 76   Ht 5\' 3"  (1.6 m)   Wt 123 lb (55.8 kg)   SpO2 97%   BMI 21.79 kg/m , BMI Body mass index is  21.79 kg/m.  Wt Readings from Last 3 Encounters:  06/13/18 123 lb (55.8 kg)  02/21/18 123 lb 8 oz (56 kg)  02/16/18 124 lb (56.2 kg)    General: Elderly woman, appears comfortable at rest. HEENT: Conjunctiva and lids normal, oropharynx clear. Neck: Supple, no elevated JVP or carotid bruits, no thyromegaly. Lungs: Clear to auscultation, nonlabored breathing at rest. Cardiac: Regular rate and rhythm, no S3 or significant systolic murmur, no pericardial rub. Abdomen: Soft, nontender, bowel sounds present. Extremities: No pitting edema, distal pulses 2+.  ECG: I personally reviewed the tracing from 02/22/2018 which showed normal sinus rhythm.  Recent Labwork: 02/16/2018: BUN 21; Creatinine, Ser 0.81; Potassium 4.4; Sodium 139 02/22/2018: Hemoglobin 10.1; Platelets 166     Component Value Date/Time   CHOL 222 (H) 03/01/2017 0458   TRIG 109 03/01/2017 0458   HDL 47 03/01/2017 0458   CHOLHDL 4.7 03/01/2017 0458   VLDL 22 03/01/2017 0458   LDLCALC 153 (H) 03/01/2017 0458    Other Studies Reviewed Today:  Echocardiogram11/10/2016: Study Conclusions  - Left ventricle: The cavity size was normal. Wall thickness was increased in a pattern of mild LVH. Systolic function was normal. The estimated ejection fraction was in the range of 60% to 65%. Wall motion was normal; there were no regional wall motion abnormalities. Features are consistent with a pseudonormal left ventricular filling pattern, with concomitant abnormal relaxation and increased filling pressure (grade 2 diastolic dysfunction). - Aortic valve: Mildly calcified annulus. Trileaflet. - Mitral valve: Mildly to moderately calcified annulus. There was trivial regurgitation. - Left atrium: The atrium was at the upper limits of normal in size. - Right atrium: Central venous pressure (est): 8 mm Hg. - Atrial septum: No defect or patent foramen ovale was identified. - Tricuspid valve: There was mild-moderate  regurgitation. - Pulmonary arteries: PA peak pressure: 40 mm Hg (S). - Pericardium, extracardiac: There was no pericardial effusion.  Impressions:  - Mild LVH with LVEF 27-06%CBJ grade 2 diastolic dysfunction. Mild to moderate mitral annular calcification with trivial mitral regurgitation. Upper normal left atrial chamber size. Mild to moderate tricuspid regurgitation with estimated PASP 40 mmHg.  Lexiscan Myoview 03/23/2017:  No diagnostic ST segment changes to indicate ischemia.  No significant myocardial perfusion defects to indicate scar or ischemia.  This is a low risk study.  Nuclear stress EF: 80%.  Assessment and Plan:  1.  Paroxysmal ectopic atrial tachycardia.  She has had no prolonged heart rate elevations based on home vital sign checks, does feel intermittent brief palpitations.  As noted previously she took herself off Cardizem CD and at this point is on no AV nodal blockers.  We will continue  observation for now.  2.  Essential hypertension, continues on valsartan.  Current medicines were reviewed with the patient today.  Disposition: Follow-up in 6 months.  Signed, Satira Sark, MD, [facility] 06/13/2018 11:33 AM    [facility] [place], [place], [place] [address] Phone: [phone]; Fax: [phone]

## 2018-06-13 ENCOUNTER — Encounter: Payer: Self-pay | Admitting: Cardiology

## 2018-06-13 ENCOUNTER — Ambulatory Visit (INDEPENDENT_AMBULATORY_CARE_PROVIDER_SITE_OTHER): Payer: Medicare Other | Admitting: Cardiology

## 2018-06-13 VITALS — BP 142/82 | HR 76 | Ht 63.0 in | Wt 123.0 lb

## 2018-06-13 DIAGNOSIS — I471 Supraventricular tachycardia: Secondary | ICD-10-CM

## 2018-06-13 DIAGNOSIS — I10 Essential (primary) hypertension: Secondary | ICD-10-CM | POA: Diagnosis not present

## 2018-06-13 NOTE — Patient Instructions (Addendum)

## 2018-06-22 ENCOUNTER — Other Ambulatory Visit: Payer: Self-pay | Admitting: Cardiology

## 2018-06-22 MED ORDER — VALSARTAN 40 MG PO TABS: 40.0000 mg | ORAL_TABLET | Freq: Every day | ORAL | 0 refills | Status: DC

## 2018-06-22 NOTE — Telephone Encounter (Signed)
Refill sent to [facility].

## 2018-06-22 NOTE — Telephone Encounter (Signed)
°  1. Which medications need to be refilled? (please list name of each medication and dose if known) valsartan (DIOVAN) 40 MG tablet    2. Which pharmacy/location (including street and city if local pharmacy) is medication to be sent to? [facility]  [phone] fax #   (phone # [phone])   3. Do they need a 30 day or 90 day supply? 90 day supply  Patient does not  want genetic.

## 2018-06-25 ENCOUNTER — Other Ambulatory Visit: Payer: Self-pay | Admitting: *Deleted

## 2018-06-25 MED ORDER — VALSARTAN 40 MG PO TABS: 20.0000 mg | ORAL_TABLET | Freq: Every day | ORAL | 3 refills | Status: DC

## 2018-09-13 ENCOUNTER — Telehealth: Payer: Self-pay | Admitting: Cardiology

## 2018-09-13 NOTE — Telephone Encounter (Signed)
BP readings have been - she has been concerned with these and would like to see what they can do  145/73  HR 83  156/77  HR 78 166/78  Hr

## 2018-09-13 NOTE — Telephone Encounter (Signed)
Spoke with patient and she reports that all of the below BP readings were checked at random times today. Also c/o feeling palpitations last night and today that lasted for a split second and says it makes her feel like she is going to pass out. Reports having last palpitations 2 months ago. Reports eating a large piece of chocolate last night. Denies chest pain, sob. Advised to avoid caffeine products, continue monitoring BP and symptoms. Advised if symptoms get worse, to contact our office. Advised if she develop chest pain or sob, to go to the ED for an evaluation. Medications reviewed. Verbalized understanding.

## 2018-10-19 DIAGNOSIS — N390 Urinary tract infection, site not specified: Secondary | ICD-10-CM | POA: Diagnosis not present

## 2018-10-19 DIAGNOSIS — R311 Benign essential microscopic hematuria: Secondary | ICD-10-CM | POA: Diagnosis not present

## 2018-10-19 DIAGNOSIS — R3121 Asymptomatic microscopic hematuria: Secondary | ICD-10-CM | POA: Diagnosis not present

## 2018-10-19 DIAGNOSIS — R350 Frequency of micturition: Secondary | ICD-10-CM | POA: Diagnosis not present

## 2018-10-24 DIAGNOSIS — K210 Gastro-esophageal reflux disease with esophagitis: Secondary | ICD-10-CM | POA: Diagnosis not present

## 2018-10-24 DIAGNOSIS — I10 Essential (primary) hypertension: Secondary | ICD-10-CM | POA: Diagnosis not present

## 2018-10-24 DIAGNOSIS — I480 Paroxysmal atrial fibrillation: Secondary | ICD-10-CM | POA: Diagnosis not present

## 2018-10-24 DIAGNOSIS — E782 Mixed hyperlipidemia: Secondary | ICD-10-CM | POA: Diagnosis not present

## 2018-10-31 DIAGNOSIS — R002 Palpitations: Secondary | ICD-10-CM | POA: Diagnosis not present

## 2018-10-31 DIAGNOSIS — R3121 Asymptomatic microscopic hematuria: Secondary | ICD-10-CM | POA: Diagnosis not present

## 2018-10-31 DIAGNOSIS — R3129 Other microscopic hematuria: Secondary | ICD-10-CM | POA: Diagnosis not present

## 2018-10-31 DIAGNOSIS — Z6822 Body mass index (BMI) 22.0-22.9, adult: Secondary | ICD-10-CM | POA: Diagnosis not present

## 2018-10-31 DIAGNOSIS — I10 Essential (primary) hypertension: Secondary | ICD-10-CM | POA: Diagnosis not present

## 2018-10-31 DIAGNOSIS — M19049 Primary osteoarthritis, unspecified hand: Secondary | ICD-10-CM | POA: Diagnosis not present

## 2018-10-31 DIAGNOSIS — F331 Major depressive disorder, recurrent, moderate: Secondary | ICD-10-CM | POA: Diagnosis not present

## 2018-10-31 DIAGNOSIS — E782 Mixed hyperlipidemia: Secondary | ICD-10-CM | POA: Diagnosis not present

## 2018-11-01 DIAGNOSIS — R310 Gross hematuria: Secondary | ICD-10-CM | POA: Diagnosis not present

## 2018-12-21 ENCOUNTER — Telehealth: Payer: Self-pay | Admitting: *Deleted

## 2018-12-21 ENCOUNTER — Other Ambulatory Visit: Payer: Self-pay

## 2018-12-21 ENCOUNTER — Other Ambulatory Visit: Payer: Self-pay | Admitting: *Deleted

## 2018-12-21 ENCOUNTER — Encounter: Payer: Self-pay | Admitting: Cardiology

## 2018-12-21 ENCOUNTER — Ambulatory Visit (INDEPENDENT_AMBULATORY_CARE_PROVIDER_SITE_OTHER): Payer: Medicare Other | Admitting: Cardiology

## 2018-12-21 VITALS — BP 117/65 | HR 89 | Temp 96.4°F | Ht 64.0 in | Wt 126.4 lb

## 2018-12-21 DIAGNOSIS — R011 Cardiac murmur, unspecified: Secondary | ICD-10-CM

## 2018-12-21 DIAGNOSIS — I10 Essential (primary) hypertension: Secondary | ICD-10-CM

## 2018-12-21 DIAGNOSIS — I471 Supraventricular tachycardia: Secondary | ICD-10-CM | POA: Diagnosis not present

## 2018-12-21 MED ORDER — ATORVASTATIN CALCIUM 40 MG PO TABS: ORAL_TABLET | ORAL | 3 refills | Status: DC

## 2018-12-21 NOTE — Patient Instructions (Addendum)
Medication Instructions:   Your physician recommends that you continue on your current medications as directed. Please refer to the Current Medication list given to you today.  Labwork:  NONE  Testing/Procedures: Your physician has requested that you have an echocardiogram. Echocardiography is a painless test that uses sound waves to create images of your heart. It provides your doctor with information about the size and shape of your heart and how well your heart's chambers and valves are working. This procedure takes approximately one hour. There are no restrictions for this procedure.  Follow-Up:  Your physician recommends that you schedule a follow-up appointment in: 6 month. You will receive a reminder letter in the mail in about 4 months reminding you to call and schedule your appointment. If you don't receive this letter, please contact our office.  Any Other Special Instructions Will Be Listed Below (If Applicable).  If you need a refill on your cardiac medications before your next appointment, please call your pharmacy.

## 2018-12-21 NOTE — Telephone Encounter (Signed)

## 2018-12-21 NOTE — Progress Notes (Signed)
Cardiology Office Note  Date: 12/21/2018   ID: Sharl, Ahlquist 09-Jan-1941, MRN [id number]  PCP:  Caryl Bis, MD  Cardiologist:  Rozann Lesches, MD Electrophysiologist:  None   Chief Complaint  Patient presents with   Cardiac follow-up    History of Present Illness: Kristen Mckenzie is a 78 y.o. female last seen in February.  She presents for a routine visit.  She describes [place] occasional brief feelings of palpitations with lightheadedness, no syncope.  She goes out for a walk almost every morning, 15 to 20 minutes, and generally feels well with this.  She did not tolerate Cardizem CD previously even at low dose and is not on any AV nodal blockers.  She has been checking her blood pressure with a cuff at home, states that systolics have been in the 140s to 160s, but her blood pressure was normal by our cuff today.  She continues on Diovan with follow-up by Dr. Quillian Quince.  She limits sodium and watches her diet.  I personally reviewed her ECG today which shows sinus rhythm with low voltage.  Past Medical History:  Diagnosis Date   Anxiety    Arthritis    Cholecystolithiasis    Collagen vascular disease ([place])    Cystocele with prolapse    Ectopic atrial tachycardia ([place])    Hypertension    Rectocele     Past Surgical History:  Procedure Laterality Date   ANTERIOR AND POSTERIOR REPAIR N/A 02/21/2018   Procedure: ANTERIOR (CYSTOCELE) REPAIR;  Surgeon: Bjorn Loser, MD;  Location: [facility];  Service: Urology;  Laterality: N/A;   CYSTOSCOPY N/A 02/21/2018   Procedure: CYSTOSCOPY;  Surgeon: Bjorn Loser, MD;  Location: [facility];  Service: Urology;  Laterality: N/A;   EYE SURGERY Bilateral 2011   cataract extraction    LAPAROSCOPIC VAGINAL HYSTERECTOMY WITH SALPINGO OOPHORECTOMY Bilateral 02/21/2018   Procedure: LAPAROSCOPIC ASSISTED VAGINAL HYSTERECTOMY WITH SALPINGO OOPHORECTOMY;  Surgeon: Maisie Fus, MD;   Location: [facility];  Service: Gynecology;  Laterality: Bilateral;  Dr. Matilde Sprang to follow    Current Outpatient Medications  Medication Sig Dispense Refill   atorvastatin (LIPITOR) 40 MG tablet Take 20 mg by mouth on Monday, Wednesday, Friday 36 tablet 3   B-COMPLEX-C PO Take 1 tablet daily by mouth.     Calcium Carbonate-Vitamin D (CALCARB 600/D PO) Take 1 tablet by mouth daily.      cholecalciferol (VITAMIN D) 1000 units tablet Take 1,000 Units daily by mouth.     COD LIVER OIL PO Take 1 capsule by mouth every other day.      MAGNESIUM PO Take 1 tablet by mouth once a week.      Multiple Vitamin (MULTIVITAMIN WITH MINERALS) TABS tablet Take 1 tablet daily by mouth.     Multiple Vitamins-Minerals (ZINC PO) Take 1 tablet by mouth 2 (two) times a week.      Potassium 99 MG TABS Take 1 tablet 3 (three) times a week by mouth.     valsartan (DIOVAN) 40 MG tablet Take 0.5 tablets (20 mg total) by mouth daily. 45 tablet 3   vitamin A 10000 UNIT capsule Take 10,000 Units by mouth 2 (two) times a week.      vitamin C (ASCORBIC ACID) 500 MG tablet Take 500 mg daily by mouth.     vitamin E 400 UNIT capsule Take 400 Units by mouth once a week.      zolpidem (AMBIEN) 5 MG  tablet Take 5 mg by mouth as needed.     No current facility-administered medications for this visit.    Allergies:  Phenobarbital, Tamiflu [oseltamivir phosphate], Depakote [valproic acid], and Lisinopril   Social History: The patient  reports that she has never smoked. She has never used smokeless tobacco. She reports that she does not drink alcohol or use drugs.   ROS:  Please see the history of present illness. Otherwise, complete review of systems is positive for none.  All other systems are reviewed and negative.   Physical Exam: VS:  BP 117/65 Comment: our dinamap   Pulse 89    Temp (!) 96.4 F (35.8 C)    Ht 5\' 4"  (1.626 m)    Wt 126 lb 6.4 oz (57.3 kg)    SpO2 97%    BMI 21.70 kg/m , BMI  Body mass index is 21.7 kg/m.  Wt Readings from Last 3 Encounters:  12/21/18 126 lb 6.4 oz (57.3 kg)  06/13/18 123 lb (55.8 kg)  02/21/18 123 lb 8 oz (56 kg)    General: Elderly woman, appears comfortable at rest. HEENT: Conjunctiva and lids normal, wearing a mask. Neck: Supple, no elevated JVP or carotid bruits, no thyromegaly. Lungs: Clear to auscultation, nonlabored breathing at rest. Cardiac: Regular rate and rhythm, no S3, 99991111 systolic murmur, no pericardial rub. Abdomen: Soft, nontender, bowel sounds present. Extremities: No pitting edema, distal pulses 2+.  ECG:  An ECG dated 02/22/2018 was personally reviewed today and demonstrated:  Normal sinus rhythm.  Recent Labwork: 02/16/2018: BUN 21; Creatinine, Ser 0.81; Potassium 4.4; Sodium 139 02/22/2018: Hemoglobin 10.1; Platelets 166     Component Value Date/Time   CHOL 222 (H) 03/01/2017 0458   TRIG 109 03/01/2017 0458   HDL 47 03/01/2017 0458   CHOLHDL 4.7 03/01/2017 0458   VLDL 22 03/01/2017 0458   LDLCALC 153 (H) 03/01/2017 0458    Other Studies Reviewed Today:  Echocardiogram11/10/2016: Study Conclusions  - Left ventricle: The cavity size was normal. Wall thickness was increased in a pattern of mild LVH. Systolic function was normal. The estimated ejection fraction was in the range of 60% to 65%. Wall motion was normal; there were no regional wall motion abnormalities. Features are consistent with a pseudonormal left ventricular filling pattern, with concomitant abnormal relaxation and increased filling pressure (grade 2 diastolic dysfunction). - Aortic valve: Mildly calcified annulus. Trileaflet. - Mitral valve: Mildly to moderately calcified annulus. There was trivial regurgitation. - Left atrium: The atrium was at the upper limits of normal in size. - Right atrium: Central venous pressure (est): 8 mm Hg. - Atrial septum: No defect or patent foramen ovale was identified. - Tricuspid  valve: There was mild-moderate regurgitation. - Pulmonary arteries: PA peak pressure: 40 mm Hg (S). - Pericardium, extracardiac: There was no pericardial effusion.  Impressions:  - Mild LVH with LVEF [id number] grade 2 diastolic dysfunction. Mild to moderate mitral annular calcification with trivial mitral regurgitation. Upper normal left atrial chamber size. Mild to moderate tricuspid regurgitation with estimated PASP 40 mmHg.  Lexiscan Myoview 03/23/2017:  No diagnostic ST segment changes to indicate ischemia.  No significant myocardial perfusion defects to indicate scar or ischemia.  This is a low risk study.  Nuclear stress EF: 80%.  Assessment and Plan:  1.  Paroxysmal ectopic atrial tachycardia.  She reports intermittent symptoms, did not tolerate Cardizem CD previously and at this point is not on any AV nodal blockers.  ECG shows sinus rhythm today.  Continue observation.  2.  Systolic murmur, more prominent.  We will obtain a follow-up echocardiogram in comparison to study from 2018.  3.  Central hypertension, blood pressure is normal today by our check.  Continue Diovan with follow-up per Dr. Quillian Quince.  Medication Adjustments/Labs and Tests Ordered: Current medicines are reviewed at length with the patient today.  Concerns regarding medicines are outlined above.   Tests Ordered: Orders Placed This Encounter  Procedures   EKG 12-Lead   ECHOCARDIOGRAM COMPLETE    Medication Changes: Meds ordered this encounter  Medications   atorvastatin (LIPITOR) 40 MG tablet    Sig: Take 20 mg by mouth on Monday, Wednesday, Friday    Dispense:  36 tablet    Refill:  3    Disposition:  Follow up 6 months in the [place] office.   Signed, Satira Sark, MD, [facility] 12/21/2018 11:31 AM    [facility] [place], [place], [place] [address] Phone: [phone]; Fax: [phone]

## 2019-01-07 DIAGNOSIS — M9903 Segmental and somatic dysfunction of lumbar region: Secondary | ICD-10-CM | POA: Diagnosis not present

## 2019-01-07 DIAGNOSIS — M47816 Spondylosis without myelopathy or radiculopathy, lumbar region: Secondary | ICD-10-CM | POA: Diagnosis not present

## 2019-01-11 DIAGNOSIS — M9903 Segmental and somatic dysfunction of lumbar region: Secondary | ICD-10-CM | POA: Diagnosis not present

## 2019-01-11 DIAGNOSIS — M47816 Spondylosis without myelopathy or radiculopathy, lumbar region: Secondary | ICD-10-CM | POA: Diagnosis not present

## 2019-01-16 ENCOUNTER — Encounter

## 2019-01-16 ENCOUNTER — Other Ambulatory Visit: Payer: Self-pay

## 2019-01-16 ENCOUNTER — Ambulatory Visit (INDEPENDENT_AMBULATORY_CARE_PROVIDER_SITE_OTHER): Payer: Medicare Other

## 2019-01-16 DIAGNOSIS — R011 Cardiac murmur, unspecified: Secondary | ICD-10-CM

## 2019-01-17 ENCOUNTER — Telehealth: Payer: Self-pay | Admitting: *Deleted

## 2019-01-17 NOTE — Telephone Encounter (Signed)
-----   Message from Satira Sark, MD sent at 01/17/2019  8:23 AM EDT ----- Results reviewed.  LVEF is vigorous at 70 to 75% and heart murmur is related to mild LVOT gradient across hypertrophied basal septum.  No aortic stenosis.  We will continue to follow clinically, no changes to current medications.

## 2019-01-21 DIAGNOSIS — M47816 Spondylosis without myelopathy or radiculopathy, lumbar region: Secondary | ICD-10-CM | POA: Diagnosis not present

## 2019-01-21 DIAGNOSIS — M9903 Segmental and somatic dysfunction of lumbar region: Secondary | ICD-10-CM | POA: Diagnosis not present

## 2019-01-22 NOTE — Telephone Encounter (Signed)
Patient informed. Copy sent to PCP °

## 2019-01-24 DIAGNOSIS — M9903 Segmental and somatic dysfunction of lumbar region: Secondary | ICD-10-CM | POA: Diagnosis not present

## 2019-01-24 DIAGNOSIS — M47816 Spondylosis without myelopathy or radiculopathy, lumbar region: Secondary | ICD-10-CM | POA: Diagnosis not present

## 2019-01-31 DIAGNOSIS — M9903 Segmental and somatic dysfunction of lumbar region: Secondary | ICD-10-CM | POA: Diagnosis not present

## 2019-01-31 DIAGNOSIS — M47816 Spondylosis without myelopathy or radiculopathy, lumbar region: Secondary | ICD-10-CM | POA: Diagnosis not present

## 2019-02-05 DIAGNOSIS — M47816 Spondylosis without myelopathy or radiculopathy, lumbar region: Secondary | ICD-10-CM | POA: Diagnosis not present

## 2019-02-05 DIAGNOSIS — M9903 Segmental and somatic dysfunction of lumbar region: Secondary | ICD-10-CM | POA: Diagnosis not present

## 2019-02-06 ENCOUNTER — Encounter (HOSPITAL_COMMUNITY): Payer: Self-pay | Admitting: Emergency Medicine

## 2019-02-06 ENCOUNTER — Inpatient Hospital Stay (HOSPITAL_COMMUNITY)
Admission: EM | Admit: 2019-02-06 | Discharge: 2019-02-08 | DRG: 287 | Disposition: A | Discharge status: Home / Self Care | Payer: Medicare Other | Attending: Cardiology | Admitting: Cardiology

## 2019-02-06 ENCOUNTER — Other Ambulatory Visit: Payer: Self-pay

## 2019-02-06 ENCOUNTER — Emergency Department (HOSPITAL_COMMUNITY): Payer: Medicare Other

## 2019-02-06 DIAGNOSIS — M1990 Unspecified osteoarthritis, unspecified site: Secondary | ICD-10-CM | POA: Diagnosis not present

## 2019-02-06 DIAGNOSIS — I471 Supraventricular tachycardia: Secondary | ICD-10-CM | POA: Diagnosis not present

## 2019-02-06 DIAGNOSIS — Z7901 Long term (current) use of anticoagulants: Secondary | ICD-10-CM | POA: Diagnosis not present

## 2019-02-06 DIAGNOSIS — Z888 Allergy status to other drugs, medicaments and biological substances status: Secondary | ICD-10-CM

## 2019-02-06 DIAGNOSIS — I4891 Unspecified atrial fibrillation: Secondary | ICD-10-CM | POA: Diagnosis not present

## 2019-02-06 DIAGNOSIS — R7989 Other specified abnormal findings of blood chemistry: Secondary | ICD-10-CM

## 2019-02-06 DIAGNOSIS — R002 Palpitations: Secondary | ICD-10-CM | POA: Diagnosis not present

## 2019-02-06 DIAGNOSIS — Z8249 Family history of ischemic heart disease and other diseases of the circulatory system: Secondary | ICD-10-CM

## 2019-02-06 DIAGNOSIS — I214 Non-ST elevation (NSTEMI) myocardial infarction: Secondary | ICD-10-CM

## 2019-02-06 DIAGNOSIS — Z20828 Contact with and (suspected) exposure to other viral communicable diseases: Secondary | ICD-10-CM | POA: Diagnosis not present

## 2019-02-06 DIAGNOSIS — Z79899 Other long term (current) drug therapy: Secondary | ICD-10-CM | POA: Diagnosis not present

## 2019-02-06 DIAGNOSIS — I10 Essential (primary) hypertension: Secondary | ICD-10-CM | POA: Diagnosis not present

## 2019-02-06 DIAGNOSIS — I252 Old myocardial infarction: Secondary | ICD-10-CM

## 2019-02-06 DIAGNOSIS — R778 Other specified abnormalities of plasma proteins: Secondary | ICD-10-CM | POA: Diagnosis not present

## 2019-02-06 DIAGNOSIS — E785 Hyperlipidemia, unspecified: Secondary | ICD-10-CM | POA: Diagnosis present

## 2019-02-06 DIAGNOSIS — I248 Other forms of acute ischemic heart disease: Secondary | ICD-10-CM | POA: Diagnosis not present

## 2019-02-06 LAB — CBC WITH DIFFERENTIAL/PLATELET
Abs Immature Granulocytes: 0.02 10*3/uL (ref 0.00–0.07)
Basophils Absolute: 0.1 10*3/uL (ref 0.0–0.1)
Basophils Relative: 1 %
Eosinophils Absolute: 0.1 10*3/uL (ref 0.0–0.5)
Eosinophils Relative: 1 %
HCT: 38.1 % (ref 36.0–46.0)
Hemoglobin: 12.7 g/dL (ref 12.0–15.0)
Immature Granulocytes: 0 %
Lymphocytes Relative: 17 %
Lymphs Abs: 1.6 10*3/uL (ref 0.7–4.0)
MCH: 32.4 pg (ref 26.0–34.0)
MCHC: 33.3 g/dL (ref 30.0–36.0)
MCV: 97.2 fL (ref 80.0–100.0)
Monocytes Absolute: 0.7 10*3/uL (ref 0.1–1.0)
Monocytes Relative: 7 %
Neutro Abs: 6.6 10*3/uL (ref 1.7–7.7)
Neutrophils Relative %: 74 %
Platelets: 220 10*3/uL (ref 150–400)
RBC: 3.92 MIL/uL (ref 3.87–5.11)
RDW: 13.1 % (ref 11.5–15.5)
WBC: 9 10*3/uL (ref 4.0–10.5)
nRBC: 0 % (ref 0.0–0.2)

## 2019-02-06 LAB — BASIC METABOLIC PANEL
Anion gap: 9 (ref 5–15)
BUN: 19 mg/dL (ref 8–23)
CO2: 24 mmol/L (ref 22–32)
Calcium: 9 mg/dL (ref 8.9–10.3)
Chloride: 104 mmol/L (ref 98–111)
Creatinine, Ser: 0.98 mg/dL (ref 0.44–1.00)
GFR calc Af Amer: >60 mL/min (ref >60)
GFR calc non Af Amer: 55 mL/min — ABNORMAL LOW (ref >60)
Glucose, Bld: 125 mg/dL — ABNORMAL HIGH (ref 70–99)
Potassium: 4.1 mmol/L (ref 3.5–5.1)
Sodium: 137 mmol/L (ref 135–145)

## 2019-02-06 LAB — TROPONIN I (HIGH SENSITIVITY)
Troponin I (High Sensitivity): 30 ng/L — ABNORMAL HIGH (ref <18)
Troponin I (High Sensitivity): 1397 ng/L (ref <18)
Troponin I (High Sensitivity): 6543 ng/L (ref <18)

## 2019-02-06 LAB — TSH: TSH: 2.815 u[IU]/mL (ref 0.350–4.500)

## 2019-02-06 MED ORDER — ATORVASTATIN CALCIUM 10 MG PO TABS
20.0000 mg | ORAL_TABLET | Freq: Every day | ORAL | Status: DC
  Administered 2019-02-07: 20 mg via ORAL
  Filled 2019-02-06: qty 1
  Filled 2019-02-06: qty 2

## 2019-02-06 MED ORDER — ZOLPIDEM TARTRATE 5 MG PO TABS
5.0000 mg | ORAL_TABLET | Freq: Every evening | ORAL | Status: DC | PRN
  Administered 2019-02-07: 5 mg via ORAL
  Administered 2019-02-07: 2.5 mg via ORAL
  Filled 2019-02-06 (×2): qty 1

## 2019-02-06 MED ORDER — APIXABAN 5 MG PO TABS: 5.0000 mg | ORAL_TABLET | Freq: Two times a day (BID) | ORAL | 0 refills | Status: DC

## 2019-02-06 MED ORDER — METOPROLOL TARTRATE 25 MG PO TABS: 25.0000 mg | ORAL_TABLET | Freq: Once | ORAL | 0 refills | Status: DC | PRN

## 2019-02-06 MED ORDER — ASPIRIN 81 MG PO CHEW
324.0000 mg | CHEWABLE_TABLET | Freq: Once | ORAL | Status: AC
  Administered 2019-02-06: 324 mg via ORAL
  Filled 2019-02-06: qty 4

## 2019-02-06 MED ORDER — PROPOFOL 10 MG/ML IV BOLUS
0.5000 mg/kg | Freq: Once | INTRAVENOUS | Status: DC
  Filled 2019-02-06: qty 20

## 2019-02-06 MED ORDER — FENTANYL CITRATE (PF) 100 MCG/2ML IJ SOLN
50.0000 ug | Freq: Once | INTRAMUSCULAR | Status: AC
  Administered 2019-02-06: 50 ug via INTRAVENOUS
  Filled 2019-02-06: qty 2

## 2019-02-06 MED ORDER — METOPROLOL TARTRATE 5 MG/5ML IV SOLN
5.0000 mg | Freq: Once | INTRAVENOUS | Status: AC
  Administered 2019-02-06: 5 mg via INTRAVENOUS
  Filled 2019-02-06: qty 5

## 2019-02-06 MED ORDER — APIXABAN 5 MG PO TABS
5.0000 mg | ORAL_TABLET | Freq: Two times a day (BID) | ORAL | Status: DC
  Administered 2019-02-06: 5 mg via ORAL
  Filled 2019-02-06 (×2): qty 1

## 2019-02-06 MED ORDER — MIDAZOLAM HCL 2 MG/2ML IJ SOLN
4.0000 mg | Freq: Once | INTRAMUSCULAR | Status: AC
  Administered 2019-02-06: 2 mg via INTRAVENOUS
  Filled 2019-02-06: qty 4

## 2019-02-06 NOTE — ED Notes (Signed)
Pt ambulated well around room. Stated she "felt funny like she was just waking up".

## 2019-02-06 NOTE — ED Provider Notes (Addendum)
[facility] EMERGENCY DEPARTMENT Provider Note   CSN: [id number] Arrival date & time: 02/06/19  1254     History   Chief Complaint Chief Complaint  Patient presents with  . Blurred Vision    HPI Kristen Mckenzie is a 78 y.o. female.     HPI  This patient is a 78 year old female, very pleasant, history of "ectopic atrial tachycardia", in 2018 she had presented to the hospital with what appeared to be supraventricular tachycardia during which time the patient underwent Cardizem therapy after adenosine and was admitted to the hospital with successful chemical cardioversion.  She since that time is felt occasional palpitations and is followed by Dr. Domenic Polite with the cardiology service due to these ectopic atrial rhythms.  She states that today while she was helping to pack close into a bend approximately 30 minutes prior to arrival she felt the acute onset of palpitations and a racing heartbeat.  This has been continual, it is made her feel severely fatigued and weak, she has had blurred vision and had a near syncopal episode with it as well.  She denies focal chest pain, there is been no recent coughing shortness of breath or fevers and denies any swelling of the legs.  She takes only an angiotensin receptor blocker but does not take any AV nodal blocking agents.  She takes an aspirin every other day.  Review of the medical record shows that the patient has not had electrical cardioversion in the past and has never had atrial fibrillation.  It also shows that she had an echocardiogram approximately 3 weeks ago with a normal ejection fractionAnd no obvious and no obvious significant valve abnormalities.  Past Medical History:  Diagnosis Date  . Anxiety   . Arthritis   . Cholecystolithiasis   . Collagen vascular disease ([place])   . Cystocele with prolapse   . Ectopic atrial tachycardia ([place])   . Hypertension   . Rectocele     Patient Active Problem List   Diagnosis Date Noted  .  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 Tachycardia 02/28/2017  . Essential hypertension 02/28/2017  . Acute hyperglycemia 02/28/2017  . 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 ANTERIOR AND POSTERIOR REPAIR N/A 02/21/2018   Procedure: ANTERIOR (CYSTOCELE) REPAIR;  Surgeon: Bjorn Loser, MD;  Location: [place];  Service: Urology;  Laterality: N/A;  . CYSTOSCOPY N/A 02/21/2018   Procedure: CYSTOSCOPY;  Surgeon: Bjorn Loser, MD;  Location: [facility];  Service: Urology;  Laterality: N/A;  . EYE SURGERY Bilateral 2011   cataract extraction   . LAPAROSCOPIC VAGINAL HYSTERECTOMY WITH SALPINGO OOPHORECTOMY Bilateral 02/21/2018   Procedure: LAPAROSCOPIC ASSISTED VAGINAL HYSTERECTOMY WITH SALPINGO OOPHORECTOMY;  Surgeon: Maisie Fus, MD;  Location: [facility];  Service: Gynecology;  Laterality: Bilateral;  Dr. Matilde Sprang to follow     OB History   No obstetric history on file.      Home Medications    Prior to Admission medications   Medication Sig Start Date End Date Taking? Authorizing Provider  atorvastatin (LIPITOR) 40 MG tablet Take 20 mg by mouth on Monday, Wednesday, Friday 12/21/18   Satira Sark, MD  B-COMPLEX-C PO Take 1 tablet daily by mouth.    [provider]  Calcium Carbonate-Vitamin D (CALCARB 600/D PO) Take 1 tablet by mouth daily.     [provider]  cholecalciferol (VITAMIN D) 1000 units tablet Take  1,000 Units daily by mouth.    [provider]  COD LIVER OIL PO Take 1 capsule by mouth every other day.     [provider]  MAGNESIUM PO Take 1 tablet by mouth once a week.     [provider]  Multiple Vitamin (MULTIVITAMIN WITH MINERALS) TABS tablet Take 1 tablet daily by mouth.    [provider]  Multiple Vitamins-Minerals (ZINC PO) Take 1 tablet by mouth 2 (two)  times a week.     [provider]  Potassium 99 MG TABS Take 1 tablet 3 (three) times a week by mouth.    [provider]  valsartan (DIOVAN) 40 MG tablet Take 0.5 tablets (20 mg total) by mouth daily. 06/25/18   Satira Sark, MD  vitamin A 10000 UNIT capsule Take 10,000 Units by mouth 2 (two) times a week.     [provider]  vitamin C (ASCORBIC ACID) 500 MG tablet Take 500 mg daily by mouth.    [provider]  vitamin E 400 UNIT capsule Take 400 Units by mouth once a week.     [provider]  zolpidem (AMBIEN) 5 MG tablet Take 5 mg by mouth as needed.    [provider]    Family History Family History  Problem Relation Age of Onset  . Hypertension Father     Social History Social History   Tobacco Use  . Smoking status: Never Smoker  . Smokeless tobacco: Never Used  Substance Use Topics  . Alcohol use: No    Frequency: Never  . Drug use: No     Allergies   Phenobarbital, Tamiflu [oseltamivir phosphate], Depakote [valproic acid], and Lisinopril   Review of Systems Review of Systems  All other systems reviewed and are negative.    Physical Exam Updated Vital Signs BP (!) 123/97 (BP Location: Left Arm)   Pulse (!) 164   Temp (!) 97.4 F (36.3 C) (Oral)   Resp (!) 24   Ht 1.6 m (5\' 3" )   Wt 55.3 kg   SpO2 95%   BMI 21.61 kg/m   Physical Exam Vitals signs and nursing note reviewed.  Constitutional:      General: She is in acute distress.     Appearance: She is well-developed.  HENT:     Head: Normocephalic and atraumatic.     Mouth/Throat:     Pharynx: No oropharyngeal exudate.  Eyes:     General: No scleral icterus.       Right eye: No discharge.        Left eye: No discharge.     Conjunctiva/sclera: Conjunctivae normal.     Pupils: Pupils are equal, round, and reactive to light.  Neck:     Musculoskeletal: Normal range of motion and neck supple.     Thyroid: No thyromegaly.     Vascular:  No JVD.  Cardiovascular:     Rate and Rhythm: Tachycardia present. Rhythm irregular.     Heart sounds: Normal heart sounds. No murmur. No friction rub. No gallop.      Comments: Atrial fibrillation with a rapid ventricular rate at approximately 150 to 170 bpm, thready pulses, no edema, no JVD Pulmonary:     Effort: Pulmonary effort is normal. No respiratory distress.     Breath sounds: Normal breath sounds. No wheezing or rales.  Abdominal:     General: Bowel sounds are normal. There is no distension.     Palpations: Abdomen  is soft. There is no mass.     Tenderness: There is no abdominal tenderness.  Musculoskeletal: Normal range of motion.        General: No tenderness.  Lymphadenopathy:     Cervical: No cervical adenopathy.  Skin:    General: Skin is warm and dry.     Findings: No erythema or rash.  Neurological:     General: No focal deficit present.     Mental Status: She is alert. Mental status is at baseline.     Coordination: Coordination normal.  Psychiatric:        Behavior: Behavior normal.      ED Treatments / Results  Labs (all labs ordered are listed, but only abnormal results are displayed) Labs Reviewed  TSH  CBC WITH DIFFERENTIAL/PLATELET  BASIC METABOLIC PANEL  TROPONIN I (HIGH SENSITIVITY)    EKG EKG Interpretation  Date/Time:  Wednesday February 06 2019 13:03:22 EDT Ventricular Rate:  151 PR Interval:    QRS Duration: 75 QT Interval:  293 QTC Calculation: 465 R Axis:   31 Text Interpretation:  Atrial fibrillation with rapid V-rate Repolarization abnormality, prob rate related Since last tracing afib n ow present Confirmed by Noemi Chapel [phone]) on 02/06/2019 1:09:22 PM   EKG Interpretation  Date/Time:  Wednesday February 06 2019 14:05:57 EDT Ventricular Rate:  67 PR Interval:    QRS Duration: 93 QT Interval:  419 QTC Calculation: 443 R Axis:   35 Text Interpretation:  Sinus rhythm Atrial premature complex Abnormal R-wave progression,  early transition since last tracing - afib has resolved as have the ST abnormalities. Confirmed by Noemi Chapel [phone]) on 02/06/2019 2:13:42 PM        Radiology Dg Chest Port 1 View  Result Date: 02/06/2019 CLINICAL DATA:  Sudden onset palpitations and blurry vision. EXAM: PORTABLE CHEST 1 VIEW COMPARISON:  Chest x-ray dated February 28, 2017. FINDINGS: The heart size and mediastinal contours are within normal limits. Both lungs are clear. The visualized skeletal structures are unremarkable. IMPRESSION: No active disease. Electronically Signed   By: Titus Dubin M.D.   On: 02/06/2019 14:00    Procedures .Sedation  Date/Time: 02/06/2019 2:15 PM Performed by: Noemi Chapel, MD Authorized by: Noemi Chapel, MD   Consent:    Consent obtained:  Written   Consent given by:  Patient   Risks discussed:  Allergic reaction, dysrhythmia, inadequate sedation, nausea, prolonged hypoxia resulting in organ damage, prolonged sedation necessitating reversal, respiratory compromise necessitating ventilatory assistance and intubation and vomiting Indications:    Procedure performed:  Cardioversion   Procedure necessitating sedation performed by:  Physician performing sedation Pre-sedation assessment:    Time since last food or drink:  2   ASA classification: class 2 - patient with mild systemic disease     Neck mobility: normal     Mouth opening:  3 or more finger widths   Thyromental distance:  3 finger widths   Mallampati score:  II - soft palate, uvula, fauces visible   Pre-sedation assessments completed and reviewed: airway patency, cardiovascular function, hydration status, mental status, nausea/vomiting, pain level, respiratory function and temperature     Pre-sedation assessment completed:  02/06/2019 1:55 PM Immediate pre-procedure details:    Reassessment: Patient reassessed immediately prior to procedure     Reviewed: vital signs, relevant labs/tests and NPO status     Verified: bag  valve mask available, emergency equipment available, intubation equipment available, IV patency confirmed, oxygen available and reversal medications available   Procedure details (  see MAR for exact dosages):    Preoxygenation:  Nasal cannula   Sedation:  Midazolam   Intended level of sedation: deep   Analgesia:  Fentanyl   Intra-procedure monitoring:  Blood pressure monitoring, cardiac monitor, continuous capnometry, continuous pulse oximetry, frequent LOC assessments and frequent vital sign checks   Intra-procedure events: respiratory depression     Intra-procedure management:  Airway repositioning   Total Provider sedation time (minutes):  16 Post-procedure details:    Post-sedation assessment completed:  02/06/2019 2:16 PM   Attendance: Constant attendance by certified staff until patient recovered     Recovery: Patient returned to pre-procedure baseline     Post-sedation assessments completed and reviewed: airway patency, cardiovascular function, hydration status, mental status, nausea/vomiting, pain level, respiratory function and temperature     Patient is stable for discharge or admission: yes     Patient tolerance:  Tolerated well, no immediate complications Comments:       .Cardioversion  Date/Time: 02/06/2019 2:17 PM Performed by: Noemi Chapel, MD Authorized by: Noemi Chapel, MD   Consent:    Consent obtained:  Written   Consent given by:  Patient   Risks discussed:  Cutaneous burn, death, induced arrhythmia and pain   Alternatives discussed:  Rate-control medication and alternative treatment Pre-procedure details:    Cardioversion basis:  Elective   Rhythm:  Atrial fibrillation   Electrode placement:  Anterior-posterior Patient sedated: Yes. Refer to sedation procedure documentation for details of sedation.  Attempt one:    Cardioversion mode:  Synchronous   Waveform:  Biphasic   Shock (Joules):  100   Shock outcome:  Conversion to normal sinus rhythm  Post-procedure details:    Patient status:  Awake   Patient tolerance of procedure:  Tolerated well, no immediate complications Comments:         .Critical Care Performed by: Noemi Chapel, MD Authorized by: Noemi Chapel, MD   Critical care provider statement:    Critical care time (minutes):  35   Critical care time was exclusive of:  Separately billable procedures and treating other patients and teaching time   Critical care was necessary to treat or prevent imminent or life-threatening deterioration of the following conditions:  Circulatory failure and cardiac failure   Critical care was time spent personally by me on the following activities:  Blood draw for specimens, development of treatment plan with patient or surrogate, discussions with consultants, evaluation of patient's response to treatment, examination of patient, obtaining history from patient or surrogate, ordering and performing treatments and interventions, ordering and review of laboratory studies, ordering and review of radiographic studies, pulse oximetry, re-evaluation of patient's condition and review of old charts   (including critical care time)  Medications Ordered in ED Medications  propofol (DIPRIVAN) 10 mg/mL bolus/IV push 27.7 mg (27.7 mg Intravenous Not Given 02/06/19 1356)  apixaban (ELIQUIS) tablet 5 mg (has no administration in time range)  metoprolol tartrate (LOPRESSOR) injection 5 mg (5 mg Intravenous Given 02/06/19 1328)  fentaNYL (SUBLIMAZE) injection 50 mcg (50 mcg Intravenous Given by Other 02/06/19 1356)  midazolam (VERSED) injection 4 mg (2 mg Intravenous Given by Other 02/06/19 1356)     Initial Impression / Assessment and Plan / ED Course  I have reviewed the triage vital signs and the nursing notes.  Pertinent labs & imaging results that were available during my care of the patient were reviewed by me and considered in my medical decision making (see chart for details).  D/w  Dr. Domenic Polite - agrees with cardioversion if patient is willing to take the eliquis.  Also agrees that the patient needs to be on a low-dose rate control even though the patient has been somewhat resistant to taking this in the past.  This patients CHA2DS2-VASc Score and unadjusted Ischemic Stroke Rate (% per year) is equal to 3.2 % stroke rate/year from a score of 3 - for age and gender only.  Above score calculated as 1 point each if present [CHF, HTN, DM, Vascular=MI/PAD/Aortic Plaque, Age if 65-74, or Female] Above score calculated as 2 points each if present [Age > 75, or Stroke/TIA/TE]     Chest x-ray without any acute findings  Lab work pending  The pt had some hypotention due likely to tachycardia - Did not convert with small am't of lopressor Required cardioversion to prevent iminent decline, Versed and fentanyl given, propofol was not needed awake and alert and willing to take eliquis - first dose given in ED. Pt and family member aware of need for f/u.  On repeat evaluation the patient has no chest pain or shortness of breath, heart rate is approximately 60, blood pressure is approximately 123456 systolic, she continues to be in a normal sinus rhythm.  She is now awake and alert and has no more residual sedation.  She is extremely concerned about being on a rate control medication given her baseline borderline bradycardia.  I think it is reasonable at this time to place her on "pill in a pocket" beta-blocker, low-dose metoprolol as needed for any palpitations, she does not want to be on something that is going to slow her heart down chronically and feels like that was the problem with diltiazem in the past.  She can have further discussion with cardiology in the clinic.  Will send home at this time.  Troponin is slightly elevated but not surprising given the recent tachycardia as well as the cardioversion.  2nd trop at 1300+, d/w Dr. Domenic Polite - agrees weith admit for trending and cardiac  monitoring, hospitalist agreeable as well,  Final Clinical Impressions(s) / ED Diagnoses   Final diagnoses:  Atrial fibrillation with rapid ventricular response [employer]    ED Discharge Orders         Ordered    apixaban (ELIQUIS) 5 MG TABS tablet  2 times daily     02/06/19 1548    metoprolol tartrate (LOPRESSOR) 25 MG tablet  Once PRN     02/06/19 1548           Noemi Chapel, MD 02/06/19 1553    Noemi Chapel, MD 02/06/19 1624

## 2019-02-06 NOTE — ED Notes (Signed)
CRITICAL VALUE ALERT  Critical Value:  Troponin 1,397  Date & Time Notied:  02/06/19 1609  Provider Notified: Dr. Sabra Heck & Dr. Roderic Palau  Orders Received/Actions taken: EDP notified

## 2019-02-06 NOTE — Sedation Documentation (Addendum)
Synchronized cardioversion performed with 100 joules by Kendell Bane, RN. Orders given by Dr. Sabra Heck who is at bedside.

## 2019-02-06 NOTE — ED Triage Notes (Signed)
Patient states she was helping pack clothes and had sudden onset of blurry vision, felt like her "heart was racing," and nausea approximately 30 minutes ago. Patient diaphoretic at triage.

## 2019-02-06 NOTE — Progress Notes (Signed)
   02/06/19 1937  Vitals  Temp 98 F (36.7 C)  Temp Source Oral  BP (!) 141/69  MAP (mmHg) 89  BP Location Left Arm  BP Method Automatic  Patient Position (if appropriate) Sitting  Pulse Rate 70  Pulse Rate Source Monitor  Resp 18  Oxygen Therapy  SpO2 97 %  O2 Device Room Air  MEWS Score  MEWS RR 0  MEWS Pulse 0  MEWS Systolic 0  MEWS LOC 1  MEWS Temp 0  MEWS Score 1  MEWS Score Color Green    The patient  Is admitted AP 319  with the diagnosis of essential HTN. A & O x 4. Denied any acute pain at this time. The patient is oriented to her room,call bell/ascom and staff. Patient verbalized understanding to the  teaching provided by this RN.  Will continue to monitor.

## 2019-02-06 NOTE — H&P (Addendum)
TRH H&P   Patient Demographics:    Kristen Mckenzie, is a 78 y.o. female  MRN: [id number]   DOB - 1940-07-09  Admit Date - 02/06/2019  Outpatient Primary MD for the patient is Caryl Bis, MD  Referring MD/NP/PA: Dr. Sabra Heck  Outpatient Specialists: Cardiology Dr Domenic Polite  Patient coming from: Home  Chief Complaint  Patient presents with  . Blurred Vision      HPI:    Kristen Mckenzie  is a 78 y.o. female, with past medical history of epic atrial tachycardia, hypertension, she presents to ED secondary to palpitation, heart racing, initially intermittent, but before presentation it has been continuous for 30 minutes, reports dizziness, lightheadedness, near syncope, blurry vision during these episodes, she denies any chest pain, or shortness of breath, cough, fever or chills recently, patient followed by cardiology for ectopic atrial tachycardia, not on AV nodal blocking agents given low heart rate at baseline, she denies any focal deficits, tingling or numbness. - in ED patient was noted to be in A. fib with RVR, ED physician discussed with cardiology, she was started on Eliquis, had soft blood pressure, so she was cardioverted in ED, patient symptoms resolved after cardioversion, repeat troponins came back significantly elevated 30>>1397, cardiology recommend patient to be admitted overnight for observation, I was called to admit.    Review of systems:    In addition to the HPI above,  No Fever-chills, patient had lightheadedness, near syncopal episode, blurry vision and palpitation. No Headache,  No problems swallowing food or Liquids, No Chest pain, Cough or Shortness of Breath, No Abdominal pain, No Nausea or Vommitting, Bowel movements are regular, No Blood in stool or Urine, No dysuria, No new skin rashes or bruises, No new joints pains-aches,  No new weakness,  tingling, numbness in any extremity, No recent weight gain or loss, No polyuria, polydypsia or polyphagia, No significant Mental Stressors.  A full 10 point Review of Systems was done, except as stated above, all other Review of Systems were negative.   With Past History of the following :    Past Medical History:  Diagnosis Date  . Anxiety   . Arthritis   . Cholecystolithiasis   . Collagen vascular disease ([place])   . Cystocele with prolapse   . Ectopic atrial tachycardia ([place])   . Hypertension   . Rectocele       Past Surgical History:  Procedure Laterality Date  . ANTERIOR AND POSTERIOR REPAIR N/A 02/21/2018   Procedure: ANTERIOR (CYSTOCELE) REPAIR;  Surgeon: Bjorn Loser, MD;  Location: [place];  Service: Urology;  Laterality: N/A;  . CYSTOSCOPY N/A 02/21/2018   Procedure: CYSTOSCOPY;  Surgeon: Bjorn Loser, MD;  Location: [facility];  Service: Urology;  Laterality: N/A;  . EYE SURGERY Bilateral 2011   cataract extraction   . LAPAROSCOPIC VAGINAL HYSTERECTOMY WITH SALPINGO OOPHORECTOMY Bilateral 02/21/2018  Procedure: LAPAROSCOPIC ASSISTED VAGINAL HYSTERECTOMY WITH SALPINGO OOPHORECTOMY;  Surgeon: Maisie Fus, MD;  Location: [facility];  Service: Gynecology;  Laterality: Bilateral;  Dr. Matilde Sprang to follow      Social History:     Social History   Tobacco Use  . Smoking status: Never Smoker  . Smokeless tobacco: Never Used  Substance Use Topics  . Alcohol use: No    Frequency: Never       Family History :     Family History  Problem Relation Age of Onset  . Hypertension Father      Home Medications:   Prior to Admission medications   Medication Sig Start Date End Date Taking? Authorizing Provider  apixaban (ELIQUIS) 5 MG TABS tablet Take 1 tablet (5 mg total) by mouth 2 (two) times daily. 02/06/19 03/08/19  Noemi Chapel, MD  atorvastatin (LIPITOR) 40 MG tablet Take 20 mg by mouth on  Monday, Wednesday, Friday 12/21/18   Satira Sark, MD  B-COMPLEX-C PO Take 1 tablet daily by mouth.    [provider]  Calcium Carbonate-Vitamin D (CALCARB 600/D PO) Take 1 tablet by mouth daily.     [provider]  cefUROXime (CEFTIN) 250 MG tablet Take 250 mg by mouth 2 (two) times daily. 10/19/18   [provider]  cholecalciferol (VITAMIN D) 1000 units tablet Take 1,000 Units daily by mouth.    [provider]  COD LIVER OIL PO Take 1 capsule by mouth every other day.     [provider]  MAGNESIUM PO Take 1 tablet by mouth once a week.     [provider]  metoprolol tartrate (LOPRESSOR) 25 MG tablet Take 1 tablet (25 mg total) by mouth once as needed (fast heart beat / palpitations). 02/06/19 03/08/19  Noemi Chapel, MD  Multiple Vitamin (MULTIVITAMIN WITH MINERALS) TABS tablet Take 1 tablet daily by mouth.    [provider]  Multiple Vitamins-Minerals (ZINC PO) Take 1 tablet by mouth 2 (two) times a week.     [provider]  Potassium 99 MG TABS Take 1 tablet 3 (three) times a week by mouth.    [provider]  valsartan (DIOVAN) 40 MG tablet Take 0.5 tablets (20 mg total) by mouth daily. 06/25/18   Satira Sark, MD  vitamin A 10000 UNIT capsule Take 10,000 Units by mouth 2 (two) times a week.     [provider]  vitamin C (ASCORBIC ACID) 500 MG tablet Take 500 mg daily by mouth.    [provider]  vitamin E 400 UNIT capsule Take 400 Units by mouth once a week.     [provider]  zolpidem (AMBIEN) 5 MG tablet Take 5 mg by mouth as needed.    [provider]     Allergies:     Allergies  Allergen Reactions  . Phenobarbital Hives and Swelling  . Tamiflu [Oseltamivir Phosphate] Swelling and Rash  . Depakote [Valproic Acid] Hives and Itching  . Lisinopril Swelling    PT can only take low dosage     Physical Exam:   Vitals  Blood pressure (!) 100/56,  pulse (!) 55, temperature (!) 97.4 F (36.3 C), temperature source Oral, resp. rate 19, height 5\' 3"  (1.6 m), weight 55.3 kg, SpO2 100 %.   1. General developed female laying in bed in no apparent distress  2. Normal affect and insight, Not Suicidal or Homicidal, Awake Alert, Oriented X 3.  3.  No F.N deficits, ALL C.Nerves Intact, Strength 5/5 all 4 extremities, Sensation intact all 4 extremities, Plantars down going.  4. Ears and Eyes appear Normal, Conjunctivae clear, PERRLA. Moist Oral Mucosa.  5. Supple Neck, No JVD, No cervical lymphadenopathy appriciated, No Carotid Bruits.  6. Symmetrical Chest wall movement, Good air movement bilaterally, CTAB.  7. RRR, No Gallops, Rubs or Murmurs, No Parasternal Heave.  8. Positive Bowel Sounds, Abdomen Soft, No tenderness, No organomegaly appriciated,No rebound -guarding or rigidity.  9.  No Cyanosis, Normal Skin Turgor, No Skin Rash or Bruise.  10. Good muscle tone,  joints appear normal , no effusions, Normal ROM.  11. No Palpable Lymph Nodes in Neck or Axillae    Data Review:    CBC Recent Labs  Lab 02/06/19 1307  WBC 9.0  HGB 12.7  HCT 38.1  PLT 220  MCV 97.2  MCH 32.4  MCHC 33.3  RDW 13.1  LYMPHSABS 1.6  MONOABS 0.7  EOSABS 0.1  BASOSABS 0.1   ------------------------------------------------------------------------------------------------------------------  Chemistries  Recent Labs  Lab 02/06/19 1307  NA 137  K 4.1  CL 104  CO2 24  GLUCOSE 125*  BUN 19  CREATININE 0.98  CALCIUM 9.0   ------------------------------------------------------------------------------------------------------------------ estimated creatinine clearance is 38.5 mL/min (by C-G formula based on SCr of 0.98 mg/dL). ------------------------------------------------------------------------------------------------------------------ Recent Labs    02/06/19 1307  TSH 2.815    Coagulation profile No results for input(s): INR, PROTIME  in the last 168 hours. ------------------------------------------------------------------------------------------------------------------- No results for input(s): DDIMER in the last 72 hours. -------------------------------------------------------------------------------------------------------------------  Cardiac Enzymes No results for input(s): CKMB, TROPONINI, MYOGLOBIN in the last 168 hours.  Invalid input(s): CK ------------------------------------------------------------------------------------------------------------------ No results found for: BNP   ---------------------------------------------------------------------------------------------------------------  Urinalysis No results found for: COLORURINE, APPEARANCEUR, LABSPEC, PHURINE, GLUCOSEU, HGBUR, BILIRUBINUR, KETONESUR, PROTEINUR, UROBILINOGEN, NITRITE, LEUKOCYTESUR  ----------------------------------------------------------------------------------------------------------------   Imaging Results:    Dg Chest Port 1 View  Result Date: 02/06/2019 CLINICAL DATA:  Sudden onset palpitations and blurry vision. EXAM: PORTABLE CHEST 1 VIEW COMPARISON:  Chest x-ray dated February 28, 2017. FINDINGS: The heart size and mediastinal contours are within normal limits. Both lungs are clear. The visualized skeletal structures are unremarkable. IMPRESSION: No active disease. Electronically Signed   By: Titus Dubin M.D.   On: 02/06/2019 14:00    My personal review of EKG: Rhythm NSR, Rate  151 /min, QTc 465    Assessment & Plan:    Active Problems:   Essential hypertension   Atrial fibrillation with RVR (HCC)   Elevated troponin  A Fib with RVR -Quite symptomatic on presentation, she did receive cardioversion in ED, she is currently back to normal sinus rhythm. -Continue with Eliquis per cardiology recommendation. -heart rate currently controlled in the 60s, normal sinus rhythm, will defer decision of AV nodal blocking agent  to cardiology.  Addendum:  -Troponin came significantly elevated at 0000000, patient clicking comfortable, denies any chest pain, she received aspirin and statin earlier today, discussed with cardiology on call at [facility], This remains likely in the setting of demand ischemia in the setting of A. fib with RVR/hypotension/cardioversion, continue to trend troponins, and will change Eliquis to heparin GTT pending further work-up and cardiology evaluation in a.m.Marland Kitchen   Elevated troponins -Most likely demand ischemia in the setting of A. fib with RVR, and cardioversion, she denies any chest pain, repeat EKG actually with no ST abnormalities, will continue to trend and monitor overnight.  Hypertension -Blood pressure is soft initially in the setting of A. fib with  RVR, will hold her Diovan  Hyperlipidemia -Continue with statin  DVT Prophylaxis : Tarted on Eliquis  AM Labs Ordered, also please review Full Orders  Family Communication: Admission, patients condition and plan of care including tests being ordered have been discussed with the patient and daughter at [place] indicate understanding and agree with the plan and Code Status.  Code Status Full  Likely DC to  Home  Condition GUARDED    Consults called: cardiology By ED  Admission status: Observation  Time spent in minutes : 50 minutes   Phillips Climes M.D on 02/06/2019 at 4:51 PM  Between 7am to 7pm - Pager - [phone]. After 7pm go to [email or web address] - password [facility]  [facility] - Office  [phone]

## 2019-02-07 ENCOUNTER — Inpatient Hospital Stay (HOSPITAL_COMMUNITY)
Admission: EM | Disposition: A | Discharge status: Home / Self Care | Payer: Self-pay | Source: Home / Self Care | Attending: Cardiology

## 2019-02-07 DIAGNOSIS — Z888 Allergy status to other drugs, medicaments and biological substances status: Secondary | ICD-10-CM | POA: Diagnosis not present

## 2019-02-07 DIAGNOSIS — I252 Old myocardial infarction: Secondary | ICD-10-CM

## 2019-02-07 DIAGNOSIS — Z8249 Family history of ischemic heart disease and other diseases of the circulatory system: Secondary | ICD-10-CM | POA: Diagnosis not present

## 2019-02-07 DIAGNOSIS — R778 Other specified abnormalities of plasma proteins: Secondary | ICD-10-CM

## 2019-02-07 DIAGNOSIS — R7989 Other specified abnormal findings of blood chemistry: Secondary | ICD-10-CM

## 2019-02-07 DIAGNOSIS — I214 Non-ST elevation (NSTEMI) myocardial infarction: Secondary | ICD-10-CM | POA: Diagnosis not present

## 2019-02-07 DIAGNOSIS — I4891 Unspecified atrial fibrillation: Secondary | ICD-10-CM | POA: Diagnosis not present

## 2019-02-07 DIAGNOSIS — I248 Other forms of acute ischemic heart disease: Secondary | ICD-10-CM | POA: Diagnosis not present

## 2019-02-07 DIAGNOSIS — I471 Supraventricular tachycardia: Secondary | ICD-10-CM | POA: Diagnosis not present

## 2019-02-07 DIAGNOSIS — Z7901 Long term (current) use of anticoagulants: Secondary | ICD-10-CM | POA: Diagnosis not present

## 2019-02-07 DIAGNOSIS — I10 Essential (primary) hypertension: Secondary | ICD-10-CM | POA: Diagnosis not present

## 2019-02-07 DIAGNOSIS — Z79899 Other long term (current) drug therapy: Secondary | ICD-10-CM | POA: Diagnosis not present

## 2019-02-07 DIAGNOSIS — E785 Hyperlipidemia, unspecified: Secondary | ICD-10-CM | POA: Diagnosis not present

## 2019-02-07 DIAGNOSIS — M1990 Unspecified osteoarthritis, unspecified site: Secondary | ICD-10-CM | POA: Diagnosis not present

## 2019-02-07 DIAGNOSIS — Z20828 Contact with and (suspected) exposure to other viral communicable diseases: Secondary | ICD-10-CM | POA: Diagnosis not present

## 2019-02-07 HISTORY — PX: LEFT HEART CATH AND CORONARY ANGIOGRAPHY: CATH118249

## 2019-02-07 LAB — BASIC METABOLIC PANEL
Anion gap: 9 (ref 5–15)
BUN: 18 mg/dL (ref 8–23)
CO2: 22 mmol/L (ref 22–32)
Calcium: 8.5 mg/dL — ABNORMAL LOW (ref 8.9–10.3)
Chloride: 106 mmol/L (ref 98–111)
Creatinine, Ser: 0.65 mg/dL (ref 0.44–1.00)
GFR calc Af Amer: >60 mL/min (ref >60)
GFR calc non Af Amer: >60 mL/min (ref >60)
Glucose, Bld: 93 mg/dL (ref 70–99)
Potassium: 3.8 mmol/L (ref 3.5–5.1)
Sodium: 137 mmol/L (ref 135–145)

## 2019-02-07 LAB — CBC
HCT: 32.1 % — ABNORMAL LOW (ref 36.0–46.0)
Hemoglobin: 10.7 g/dL — ABNORMAL LOW (ref 12.0–15.0)
MCH: 32.5 pg (ref 26.0–34.0)
MCHC: 33.3 g/dL (ref 30.0–36.0)
MCV: 97.6 fL (ref 80.0–100.0)
Platelets: 157 10*3/uL (ref 150–400)
RBC: 3.29 MIL/uL — ABNORMAL LOW (ref 3.87–5.11)
RDW: 13.1 % (ref 11.5–15.5)
WBC: 5.7 10*3/uL (ref 4.0–10.5)
nRBC: 0 % (ref 0.0–0.2)

## 2019-02-07 LAB — TROPONIN I (HIGH SENSITIVITY): Troponin I (High Sensitivity): 7916 ng/L (ref <18)

## 2019-02-07 LAB — SARS CORONAVIRUS 2 (TAT 6-24 HRS): SARS Coronavirus 2: NEGATIVE

## 2019-02-07 SURGERY — LEFT HEART CATH AND CORONARY ANGIOGRAPHY
Anesthesia: LOCAL

## 2019-02-07 MED ORDER — SODIUM CHLORIDE 0.9 % WEIGHT BASED INFUSION
3.0000 mL/kg/h | INTRAVENOUS | Status: DC
  Administered 2019-02-07: 3 mL/kg/h via INTRAVENOUS

## 2019-02-07 MED ORDER — MIDAZOLAM HCL 2 MG/2ML IJ SOLN
INTRAMUSCULAR | Status: AC
  Filled 2019-02-07: qty 2

## 2019-02-07 MED ORDER — LIDOCAINE HCL (PF) 1 % IJ SOLN
INTRAMUSCULAR | Status: DC | PRN
  Administered 2019-02-07: 2 mL
  Administered 2019-02-07: 12 mL

## 2019-02-07 MED ORDER — SODIUM CHLORIDE 0.9 % WEIGHT BASED INFUSION
1.0000 mL/kg/h | INTRAVENOUS | Status: DC
  Administered 2019-02-07: 1 mL/kg/h via INTRAVENOUS

## 2019-02-07 MED ORDER — ASPIRIN 81 MG PO CHEW
81.0000 mg | CHEWABLE_TABLET | ORAL | Status: AC
  Administered 2019-02-07: 81 mg via ORAL
  Filled 2019-02-07: qty 1

## 2019-02-07 MED ORDER — VERAPAMIL HCL 2.5 MG/ML IV SOLN
INTRAVENOUS | Status: DC | PRN
  Administered 2019-02-07: 10 mL via INTRA_ARTERIAL

## 2019-02-07 MED ORDER — HEPARIN (PORCINE) IN NACL 1000-0.9 UT/500ML-% IV SOLN
INTRAVENOUS | Status: AC
  Filled 2019-02-07: qty 1000

## 2019-02-07 MED ORDER — IOHEXOL 350 MG/ML SOLN
INTRAVENOUS | Status: DC | PRN
  Administered 2019-02-07: 75 mL

## 2019-02-07 MED ORDER — FENTANYL CITRATE (PF) 100 MCG/2ML IJ SOLN
INTRAMUSCULAR | Status: DC | PRN
  Administered 2019-02-07: 25 ug via INTRAVENOUS

## 2019-02-07 MED ORDER — LABETALOL HCL 5 MG/ML IV SOLN: 10.0000 mg | INTRAVENOUS | Status: AC | PRN

## 2019-02-07 MED ORDER — HEPARIN (PORCINE) 25000 UT/250ML-% IV SOLN
650.0000 [IU]/h | INTRAVENOUS | Status: DC
  Administered 2019-02-07: 650 [IU]/h via INTRAVENOUS
  Filled 2019-02-07: qty 250

## 2019-02-07 MED ORDER — SODIUM CHLORIDE 0.9% FLUSH: 3.0000 mL | Freq: Two times a day (BID) | INTRAVENOUS | Status: DC

## 2019-02-07 MED ORDER — MIDAZOLAM HCL 2 MG/2ML IJ SOLN
INTRAMUSCULAR | Status: DC | PRN
  Administered 2019-02-07: 1 mg via INTRAVENOUS

## 2019-02-07 MED ORDER — ASPIRIN EC 81 MG PO TBEC
81.0000 mg | DELAYED_RELEASE_TABLET | Freq: Every day | ORAL | Status: DC
  Administered 2019-02-08: 81 mg via ORAL
  Filled 2019-02-07: qty 1

## 2019-02-07 MED ORDER — VERAPAMIL HCL 2.5 MG/ML IV SOLN
INTRAVENOUS | Status: AC
  Filled 2019-02-07: qty 2

## 2019-02-07 MED ORDER — FENTANYL CITRATE (PF) 100 MCG/2ML IJ SOLN
INTRAMUSCULAR | Status: AC
  Filled 2019-02-07: qty 2

## 2019-02-07 MED ORDER — SODIUM CHLORIDE 0.9% FLUSH
3.0000 mL | Freq: Two times a day (BID) | INTRAVENOUS | Status: DC
  Administered 2019-02-07 (×2): 3 mL via INTRAVENOUS

## 2019-02-07 MED ORDER — SODIUM CHLORIDE 0.9 % IV SOLN: INTRAVENOUS | Status: AC

## 2019-02-07 MED ORDER — SODIUM CHLORIDE 0.9% FLUSH: 3.0000 mL | INTRAVENOUS | Status: DC | PRN

## 2019-02-07 MED ORDER — HEPARIN SODIUM (PORCINE) 1000 UNIT/ML IJ SOLN
INTRAMUSCULAR | Status: AC
  Filled 2019-02-07: qty 1

## 2019-02-07 MED ORDER — SODIUM CHLORIDE 0.9 % IV SOLN: 250.0000 mL | INTRAVENOUS | Status: DC | PRN

## 2019-02-07 MED ORDER — HYDRALAZINE HCL 20 MG/ML IJ SOLN: 10.0000 mg | INTRAMUSCULAR | Status: AC | PRN

## 2019-02-07 MED ORDER — HEPARIN (PORCINE) IN NACL 1000-0.9 UT/500ML-% IV SOLN
INTRAVENOUS | Status: DC | PRN
  Administered 2019-02-07 (×2): 500 mL

## 2019-02-07 MED ORDER — LIDOCAINE HCL (PF) 1 % IJ SOLN
INTRAMUSCULAR | Status: AC
  Filled 2019-02-07: qty 30

## 2019-02-07 MED ORDER — HEPARIN (PORCINE) 25000 UT/250ML-% IV SOLN
700.0000 [IU]/h | INTRAVENOUS | Status: DC
  Administered 2019-02-07: 700 [IU]/h via INTRAVENOUS
  Filled 2019-02-07 (×2): qty 250

## 2019-02-07 SURGICAL SUPPLY — 15 items
CATH 5FR JL3.5 JR4 ANG PIG MP (CATHETERS) ×1 IMPLANT
CLOSURE MYNX CONTROL 5F (Vascular Products) ×1 IMPLANT
DEVICE RAD COMP TR BAND LRG (VASCULAR PRODUCTS) ×1 IMPLANT
GLIDESHEATH SLEND SS 6F .021 (SHEATH) ×1 IMPLANT
GUIDEWIRE INQWIRE 1.5J.035X260 (WIRE) IMPLANT
INQWIRE 1.5J .035X260CM (WIRE) ×2
KIT HEART LEFT (KITS) ×2 IMPLANT
KIT MICROPUNCTURE NIT STIFF (SHEATH) ×1 IMPLANT
PACK CARDIAC CATHETERIZATION (CUSTOM PROCEDURE TRAY) ×2 IMPLANT
SHEATH PINNACLE 5F 10CM (SHEATH) ×1 IMPLANT
SYR MEDRAD MARK 7 150ML (SYRINGE) ×2 IMPLANT
TRANSDUCER W/STOPCOCK (MISCELLANEOUS) ×2 IMPLANT
TUBING CIL FLEX 10 FLL-RA (TUBING) ×2 IMPLANT
WIRE EMERALD 3MM-J .035X150CM (WIRE) ×1 IMPLANT
WIRE HI TORQ VERSACORE-J 145CM (WIRE) ×1 IMPLANT

## 2019-02-07 NOTE — Progress Notes (Signed)
Report given to Stevan Born, RN from [place] and all questions answered. Report called to Valda Lamb, RN at [facility] lab and all questions answered.  Patient transported to [facility] in no acute distress. VSS.

## 2019-02-07 NOTE — H&P (View-Only) (Signed)
Cardiology Consultation:   Patient ID: Kristen Mckenzie; [id number]; 02-06-1941   Admit date: 02/06/2019 Date of Consult: 02/07/2019  Primary Care Provider: Caryl Bis, MD Primary Cardiologist: Rozann Lesches, MD Primary Electrophysiologist: None   Patient Profile:   Kristen Mckenzie is a 78 y.o. female with a history of hypertension and ectopic atrial tachycardia who is being seen today for the evaluation of recently documented rapid atrial fibrillation and abnormal cardiac enzymes at the request of Dr. Dyann Kief.  History of Present Illness:   Kristen Mckenzie is a patient of mine with a history of previously documented ectopic atrial tachycardia and hypertension.  She typically reports very brief palpitations, was not able to tolerate Cardizem CD in the past secondary to fatigue and has not been on any AV nodal blockers of late.  She presented to the ER yesterday describing the sudden onset of rapid palpitations and eventually a feeling of lightheadedness, no chest pain reported.  She was found to be in rapid atrial fibrillation on evaluation by Dr. Sabra Heck, heart rates in the 150s to 170s intermittently.  Initial plan was cardioversion in the ER and placement on Eliquis for stroke prophylaxis with CHADSVASC score of 2 as well as some form of AV nodal blocker, perhaps to be used initially on an as-needed basis with further discussion about potential antiarrhythmic therapy as an outpatient.  Initial high-sensitivity troponin I level was found to be 30 in the ER, checked following her cardioversion at 1397.  She was admitted to the hospital for observation to get a better sense of trend, and unfortunately high-sensitivity troponin I level continued to climb most recently up to 7916.  She has no documented history of ischemic heart disease, does not report any obvious angina symptoms.  She underwent a Myoview back in 2018 that was low risk.  She received a single dose of Eliquis in the ER,  is currently on IV heparin and does not report any active chest pain.  ECG post cardioversion showed sinus rhythm with PVC and no acute ST segment changes.  Past Medical History:  Diagnosis Date  . Anxiety   . Arthritis   . Cholecystolithiasis   . Collagen vascular disease ([place])   . Cystocele with prolapse   . Ectopic atrial tachycardia ([place])   . Hypertension   . Rectocele     Past Surgical History:  Procedure Laterality Date  . ANTERIOR AND POSTERIOR REPAIR N/A 02/21/2018   Procedure: ANTERIOR (CYSTOCELE) REPAIR;  Surgeon: Bjorn Loser, MD;  Location: [place];  Service: Urology;  Laterality: N/A;  . CYSTOSCOPY N/A 02/21/2018   Procedure: CYSTOSCOPY;  Surgeon: Bjorn Loser, MD;  Location[facility];  Service: Urology;  Laterality: N/A;  . EYE SURGERY Bilateral 2011   cataract extraction   . LAPAROSCOPIC VAGINAL HYSTERECTOMY WITH SALPINGO OOPHORECTOMY Bilateral 02/21/2018   Procedure: LAPAROSCOPIC ASSISTED VAGINAL HYSTERECTOMY WITH SALPINGO OOPHORECTOMY;  Surgeon: Maisie Fus, MD;  Location: [facility];  Service: Gynecology;  Laterality: Bilateral;  Dr. Matilde Sprang to follow     Inpatient Medications: Scheduled Meds: . atorvastatin  20 mg Oral q1800   Continuous Infusions: . heparin 650 Units/hr (02/07/19 0452)   PRN Meds: zolpidem  Allergies:    Allergies  Allergen Reactions  . Phenobarbital Hives and Swelling  . Tamiflu [Oseltamivir Phosphate] Swelling and Rash  . Depakote [Valproic Acid] Hives and Itching  . Lisinopril Swelling    PT can only take low dosage    Social  History:   Social History   Socioeconomic History  . Marital status: Widowed    Spouse name: Not on file  . Number of children: Not on file  . Years of education: Not on file  . Highest education level: Not on file  Occupational History  . Occupation: retired  Scientific laboratory technician  . Financial resource strain: Not hard at all  . Food  insecurity    Worry: Never true    Inability: Never true  . Transportation needs    Medical: No    Non-medical: No  Tobacco Use  . Smoking status: Never Smoker  . Smokeless tobacco: Never Used  Substance and Sexual Activity  . Alcohol use: No    Frequency: Never  . Drug use: No  . Sexual activity: Not on file  Lifestyle  . Physical activity    Days per week: 7 days    Minutes per session: 30 min  . Stress: Not at all  Relationships  . Social connections    Talks on phone: More than three times a week    Gets together: More than three times a week    Attends religious service: More than 4 times per year    Active member of club or organization: Yes    Attends meetings of clubs or organizations: More than 4 times per year    Relationship status: Widowed  . Intimate partner violence    Fear of current or ex partner: No    Emotionally abused: No    Physically abused: No    Forced sexual activity: No  Other Topics Concern  . Not on file  Social History Narrative  . Not on file    Family History:   [name] family history includes Hypertension in her father.  ROS:  Please see the history of present illness.  All other ROS reviewed and negative.     Physical Exam/Data:   Vitals:   02/06/19 1937 02/06/19 2036 02/07/19 0456 02/07/19 0800  BP: (!) 141/69  126/60 140/74  Pulse: 70  66 62  Resp: 18  16 14   Temp: 98 F (36.7 C)  97.8 F (36.6 C) 98.3 F (36.8 C)  TempSrc: Oral   Oral  SpO2: 97% 97% 97% 98%  Weight:      Height:        Intake/Output Summary (Last 24 hours) at 02/07/2019 0949 Last data filed at 02/07/2019 0655 Gross per 24 hour  Intake 13.23 ml  Output -  Net 13.23 ml   Filed Weights   02/06/19 1300  Weight: 55.3 kg   Body mass index is 21.61 kg/m.   Gen: Elderly woman, appears comfortable at rest. HEENT: Conjunctiva and lids normal, oropharynx clear. Neck: Supple, no elevated JVP or carotid bruits, no thyromegaly. Lungs: Clear to  auscultation, nonlabored breathing at rest. Cardiac: Regular rate and rhythm, no S3, soft systolic murmur, no pericardial rub. Abdomen: Soft, nontender, bowel sounds present, no guarding or rebound. Extremities: No pitting edema, distal pulses 2+. Skin: Warm and dry. Musculoskeletal: No kyphosis. Neuropsychiatric: Alert and oriented x3, affect grossly appropriate.  EKG:  An ECG dated 02/06/2019 was personally reviewed today and demonstrated:  Sinus rhythm with PVC.  Telemetry:  I personally reviewed telemetry which shows sinus rhythm.  Relevant CV Studies:  Echocardiogram 01/16/2019:  1. Left ventricular ejection fraction, by visual estimation, is 70 to 75%. The left ventricle has hyperdynamic function. Normal left ventricular posterior wall thickness. There is no left ventricular hypertrophy.  2. Elevated mean left atrial pressure.  3. Left ventricular diastolic Doppler parameters are consistent with impaired relaxation pattern of LV diastolic filling.  4. There is focal basal septal LV hypertrophy. In the setting of focal basal septal hypertrophy and hyperdynamic LV function there is SAM of the anterior mitral valve leaflet and a dynamic LVOT gradient with a peak of 32 mmHg.  5. Global right ventricle has normal systolic function.The right ventricular size is normal. No increase in right ventricular wall thickness.  6. Left atrial size was mildly dilated.  7. Right atrial size was normal.  8. Moderate mitral annular calcification.  9. The mitral valve is abnormal. Mild mitral valve regurgitation. No evidence of mitral stenosis. 10. The tricuspid valve is normal in structure. Tricuspid valve regurgitation is mild. 11. The aortic valve is tricuspid Aortic valve regurgitation was not visualized by color flow Doppler. Structurally normal aortic valve, with no evidence of sclerosis or stenosis. 12. The pulmonic valve was not well visualized. Pulmonic valve regurgitation is not visualized by  color flow Doppler. 13. Mildly elevated pulmonary artery systolic pressure. 14. PASP is borderline elevated at 30 mmHg.  Laboratory Data:  Chemistry Recent Labs  Lab 02/06/19 1307 02/07/19 0433  NA 137 137  K 4.1 3.8  CL 104 106  CO2 24 22  GLUCOSE 125* 93  BUN 19 18  CREATININE 0.98 0.65  CALCIUM 9.0 8.5*  GFRNONAA 55* >60  GFRAA >60 >60  ANIONGAP 9 9    No results for input(s): PROT, ALBUMIN, AST, ALT, ALKPHOS, BILITOT in the last 168 hours. Hematology Recent Labs  Lab 02/06/19 1307 02/07/19 0433  WBC 9.0 5.7  RBC 3.92 3.29*  HGB 12.7 10.7*  HCT 38.1 32.1*  MCV 97.2 97.6  MCH 32.4 32.5  MCHC 33.3 33.3  RDW 13.1 13.1  PLT 220 157   Cardiac Enzymes Recent Labs  Lab 02/06/19 1307 02/06/19 1518 02/06/19 1901 02/07/19 0433  TROPONINIHS 30* 1,397* 6,543* 7,916*    Radiology/Studies:  Dg Chest Port 1 View  Result Date: 02/06/2019 CLINICAL DATA:  Sudden onset palpitations and blurry vision. EXAM: PORTABLE CHEST 1 VIEW COMPARISON:  Chest x-ray dated February 28, 2017. FINDINGS: The heart size and mediastinal contours are within normal limits. Both lungs are clear. The visualized skeletal structures are unremarkable. IMPRESSION: No active disease. Electronically Signed   By: Titus Dubin M.D.   On: 02/06/2019 14:00    Assessment and Plan:   1.  Newly documented rapid atrial fibrillation, now in sinus rhythm following electrical cardioversion in the ER yesterday.  CHADSVASC score is 2.  2.  History of ectopic atrial tachycardia, has not been on AV nodal blockers as an outpatient given intolerance.  3.  Abnormal high-sensitivity troponin I levels, increasing to 7916.  Although certainly demand ischemia with RVR and recent cardioversion could be contributory, these levels are somewhat higher than would be expected and therefore further ischemic work-up is being pursued at this point.  She did have a Myoview back in 2018 that was low risk.  No obvious angina  symptoms reported recently.  4.  Essential hypertension by history.  She has been on Diovan as an outpatient.  I discussed the situation with the patient and also her daughter Suanne Marker by phone.  Recommendation at this time is to proceed with a diagnostic cardiac catheterization to clearly define coronary anatomy and assess whether any revascularization options need to be considered, particularly prior to initiating oral anticoagulation.  She has been on IV  heparin (only got 1 dose of Eliquis yesterday so this should not have achieved steady state).  After reviewing the risks and benefits, she is in agreement to proceed, we will arrange transfer to [facility]  Once ischemic work-up is complete and Eliquis can be initiated, further decision will need to be made regarding use of either and as needed AV nodal blocker for palpitations, or perhaps initiating antiarrhythmic therapy for rhythm suppression.  Signed, Rozann Lesches, MD  02/07/2019 9:49 AM

## 2019-02-07 NOTE — Progress Notes (Signed)
ANTICOAGULATION CONSULT NOTE - Initial Consult  Pharmacy Consult for apixaban >> IV heparin  Indication: chest pain/ACS  Allergies  Allergen Reactions  . Phenobarbital Hives and Swelling  . Tamiflu [Oseltamivir Phosphate] Swelling and Rash  . Depakote [Valproic Acid] Hives and Itching  . Lisinopril Swelling    PT can only take low dosage    Patient Measurements: Height: 5\' 3"  (160 cm) Weight: 122 lb (55.3 kg) IBW/kg (Calculated) : 52.4 Heparin Dosing Weight:   Vital Signs: Temp: 98 F (36.7 C) (10/14 1937) Temp Source: Oral (10/14 1937) BP: 141/69 (10/14 1937) Pulse Rate: 70 (10/14 1937)  Labs: Recent Labs    02/06/19 1307 02/06/19 1518 02/06/19 1901  HGB 12.7  --   --   HCT 38.1  --   --   PLT 220  --   --   CREATININE 0.98  --   --   TROPONINIHS 30* 1,397* 6,543*    Estimated Creatinine Clearance: 38.5 mL/min (by C-G formula based on SCr of 0.98 mg/dL).   Medical History: Past Medical History:  Diagnosis Date  . Anxiety   . Arthritis   . Cholecystolithiasis   . Collagen vascular disease ([place])   . Cystocele with prolapse   . Ectopic atrial tachycardia ([place])   . Hypertension   . Rectocele     Medications:  Scheduled:  . atorvastatin  20 mg Oral q1800    Assessment: Pharmacy is consulted to dose heparin drip in 78 yo female with rising troponins.  Pt was started on Eliquis after cardioversion and received a dose on 10/15 at 1634.   Hgb, plt WNL.  Due to recent administration of Eliquis, will initially monitor heparin using aPTT until HL and aPTT start to correlate.    Goal of Therapy:  Heparin level 0.3-0.7 units/ml Monitor platelets by anticoagulation protocol: Yes   Plan:   Starting 12 hours after Eliquis dose, start heparin 650 units/hr. No bolus due to recent Eliquis dose  Obtain aPTT/HL  8 hours after start of infusion   Daily CBC while on heparin  Monitor for signs and symptoms of bleeding    Royetta Asal, PharmD,  BCPS 02/07/2019 1:25 AM

## 2019-02-07 NOTE — Consult Note (Signed)
Cardiology Consultation:   Patient ID: Kristen Mckenzie; [id number]; [birth year]   Admit date: 02/06/2019 Date of Consult: 02/07/2019  Primary Care Provider: Caryl Bis, MD Primary Cardiologist: Rozann Lesches, MD Primary Electrophysiologist: None   Patient Profile:   Kristen Mckenzie is a 78 y.o. female with a history of hypertension and ectopic atrial tachycardia who is being seen today for the evaluation of recently documented rapid atrial fibrillation and abnormal cardiac enzymes at the request of Dr. Dyann Kief.  History of Present Illness:   Kristen Mckenzie is a patient of mine with a history of previously documented ectopic atrial tachycardia and hypertension.  She typically reports very brief palpitations, was not able to tolerate Cardizem CD in the past secondary to fatigue and has not been on any AV nodal blockers of late.  She presented to the ER yesterday describing the sudden onset of rapid palpitations and eventually a feeling of lightheadedness, no chest pain reported.  She was found to be in rapid atrial fibrillation on evaluation by Dr. Sabra Heck, heart rates in the 150s to 170s intermittently.  Initial plan was cardioversion in the ER and placement on Eliquis for stroke prophylaxis with CHADSVASC score of 2 as well as some form of AV nodal blocker, perhaps to be used initially on an as-needed basis with further discussion about potential antiarrhythmic therapy as an outpatient.  Initial high-sensitivity troponin I level was found to be 30 in the ER, checked following her cardioversion at 1397.  She was admitted to the hospital for observation to get a better sense of trend, and unfortunately high-sensitivity troponin I level continued to climb most recently up to 7916.  She has no documented history of ischemic heart disease, does not report any obvious angina symptoms.  She underwent a Myoview back in 2018 that was low risk.  She received a single dose of Eliquis in the ER,  is currently on IV heparin and does not report any active chest pain.  ECG post cardioversion showed sinus rhythm with PVC and no acute ST segment changes.  Past Medical History:  Diagnosis Date  . Anxiety   . Arthritis   . Cholecystolithiasis   . Collagen vascular disease ([place])   . Cystocele with prolapse   . Ectopic atrial tachycardia ([place])   . Hypertension   . Rectocele     Past Surgical History:  Procedure Laterality Date  . ANTERIOR AND POSTERIOR REPAIR N/A 02/21/2018   Procedure: ANTERIOR (CYSTOCELE) REPAIR;  Surgeon: Bjorn Loser, MD;  Location: [facility];  Service: Urology;  Laterality: N/A;  . CYSTOSCOPY N/A 02/21/2018   Procedure: CYSTOSCOPY;  Surgeon: Bjorn Loser, MD;  Location: [facility];  Service: Urology;  Laterality: N/A;  . EYE SURGERY Bilateral 2011   cataract extraction   . LAPAROSCOPIC VAGINAL HYSTERECTOMY WITH SALPINGO OOPHORECTOMY Bilateral 02/21/2018   Procedure: LAPAROSCOPIC ASSISTED VAGINAL HYSTERECTOMY WITH SALPINGO OOPHORECTOMY;  Surgeon: Maisie Fus, MD;  Location: [facility];  Service: Gynecology;  Laterality: Bilateral;  Dr. Matilde Sprang to follow     Inpatient Medications: Scheduled Meds: . atorvastatin  20 mg Oral q1800   Continuous Infusions: . heparin 650 Units/hr (02/07/19 0452)   PRN Meds: zolpidem  Allergies:    Allergies  Allergen Reactions  . Phenobarbital Hives and Swelling  . Tamiflu [Oseltamivir Phosphate] Swelling and Rash  . Depakote [Valproic Acid] Hives and Itching  . Lisinopril Swelling    PT can only take low dosage    Social  History:   Social History   Socioeconomic History  . Marital status: Widowed    Spouse name: Not on file  . Number of children: Not on file  . Years of education: Not on file  . Highest education level: Not on file  Occupational History  . Occupation: retired  Scientific laboratory technician  . Financial resource strain: Not hard at all  . Food  insecurity    Worry: Never true    Inability: Never true  . Transportation needs    Medical: No    Non-medical: No  Tobacco Use  . Smoking status: Never Smoker  . Smokeless tobacco: Never Used  Substance and Sexual Activity  . Alcohol use: No    Frequency: Never  . Drug use: No  . Sexual activity: Not on file  Lifestyle  . Physical activity    Days per week: 7 days    Minutes per session: 30 min  . Stress: Not at all  Relationships  . Social connections    Talks on phone: More than three times a week    Gets together: More than three times a week    Attends religious service: More than 4 times per year    Active member of club or organization: Yes    Attends meetings of clubs or organizations: More than 4 times per year    Relationship status: Widowed  . Intimate partner violence    Fear of current or ex partner: No    Emotionally abused: No    Physically abused: No    Forced sexual activity: No  Other Topics Concern  . Not on file  Social History Narrative  . Not on file    Family History:   [name] family history includes Hypertension in her father.  ROS:  Please see the history of present illness.  All other ROS reviewed and negative.     Physical Exam/Data:   Vitals:   02/06/19 1937 02/06/19 2036 02/07/19 0456 02/07/19 0800  BP: (!) 141/69  126/60 140/74  Pulse: 70  66 62  Resp: 18  16 14   Temp: 98 F (36.7 C)  97.8 F (36.6 C) 98.3 F (36.8 C)  TempSrc: Oral   Oral  SpO2: 97% 97% 97% 98%  Weight:      Height:        Intake/Output Summary (Last 24 hours) at 02/07/2019 0949 Last data filed at 02/07/2019 0655 Gross per 24 hour  Intake 13.23 ml  Output -  Net 13.23 ml   Filed Weights   02/06/19 1300  Weight: 55.3 kg   Body mass index is 21.61 kg/m.   Gen: Elderly woman, appears comfortable at rest. HEENT: Conjunctiva and lids normal, oropharynx clear. Neck: Supple, no elevated JVP or carotid bruits, no thyromegaly. Lungs: Clear to  auscultation, nonlabored breathing at rest. Cardiac: Regular rate and rhythm, no S3, soft systolic murmur, no pericardial rub. Abdomen: Soft, nontender, bowel sounds present, no guarding or rebound. Extremities: No pitting edema, distal pulses 2+. Skin: Warm and dry. Musculoskeletal: No kyphosis. Neuropsychiatric: Alert and oriented x3, affect grossly appropriate.  EKG:  An ECG dated 02/06/2019 was personally reviewed today and demonstrated:  Sinus rhythm with PVC.  Telemetry:  I personally reviewed telemetry which shows sinus rhythm.  Relevant CV Studies:  Echocardiogram 01/16/2019:  1. Left ventricular ejection fraction, by visual estimation, is 70 to 75%. The left ventricle has hyperdynamic function. Normal left ventricular posterior wall thickness. There is no left ventricular hypertrophy.  2. Elevated mean left atrial pressure.  3. Left ventricular diastolic Doppler parameters are consistent with impaired relaxation pattern of LV diastolic filling.  4. There is focal basal septal LV hypertrophy. In the setting of focal basal septal hypertrophy and hyperdynamic LV function there is SAM of the anterior mitral valve leaflet and a dynamic LVOT gradient with a peak of 32 mmHg.  5. Global right ventricle has normal systolic function.The right ventricular size is normal. No increase in right ventricular wall thickness.  6. Left atrial size was mildly dilated.  7. Right atrial size was normal.  8. Moderate mitral annular calcification.  9. The mitral valve is abnormal. Mild mitral valve regurgitation. No evidence of mitral stenosis. 10. The tricuspid valve is normal in structure. Tricuspid valve regurgitation is mild. 11. The aortic valve is tricuspid Aortic valve regurgitation was not visualized by color flow Doppler. Structurally normal aortic valve, with no evidence of sclerosis or stenosis. 12. The pulmonic valve was not well visualized. Pulmonic valve regurgitation is not visualized by  color flow Doppler. 13. Mildly elevated pulmonary artery systolic pressure. 14. PASP is borderline elevated at 30 mmHg.  Laboratory Data:  Chemistry Recent Labs  Lab 02/06/19 1307 02/07/19 0433  NA 137 137  K 4.1 3.8  CL 104 106  CO2 24 22  GLUCOSE 125* 93  BUN 19 18  CREATININE 0.98 0.65  CALCIUM 9.0 8.5*  GFRNONAA 55* >60  GFRAA >60 >60  ANIONGAP 9 9    No results for input(s): PROT, ALBUMIN, AST, ALT, ALKPHOS, BILITOT in the last 168 hours. Hematology Recent Labs  Lab 02/06/19 1307 02/07/19 0433  WBC 9.0 5.7  RBC 3.92 3.29*  HGB 12.7 10.7*  HCT 38.1 32.1*  MCV 97.2 97.6  MCH 32.4 32.5  MCHC 33.3 33.3  RDW 13.1 13.1  PLT 220 157   Cardiac Enzymes Recent Labs  Lab 02/06/19 1307 02/06/19 1518 02/06/19 1901 02/07/19 0433  TROPONINIHS 30* 1,397* 6,543* 7,916*    Radiology/Studies:  Dg Chest Port 1 View  Result Date: 02/06/2019 CLINICAL DATA:  Sudden onset palpitations and blurry vision. EXAM: PORTABLE CHEST 1 VIEW COMPARISON:  Chest x-ray dated February 28, 2017. FINDINGS: The heart size and mediastinal contours are within normal limits. Both lungs are clear. The visualized skeletal structures are unremarkable. IMPRESSION: No active disease. Electronically Signed   By: Titus Dubin M.D.   On: 02/06/2019 14:00    Assessment and Plan:   1.  Newly documented rapid atrial fibrillation, now in sinus rhythm following electrical cardioversion in the ER yesterday.  CHADSVASC score is 2.  2.  History of ectopic atrial tachycardia, has not been on AV nodal blockers as an outpatient given intolerance.  3.  Abnormal high-sensitivity troponin I levels, increasing to 7916.  Although certainly demand ischemia with RVR and recent cardioversion could be contributory, these levels are somewhat higher than would be expected and therefore further ischemic work-up is being pursued at this point.  She did have a Myoview back in 2018 that was low risk.  No obvious angina  symptoms reported recently.  4.  Essential hypertension by history.  She has been on Diovan as an outpatient.  I discussed the situation with the patient and also her daughter Suanne Marker by phone.  Recommendation at this time is to proceed with a diagnostic cardiac catheterization to clearly define coronary anatomy and assess whether any revascularization options need to be considered, particularly prior to initiating oral anticoagulation.  She has been on IV  heparin (only got 1 dose of Eliquis yesterday so this should not have achieved steady state).  After reviewing the risks and benefits, she is in agreement to proceed, we will arrange transfer to [facility].  Once ischemic work-up is complete and Eliquis can be initiated, further decision will need to be made regarding use of either and as needed AV nodal blocker for palpitations, or perhaps initiating antiarrhythmic therapy for rhythm suppression.  Signed, Rozann Lesches, MD  02/07/2019 9:49 AM

## 2019-02-07 NOTE — Progress Notes (Signed)
Troponin level is 7916, Patient denies any CP or SOB. Heparin drip started at 0452 at 6.5 mL/hr. No bleeding/hematoma or any new bruising noted. Paged Dr. Darrick Meigs to notified and awaiting a new direction. Will continue to monitor

## 2019-02-07 NOTE — Progress Notes (Signed)
PROGRESS NOTE    Kristen Mckenzie  [id number] DOB: [birth year] DOA: 02/06/2019 PCP: Caryl Bis, MD     Brief Narrative:  78 y.o. female, with past medical history of epic atrial tachycardia, hypertension, she presents to ED secondary to palpitation, heart racing, initially intermittent, but before presentation it has been continuous for 30 minutes, reports dizziness, lightheadedness, near syncope, blurry vision during these episodes, she denies any chest pain, or shortness of breath, cough, fever or chills recently, patient followed by cardiology for ectopic atrial tachycardia, not on AV nodal blocking agents given low heart rate at baseline, she denies any focal deficits, tingling or numbness. - in ED patient was noted to be in A. fib with RVR, ED physician discussed with cardiology, she was started on Eliquis, had soft blood pressure, so she was cardioverted in ED, patient symptoms resolved after cardioversion, repeat troponins came back significantly elevated 30>>1397>>70,000, cardiology recommend patient to be admitted.  Assessment & Plan: 1-A. fib with RVR -Prior history of atrial tachycardia -Status post cardioversion on 02/06/2019 -Currently sinus rhythm and normal rate -Further decisions about the need of AV nodal blockers or antiarrhythmic medications to be decided by cardiology service -If needed while at [facility] Can be evaluated by EP service. -Continue heparin drip for now in anticipation for cardiac catheterization admitted on given CHADSVASC  Score of 2 can be started on eliquis. -Continue telemetry monitoring.  2-Essential hypertension -Soft but stable. -Continue to follow vital signs. -Heart healthy diet has been encouraged.  3-NSTEMI -Continue heparin drip -Patient will be transferred to [facility] for cardiac catheterization evaluation. -Care will be transferred to cardiology service for further evaluation and management.   DVT prophylaxis:  Heparin drip. Code Status: Full code Family Communication: No family at bedside. Disposition Plan: Patient will be transferred to [facility] for cardiac hospitalization and her care transition to cardiology's service for further evaluation and management.  She denies any chest pain or shortness of breath at this moment; she is hemodynamically stable, in normal rate and sinus rhythm currently.  Consultants:   Cardiology service  Procedures:   See below for x-ray reports  Cardioversion 02/06/2019  Planned cardiac cath 02/07/2019  Antimicrobials:  Anti-infectives (From admission, onward)   None      Subjective: Afebrile, no chest pain, no nausea, no vomiting.  Patient reports no palpitations.  Significant elevation of troponin throughout the night.  Objective: Vitals:   02/06/19 1937 02/06/19 2036 02/07/19 0456 02/07/19 0800  BP: (!) 141/69  126/60 140/74  Pulse: 70  66 62  Resp: 18  16 14   Temp: 98 F (36.7 C)  97.8 F (36.6 C) 98.3 F (36.8 C)  TempSrc: Oral   Oral  SpO2: 97% 97% 97% 98%  Weight:      Height:        Intake/Output Summary (Last 24 hours) at 02/07/2019 1039 Last data filed at 02/07/2019 0655 Gross per 24 hour  Intake 13.23 ml  Output -  Net 13.23 ml   Filed Weights   02/06/19 1300  Weight: 55.3 kg    Examination:  General exam: Alert, awake, oriented x 3; denies chest pain, no palpitations, no nausea, no vomiting.  Patient expressed some heaviness feeling with attached pacer pads on her chest. Respiratory system: Clear to auscultation. Respiratory effort normal.  Good oxygen saturation on room air. Cardiovascular system:Rate controlled and currently sinus rhythm; No murmurs, rubs or gallops. Gastrointestinal system: Abdomen is nondistended, soft and nontender. No organomegaly  or masses felt. Normal bowel sounds heard. Central nervous system: Alert and oriented. No focal neurological deficits. Extremities: No C/C/E, +pedal pulses  Skin: No rashes, lesions or ulcers Psychiatry: Judgement and insight appear normal. Mood & affect appropriate.     Data Reviewed: I have personally reviewed following labs and imaging studies  CBC: Recent Labs  Lab 02/06/19 1307 02/07/19 0433  WBC 9.0 5.7  NEUTROABS 6.6  --   HGB 12.7 10.7*  HCT 38.1 32.1*  MCV 97.2 97.6  PLT 220 [id number]   Basic Metabolic Panel: Recent Labs  Lab 02/06/19 1307 02/07/19 0433  NA 137 137  K 4.1 3.8  CL 104 106  CO2 24 22  GLUCOSE 125* 93  BUN 19 18  CREATININE 0.98 0.65  CALCIUM 9.0 8.5*   GFR: Estimated Creatinine Clearance: 47.2 mL/min (by C-G formula based on SCr of 0.65 mg/dL).  Thyroid Function Tests: Recent Labs    02/06/19 1307  TSH 2.815    Recent Results (from the past 240 hour(s))  SARS CORONAVIRUS 2 (TAT 6-24 HRS) Nasopharyngeal Nasopharyngeal Swab     Status: None   Collection Time: 02/06/19  4:24 PM   Specimen: Nasopharyngeal Swab  Result Value Ref Range Status   SARS Coronavirus 2 NEGATIVE NEGATIVE Final    Comment: (NOTE) SARS-CoV-2 target nucleic acids are NOT DETECTED. The SARS-CoV-2 RNA is generally detectable in upper and lower respiratory specimens during the acute phase of infection. Negative results do not preclude SARS-CoV-2 infection, do not rule out co-infections with other pathogens, and should not be used as the sole basis for treatment or other patient management decisions. Negative results must be combined with clinical observations, patient history, and epidemiological information. The expected result is Negative. Fact Sheet for Patients: SugarRoll.be Fact Sheet for Healthcare Providers: https://www.woods-mathews.com/ This test is not yet approved or cleared by the [place] FDA and  has been authorized for detection and/or diagnosis of SARS-CoV-2 by FDA under an Emergency Use Authorization (EUA). This EUA will remain  in effect (meaning this test can  be used) for the duration of the COVID-19 declaration under Section 56 4(b)(1) of the Act, 21 U.S.C. section 360bbb-3(b)(1), unless the authorization is terminated or revoked sooner. Performed at [facility], [place] [address], [place], [place] [address]      Radiology Studies: Dg Chest Port 1 View  Result Date: 02/06/2019 CLINICAL DATA:  Sudden onset palpitations and blurry vision. EXAM: PORTABLE CHEST 1 VIEW COMPARISON:  Chest x-ray dated February 28, 2017. FINDINGS: The heart size and mediastinal contours are within normal limits. Both lungs are clear. The visualized skeletal structures are unremarkable. IMPRESSION: No active disease. Electronically Signed   By: Titus Dubin M.D.   On: 02/06/2019 14:00    Scheduled Meds: . aspirin  81 mg Oral Pre-Cath  . aspirin EC  81 mg Oral Daily  . atorvastatin  20 mg Oral q1800  . sodium chloride flush  3 mL Intravenous Q12H   Continuous Infusions: . sodium chloride     Followed by  . sodium chloride    . heparin 650 Units/hr (02/07/19 0452)     LOS: 0 days    Time spent: 30 minutes.     Barton Dubois, MD [facility] Pager [phone]   02/07/2019, 10:39 AM

## 2019-02-07 NOTE — Interval H&P Note (Signed)
History and Physical Interval Note:  02/07/2019 1:58 PM  Kristen Mckenzie  has presented today for cardiac cath with the diagnosis of nonstemi.  The various methods of treatment have been discussed with the patient and family. After consideration of risks, benefits and other options for treatment, the patient has consented to  Procedure(s): LEFT HEART CATH AND CORONARY ANGIOGRAPHY (N/A) as a surgical intervention.  [name] history has been reviewed, patient examined, no change in status, stable for surgery.  I have reviewed the patient's chart and labs.  Questions were answered to the patient's satisfaction.    Cath Lab Visit (complete for each Cath Lab visit)  Clinical Evaluation Leading to the Procedure:   ACS: Yes.    Non-ACS:    Anginal Classification: CCS II  Anti-ischemic medical therapy: No Therapy  Non-Invasive Test Results: No non-invasive testing performed  Prior CABG: No previous CABG         Lauree Chandler

## 2019-02-07 NOTE — Progress Notes (Signed)
[place] for apixaban >> IV heparin  Indication: chest pain/ACS  Allergies  Allergen Reactions  . Phenobarbital Hives and Swelling  . Tamiflu [Oseltamivir Phosphate] Swelling and Rash  . Depakote [Valproic Acid] Hives and Itching  . Lisinopril Swelling    PT can only take low dosage    Patient Measurements: Height: 5\' 3"  (160 cm) Weight: 122 lb (55.3 kg) IBW/kg (Calculated) : 52.4 Heparin Dosing Weight:   Vital Signs: Temp: 98.3 F (36.8 C) (10/15 0800) Temp Source: Oral (10/15 0800) BP: 140/74 (10/15 0800) Pulse Rate: 62 (10/15 0800)  Labs: Recent Labs    02/06/19 1307 02/06/19 1518 02/06/19 1901 02/07/19 0433  HGB 12.7  --   --  10.7*  HCT 38.1  --   --  32.1*  PLT 220  --   --  157  CREATININE 0.98  --   --  0.65  TROPONINIHS 30* 1,397* 6,543* 7,916*    Estimated Creatinine Clearance: 47.2 mL/min (by C-G formula based on SCr of 0.65 mg/dL).   Assessment: Pharmacy is consulted to dose heparin drip in 78 yo female with rising troponins.  Pt was started on Eliquis after cardioversion and received a dose on 10/15 at 1634.   S/p cath - to resume heparin tonight with plans to resumed Eliquis in AM   Goal of Therapy:  Heparin level 0.3-0.7 units/ml Monitor platelets by anticoagulation protocol: Yes   Plan:  Heparin at 700 units / hr at 10 pm PTT in AM  Thank you Anette Guarneri, PharmD [phone]  02/07/2019 3:01 PM

## 2019-02-07 NOTE — Progress Notes (Signed)
Trop=[id number], notified Schorr NP via amnion and didn't receive a  new order. Patient denies any chest pain or SOB.

## 2019-02-08 ENCOUNTER — Other Ambulatory Visit: Payer: Self-pay | Admitting: Physician Assistant

## 2019-02-08 ENCOUNTER — Telehealth: Payer: Self-pay | Admitting: Physician Assistant

## 2019-02-08 ENCOUNTER — Encounter (HOSPITAL_COMMUNITY): Payer: Self-pay | Admitting: Cardiovascular Disease

## 2019-02-08 DIAGNOSIS — I248 Other forms of acute ischemic heart disease: Secondary | ICD-10-CM

## 2019-02-08 LAB — BASIC METABOLIC PANEL
Anion gap: 9 (ref 5–15)
BUN: 10 mg/dL (ref 8–23)
CO2: 22 mmol/L (ref 22–32)
Calcium: 8.7 mg/dL — ABNORMAL LOW (ref 8.9–10.3)
Chloride: 104 mmol/L (ref 98–111)
Creatinine, Ser: 0.61 mg/dL (ref 0.44–1.00)
GFR calc Af Amer: >60 mL/min (ref >60)
GFR calc non Af Amer: >60 mL/min (ref >60)
Glucose, Bld: 98 mg/dL (ref 70–99)
Potassium: 3.7 mmol/L (ref 3.5–5.1)
Sodium: 135 mmol/L (ref 135–145)

## 2019-02-08 LAB — CBC
HCT: 33.3 % — ABNORMAL LOW (ref 36.0–46.0)
Hemoglobin: 11.5 g/dL — ABNORMAL LOW (ref 12.0–15.0)
MCH: 32.4 pg (ref 26.0–34.0)
MCHC: 34.5 g/dL (ref 30.0–36.0)
MCV: 93.8 fL (ref 80.0–100.0)
Platelets: 147 10*3/uL — ABNORMAL LOW (ref 150–400)
RBC: 3.55 MIL/uL — ABNORMAL LOW (ref 3.87–5.11)
RDW: 12.8 % (ref 11.5–15.5)
WBC: 4.8 10*3/uL (ref 4.0–10.5)
nRBC: 0 % (ref 0.0–0.2)

## 2019-02-08 LAB — APTT: aPTT: 55 seconds — ABNORMAL HIGH (ref 24–36)

## 2019-02-08 MED ORDER — METOPROLOL TARTRATE 25 MG PO TABS: 12.5000 mg | ORAL_TABLET | Freq: Two times a day (BID) | ORAL | 11 refills | Status: DC

## 2019-02-08 MED ORDER — ACETAMINOPHEN 325 MG PO TABS
650.0000 mg | ORAL_TABLET | Freq: Four times a day (QID) | ORAL | Status: DC | PRN
  Administered 2019-02-08 (×2): 650 mg via ORAL
  Filled 2019-02-08 (×2): qty 2

## 2019-02-08 MED ORDER — APIXABAN 5 MG PO TABS
5.0000 mg | ORAL_TABLET | Freq: Two times a day (BID) | ORAL | Status: DC
  Administered 2019-02-08: 5 mg via ORAL
  Filled 2019-02-08: qty 1

## 2019-02-08 MED ORDER — IRBESARTAN 75 MG PO TABS
37.5000 mg | ORAL_TABLET | Freq: Every day | ORAL | Status: DC
  Administered 2019-02-08: 37.5 mg via ORAL
  Filled 2019-02-08: qty 1

## 2019-02-08 MED FILL — Heparin Sodium (Porcine) Inj 1000 Unit/ML: INTRAMUSCULAR | Qty: 10 | Status: AC

## 2019-02-08 NOTE — Progress Notes (Signed)
TR BAND REMOVAL  LOCATION:    right radial  DEFLATED PER PROTOCOL:    Yes.    TIME BAND OFF / DRESSING APPLIED:    2000   SITE UPON ARRIVAL:    Level 0  SITE AFTER BAND REMOVAL:    Level 0  CIRCULATION SENSATION AND MOVEMENT:    Within Normal Limits   Yes.    COMMENTS:   Pt.tolerated well

## 2019-02-08 NOTE — Progress Notes (Signed)
Pt.c/o chest pain when she takes deep breath & soreness on the femoral site . EKG done & MD on call notified .Tylenol given.

## 2019-02-08 NOTE — Telephone Encounter (Signed)
Patient called the after hour answering service regarding her metoprolol. She picked a Rx of metoprolol 25mg  daily PRN from her pharmacy today, although this is different from her discharge Rx which is 12.5mg  BID of metoprolol tartrate. She says the name of physician on her Rx bottle is Dr. Noemi Chapel who was the ED physician who saw her on 10/14. I urged her to followup the discharge medication list and take her metoprolol tartrate as 12.5mg  BID. Further medication adjust per primary cardiologist on followup

## 2019-02-08 NOTE — Discharge Summary (Addendum)
Discharge Summary    Patient ID: MARIETTE SLIMAN MRN: [id number]; DOB: 03/20/1941  Admit date: 02/06/2019 Discharge date: 02/08/2019  Primary Care Provider: Caryl Bis, MD  Primary Cardiologist: Rozann Lesches, MD  Primary Electrophysiologist:  None   Discharge Diagnoses    Principal Problem:   Atrial fibrillation with RVR [facility]) Active Problems:   Essential hypertension Demand ischemia  Allergies Allergies  Allergen Reactions  . Phenobarbital Hives and Swelling  . Tamiflu [Oseltamivir Phosphate] Swelling and Rash  . Depakote [Valproic Acid] Hives and Itching  . Lisinopril Swelling    PT can only take low dosage    Diagnostic Studies/Procedures    CARDIAC CATH: 02/07/2019  The left ventricular systolic function is normal.  LV end diastolic pressure is normal.  The left ventricular ejection fraction is greater than 65% by visual estimate.  There is no mitral valve regurgitation.  1. No angiographic evidence of CAD 2. Normal LV systolic function' 3. Elevation in troponin likely due to demand ischemia in setting of rapid atrial fibrillation  Recommendation: Resume heparin 8 hours post sheath pull. Will start oral anti-coagulation in the morning. No further ischemic workup _____________   History of Present Illness     DAWNNA BORROEL is a 78 y.o. female with hx HTN, ectopic atrial tachycardia was admitted to [facility] w/ Afib, RVR >>DCCV>>SR, NSTEMI by enzymes.  Transferred to [facility] for cath.  Hospital Course     Consultants: None   Cardiac catheterization was performed on 02/07/2019, results are above.  She has no significant coronary artery disease and her EF is normal.  Dr. Irish Lack reviewed the results and felt the troponin elevation came from demand ischemia secondary to her atrial fibrillation.  After the heart catheterization, she developed some chest pain that was worse with deep inspiration.  It was improved by holding a pillow to her chest.   She was also having some leg pain at the cath site.  Symptoms improved with Tylenol.  Her CHA2DS2-VASc score was 4.  She was initially on heparin but after the cath, was changed to Eliquis.  Because of the rapid atrial fibrillation, her blood pressure medications had been held.  She was restarted on her home dose of Diovan, and Dr Abbie Sons added metoprolol 12.5 mg twice daily to her medication regimen.  On 10/16, she was seen by Dr. Irish Lack and all data were reviewed.  Her chest pain had improved.  She was ambulating without difficulty.  No further inpatient work-up is indicated and she is considered stable for discharge, to follow up as an outpatient.  Did the patient have an acute coronary syndrome (MI, NSTEMI, STEMI, etc) this admission?:  No.   The elevated Troponin was due to the acute medical illness (demand ischemia).  _____________  Discharge Vitals Blood pressure (!) 152/86, pulse 75, temperature 98.5 F (36.9 C), temperature source Oral, resp. rate 18, height 5\' 3"  (1.6 m), weight 56.1 kg, SpO2 96 %.  Filed Weights   02/06/19 1300 02/08/19 0608  Weight: 55.3 kg 56.1 kg    Labs & Radiologic Studies    CBC Recent Labs    02/06/19 1307 02/07/19 0433 02/08/19 0807  WBC 9.0 5.7 4.8  NEUTROABS 6.6  --   --   HGB 12.7 10.7* 11.5*  HCT 38.1 32.1* 33.3*  MCV 97.2 97.6 93.8  PLT 220 157 [id number]*   Basic Metabolic Panel Recent Labs    02/07/19 0433 02/08/19 0807  NA 137 135  K 3.8 3.7  CL 106 104  CO2 22 22  GLUCOSE 93 98  BUN 18 10  CREATININE 0.65 0.61  CALCIUM 8.5* 8.7*   High Sensitivity Troponin:   Recent Labs  Lab 02/06/19 1307 02/06/19 1518 02/06/19 1901 02/07/19 0433  TROPONINIHS 30* 1,397* 6,543* 7,916*    Thyroid Function Tests Recent Labs    02/06/19 1307  TSH 2.815   _____________  Dg Chest Port 1 View  Result Date: 02/06/2019 CLINICAL DATA:  Sudden onset palpitations and blurry vision. EXAM: PORTABLE CHEST 1 VIEW COMPARISON:  Chest x-ray  dated February 28, 2017. FINDINGS: The heart size and mediastinal contours are within normal limits. Both lungs are clear. The visualized skeletal structures are unremarkable. IMPRESSION: No active disease. Electronically Signed   By: Titus Dubin M.D.   On: 02/06/2019 14:00   Disposition   Pt is being discharged home today in good condition.  Follow-up Plans & Appointments    Follow-up Information    [facility].   Specialty: Emergency Medicine Why: If symptoms worsen Contact information: [address] [id number] mc [place] [id number] [place], [place] M, PA-C Follow up on 02/22/2019.   Specialties: Librarian, academic, Cardiology Why: Please arrive at 3:15 PM for 3:30 PM appointment Contact information: [place] [address] [phone]          Discharge Instructions    Diet - low sodium heart healthy   Complete by: As directed    Increase activity slowly   Complete by: As directed       Discharge Medications   Allergies as of 02/08/2019      Reactions   Phenobarbital Hives, Swelling   Tamiflu [oseltamivir Phosphate] Swelling, Rash   Depakote [valproic Acid] Hives, Itching   Lisinopril Swelling   PT can only take low dosage      Medication List    TAKE these medications   apixaban 5 MG Tabs tablet Commonly known as: ELIQUIS Take 1 tablet (5 mg total) by mouth 2 (two) times daily.   atorvastatin 40 MG tablet Commonly known as: LIPITOR Take 20 mg by mouth on Monday, Wednesday, Friday What changed: additional instructions   B-COMPLEX-C PO Take 1 tablet daily by mouth.   CALCARB 600/D PO Take 1 tablet by mouth daily.   cholecalciferol 1000 units tablet Commonly known as: VITAMIN D Take 1,000 Units daily by mouth.   COD LIVER OIL PO Take 1 capsule by mouth 2 (two) times a week.   MAGNESIUM PO Take 1 tablet by mouth 3 (three) times a week.   metoprolol tartrate 25 MG  tablet Commonly known as: LOPRESSOR Take 0.5 tablets (12.5 mg total) by mouth 2 (two) times daily.   multivitamin with minerals Tabs tablet Take 1 tablet daily by mouth.   Potassium 99 MG Tabs Take 1 tablet 3 (three) times a week by mouth.   valsartan 40 MG tablet Commonly known as: DIOVAN Take 0.5 tablets (20 mg total) by mouth daily.   vitamin A 10000 UNIT capsule Take 10,000 Units by mouth 2 (two) times a week.   vitamin C 500 MG tablet Commonly known as: ASCORBIC ACID Take 500 mg daily by mouth.   vitamin E 400 UNIT capsule Take 400 Units by mouth once a week.   ZINC PO Take 1 tablet by mouth daily.   zolpidem 5 MG tablet Commonly known as: AMBIEN Take 2.5-5 mg by mouth at bedtime as needed for sleep.  Outstanding Labs/Studies   None  Duration of Discharge Encounter   Greater than 30 minutes including physician time.  Signed, Rosaria Ferries, PA-C 02/08/2019, 1:50 PM  I have examined the patient and reviewed assessment and plan and discussed with patient.  Agree with above as stated.  Right groin stable.  OK to switch from heparin to Eliquis for stroke prevention.  Restart Valsartan as well.  BP has been high.  Will try to have her ambulate.  If does well, she could possibly go home today.  Add metoprolol 12.5 mg BID.   Demand ischemia in the setting of AFib with RVR was the cause of elevated troponin.  F/u with Dr. Rennis Golden

## 2019-02-08 NOTE — Progress Notes (Signed)
Discharge order written - instructions reviewed with patient and her daughter. All questions answered. Patient discharged via wheelchair in stable condition into the care of her daughter.

## 2019-02-08 NOTE — Progress Notes (Addendum)
Progress Note  Patient Name: Kristen Mckenzie Date of Encounter: 02/08/2019  Primary Cardiologist:  Rozann Lesches, MD  Subjective   Chest pain is improved, it was severe with deep inspiration last pm. Hugging a pillow and Tylenol helped. Pain not gone yet. No SOB. R groin still tender, but not bad.  Inpatient Medications    Scheduled Meds: . aspirin EC  81 mg Oral Daily  . atorvastatin  20 mg Oral q1800  . sodium chloride flush  3 mL Intravenous Q12H  . sodium chloride flush  3 mL Intravenous Q12H   Continuous Infusions: . sodium chloride    . heparin 700 Units/hr (02/07/19 2200)   PRN Meds: sodium chloride, acetaminophen, sodium chloride flush, zolpidem   Vital Signs    Vitals:   02/07/19 2328 02/08/19 0031 02/08/19 0600 02/08/19 0608  BP: (!) 158/80 (!) 165/80 (!) 152/86   Pulse: 68 67 75   Resp:   18   Temp:   98.5 F (36.9 C)   TempSrc:   Oral   SpO2: 95% 92% 96%   Weight:    56.1 kg  Height:        Intake/Output Summary (Last 24 hours) at 02/08/2019 0718 Last data filed at 02/08/2019 [id number] Gross per 24 hour  Intake 1129.66 ml  Output 1200 ml  Net -70.34 ml   Filed Weights   02/06/19 1300 02/08/19 0608  Weight: 55.3 kg 56.1 kg   Last Weight  Most recent update: 02/08/2019  6:09 AM   Weight  56.1 kg (123 lb 11.2 oz)           Weight change: 0.771 kg   Telemetry    SR - Personally Reviewed  ECG    10/16 ECG is SR, HR 72, no acute ischemic changes, no ST elevation  - Personally Reviewed  Physical Exam   General: Well developed, well nourished, female appearing in no acute distress. Head: Normocephalic, atraumatic.  Neck: Supple without bruits, JVD not elevated. Lungs:  Resp regular and unlabored, CTA. Heart: RRR, S1, S2, no S3, S4, soft murmur; no rub. Abdomen: Soft, non-tender, non-distended with normoactive bowel sounds. No hepatomegaly. No rebound/guarding. No obvious abdominal masses. Extremities: No clubbing, cyanosis, no  edema. Distal pedal pulses are 2+ bilaterally. R femoral cath site w/out ecchymosis or hematoma Neuro: Alert and oriented X 3. Moves all extremities spontaneously. Psych: Normal affect.  Labs    Hematology Recent Labs  Lab 02/06/19 1307 02/07/19 0433  WBC 9.0 5.7  RBC 3.92 3.29*  HGB 12.7 10.7*  HCT 38.1 32.1*  MCV 97.2 97.6  MCH 32.4 32.5  MCHC 33.3 33.3  RDW 13.1 13.1  PLT 220 157    Chemistry Recent Labs  Lab 02/06/19 1307 02/07/19 0433  NA 137 137  K 4.1 3.8  CL 104 106  CO2 24 22  GLUCOSE 125* 93  BUN 19 18  CREATININE 0.98 0.65  CALCIUM 9.0 8.5*  GFRNONAA 55* >60  GFRAA >60 >60  ANIONGAP 9 9     High Sensitivity Troponin:   Recent Labs  Lab 02/06/19 1307 02/06/19 1518 02/06/19 1901 02/07/19 0433  TROPONINIHS 30* 1,397* 6,543* 7,916*       Radiology    Dg Chest Port 1 View  Result Date: 02/06/2019 CLINICAL DATA:  Sudden onset palpitations and blurry vision. EXAM: PORTABLE CHEST 1 VIEW COMPARISON:  Chest x-ray dated February 28, 2017. FINDINGS: The heart size and mediastinal contours are within normal limits. Both lungs are clear.  The visualized skeletal structures are unremarkable. IMPRESSION: No active disease. Electronically Signed   By: Titus Dubin M.D.   On: 02/06/2019 14:00     Cardiac Studies   CARDIAC CATH: 02/07/2019  The left ventricular systolic function is normal.  LV end diastolic pressure is normal.  The left ventricular ejection fraction is greater than 65% by visual estimate.  There is no mitral valve regurgitation.   1. No angiographic evidence of CAD 2. Normal LV systolic function' 3. Elevation in troponin likely due to demand ischemia in setting of rapid atrial fibrillation  Recommendation: Resume heparin 8 hours post sheath pull. Will start oral anti-coagulation in the morning. No further ischemic workup.   Patient Profile     78 y.o. female w/ hx HTN, ectopic atrial tachycardia was admitted to [facility] w/  Afib, RVR >>DCCV>>SR, NSTEMI by enzymes.  Assessment & Plan    1. Demand ischemia:  - no CAD at cath - significant troponin elevation, ? From  DCCV vs myopericarditis, ECG is normal - sx improved w/ Tylenol, continue this  2. Afib, RVR - s/p DCCV in ER>>SR - hx palpitations, felt to be atrial tach - CHA2DS2-VASc = 3 (age x 2, female, HTN) - Change to Eliquis this am, per pharmacy  3. HTN - pta on Diovan 20 mg qd, restart - may need higher dose - follow   Plan: once med regimen finalized, d/c if pt ambulates and does well.  Principal Problem:   Non-ST elevation (NSTEMI) myocardial infarction [facility]) Active Problems:   Essential hypertension   Atrial fibrillation with RVR ([place])    Signed, Rosaria Ferries , PA-C 7:18 AM 02/08/2019 Pager: [phone]  I have examined the patient and reviewed assessment and plan and discussed with patient.  Agree with above as stated.  Right groin stable.  OK to switch from heparin to Eliquis.  Restart Valsartan as well.  BP has been high.  Will try to have her ambulate.  If does well, she could possibly go home today.  Add metoprolol 12.5 mg BID.   Demand ischemia in the setting of AFib with RVR was the cause of elevated troponin.  Larae Grooms

## 2019-02-08 NOTE — Discharge Instructions (Signed)
•   Your testing today shows that you had an irregular heartbeat called atrial fibrillation or "A. fib" for short period  This type of problem can cause ongoing abnormal heart problems such as as fast heart rate.  It appeared that you had a heart murmur today, please have your cardiologist follow this up this week  You will need to take the following medications    Eliquis, 5 mg by mouth twice a day  Metoprolol, 25 mg by mouth once as needed if your heart rate is fast for more than 5 minutes   As you follow-up with Dr. Domenic Polite in cardiology clinic in the next week they will give you further guidance on how to manage this going forward.   Please return to the emergency department for any severe or worsening symptoms including fast heartbeat, chest pain, shortness of breath, weakness or any other severe or worsening symptoms. ------------  Information on my medicine - ELIQUIS (apixaban)  Why was Eliquis prescribed for you? Eliquis was prescribed for you to reduce the risk of a blood clot forming that can cause a stroke if you have a medical condition called atrial fibrillation (a type of irregular heartbeat).  What do You need to know about Eliquis ? Take your Eliquis TWICE DAILY - one tablet in the morning and one tablet in the evening with or without food. If you have difficulty swallowing the tablet whole please discuss with your pharmacist how to take the medication safely.  Take Eliquis exactly as prescribed by your doctor and DO NOT stop taking Eliquis without talking to the doctor who prescribed the medication.  Stopping may increase your risk of developing a stroke.  Refill your prescription before you run out.  After discharge, you should have regular check-up appointments with your healthcare provider that is prescribing your Eliquis.  In the future your dose may need to be changed if your kidney function or weight changes by a significant amount or as you get  older.  What do you do if you miss a dose? If you miss a dose, take it as soon as you remember on the same day and resume taking twice daily.  Do not take more than one dose of ELIQUIS at the same time to make up a missed dose.  Important Safety Information A possible side effect of Eliquis is bleeding. You should call your healthcare provider right away if you experience any of the following: ? Bleeding from an injury or your nose that does not stop. ? Unusual colored urine (red or dark brown) or unusual colored stools (red or black). ? Unusual bruising for unknown reasons. ? A serious fall or if you hit your head (even if there is no bleeding).  Some medicines may interact with Eliquis and might increase your risk of bleeding or clotting while on Eliquis. To help avoid this, consult your healthcare provider or pharmacist prior to using any new prescription or non-prescription medications, including herbals, vitamins, non-steroidal anti-inflammatory drugs (NSAIDs) and supplements.  This website has more information on Eliquis (apixaban): http://www.eliquis.com/eliquis/home

## 2019-02-11 ENCOUNTER — Telehealth: Payer: Self-pay | Admitting: Cardiology

## 2019-02-11 DIAGNOSIS — R1031 Right lower quadrant pain: Secondary | ICD-10-CM

## 2019-02-11 NOTE — Telephone Encounter (Signed)
Reports having heart cath done last week from right side groin site. Reports having a big, hard knot that appeared this morning that is tender to touch. Described as oblong and size 1&1/2 inch and about the size of a quarter. Denies bruising, redness, fever chills, nv, bleeding, drainage or leg discoloration. Advised this is not uncommon and that she should continue to monitor site for any changes and if this occurs, to contact our office. Verbalized understanding of plan.

## 2019-02-11 NOTE — Telephone Encounter (Signed)
Had CATH last week  -had a hard knot at site where they went in come up this morning

## 2019-02-12 NOTE — Telephone Encounter (Signed)
Patient called stating that she is still concerned with the knot on her right side groin area.

## 2019-02-12 NOTE — Telephone Encounter (Signed)
Yes Alma Friendly, please take a look at the area.  If there is any concern that this could be a pseudoaneurysm, would get a vascular ultrasound done to have it evaluated.

## 2019-02-12 NOTE — Telephone Encounter (Signed)
Presented to office to have right groin assessed. Raised elongated 3 inch area to right groin cath site that has some swelling and is tender to touch. Minimal bruising. Observed by Dr. Bronson Ing who recommended an u/s would be needed to assess vessel. Will forward to [facility] to obtain order.

## 2019-02-12 NOTE — Telephone Encounter (Signed)
Patient called because she is concerned about the knot in her right groin area. Reports no changes but is still sore. Advised patient to come to the office so that her knot could be assessed. Verbalized understanding.

## 2019-02-12 NOTE — Telephone Encounter (Signed)
This is an arterial vascular ultrasound, I am not certain that we do these in the office in [place], you could check with the sonographer.  The specific question is whether there is a pseudoaneurysm or not.  I know that these are done by the vascular department at [facility].  I do not know the specific order, would check with the vascular lab.

## 2019-02-13 ENCOUNTER — Telehealth: Payer: Self-pay | Admitting: *Deleted

## 2019-02-13 ENCOUNTER — Telehealth: Payer: Self-pay | Admitting: Cardiology

## 2019-02-13 ENCOUNTER — Ambulatory Visit (INDEPENDENT_AMBULATORY_CARE_PROVIDER_SITE_OTHER): Payer: Medicare Other

## 2019-02-13 ENCOUNTER — Other Ambulatory Visit: Payer: Self-pay

## 2019-02-13 DIAGNOSIS — R1031 Right lower quadrant pain: Secondary | ICD-10-CM | POA: Diagnosis not present

## 2019-02-13 NOTE — Telephone Encounter (Signed)
-----   Message from Satira Sark, MD sent at 02/13/2019  1:31 PM EDT ----- Results reviewed.  Please let her know that the ultrasound did not show any evidence of clot or pseudoaneurysm, no AV fistula.  Would continue with conservative measures, warm compress if needed.

## 2019-02-13 NOTE — Addendum Note (Signed)
Addended by: Merlene Laughter on: 02/13/2019 08:09 AM   Modules accepted: Orders

## 2019-02-13 NOTE — Telephone Encounter (Signed)
Pre-cert Verification for the following procedure    [facility] [place] Groin Pseudoaneurysm scheduled for 02-13-2019 at [facility]

## 2019-02-14 NOTE — Telephone Encounter (Signed)
Patient informed. Copy sent to PCP °

## 2019-02-21 NOTE — Progress Notes (Deleted)
Cardiology Office Note    Date:  02/21/2019   ID:  Kristen Mckenzie 1941-03-05, MRN [id number]  PCP:  Caryl Bis, MD  Cardiologist: Rozann Lesches, MD    No chief complaint on file.   History of Present Illness:    Kristen Mckenzie is a 78 y.o. female with past medical history of HTN and ectopic atrial tachycardia who presents to the office today for hospital follow-up.  She most recently presented to [facility], ED on 02/06/2019 for evaluation of new onset palpitations and was found to have a heart rate in the 150s to 170s and be in atrial fibrillation with RVR.  She underwent successful cardioversion while in the ED and was initially going to be discharged home but her HS Troponin value increased from 30 to 1379 and she was monitored overnight for further evaluation.  Her repeat troponin value was significantly elevated at 7916. She was started on IV Heparin and transferred to [facility] for a cardiac catheterization to rule out any obstructive CAD. This was performed by Dr. Angelena Form on 02/07/2019 and showed no angiographic evidence of CAD and it was felt that troponin elevation was likely secondary to demand ischemia in the setting of rapid atrial fibrillation.  On 02/08/2019 she reported some episodes of chest discomfort which was worse with deep inspiration and improved with positional changes.  Transition from heparin to Eliquis 5 mg twice daily and started on Lopressor 12.5 mg twice daily.  She called the office on 02/11/2019 reporting swelling along her groin catheterization site.  For a nurse visit and was found to have a raised elongated area along the site with some swelling and minimal bruising.  It was recommended that an ultrasound be obtained to rule out a pseudoaneurysm.  This was performed on 02/13/2019 and showed no evidence of clot or pseudoaneurysm. Was recommended to continue with conservative measures including a warm compress and Tylenol.  Past Medical  History:  Diagnosis Date  . Anxiety   . Arthritis   . Cholecystolithiasis   . Collagen vascular disease ([place])   . Cystocele with prolapse   . Ectopic atrial tachycardia ([place])   . Hypertension   . Rectocele     Past Surgical History:  Procedure Laterality Date  . ANTERIOR AND POSTERIOR REPAIR N/A 02/21/2018   Procedure: ANTERIOR (CYSTOCELE) REPAIR;  Surgeon: Bjorn Loser, MD;  Location: [place];  Service: Urology;  Laterality: N/A;  . CYSTOSCOPY N/A 02/21/2018   Procedure: CYSTOSCOPY;  Surgeon: Bjorn Loser, MD;  Location: [facility];  Service: Urology;  Laterality: N/A;  . EYE SURGERY Bilateral 2011   cataract extraction   . LAPAROSCOPIC VAGINAL HYSTERECTOMY WITH SALPINGO OOPHORECTOMY Bilateral 02/21/2018   Procedure: LAPAROSCOPIC ASSISTED VAGINAL HYSTERECTOMY WITH SALPINGO OOPHORECTOMY;  Surgeon: Maisie Fus, MD;  Location: [facility];  Service: Gynecology;  Laterality: Bilateral;  Dr. Matilde Sprang to follow  . LEFT HEART CATH AND CORONARY ANGIOGRAPHY N/A 02/07/2019   Procedure: LEFT HEART CATH AND CORONARY ANGIOGRAPHY;  Surgeon: Burnell Blanks, MD;  Location: [facility];  Service: Cardiovascular;  Laterality: N/A;    Current Medications: Outpatient Medications Prior to Visit  Medication Sig Dispense Refill  . apixaban (ELIQUIS) 5 MG TABS tablet Take 1 tablet (5 mg total) by mouth 2 (two) times daily. 60 tablet 0  . atorvastatin (LIPITOR) 40 MG tablet Take 20 mg by mouth on Monday, Wednesday, Friday (Patient taking differently: Take 20 mg by mouth  on Monday, Wednesday, Friday in the evening) 36 tablet 3  . B-COMPLEX-C PO Take 1 tablet daily by mouth.    . Calcium Carbonate-Vitamin D (CALCARB 600/D PO) Take 1 tablet by mouth daily.     . cholecalciferol (VITAMIN D) 1000 units tablet Take 1,000 Units daily by mouth.    . COD LIVER OIL PO Take 1 capsule by mouth 2 (two) times a week.     Marland Kitchen MAGNESIUM PO  Take 1 tablet by mouth 3 (three) times a week.     . metoprolol tartrate (LOPRESSOR) 25 MG tablet Take 0.5 tablets (12.5 mg total) by mouth 2 (two) times daily. 33 tablet 11  . Multiple Vitamin (MULTIVITAMIN WITH MINERALS) TABS tablet Take 1 tablet daily by mouth.    . Multiple Vitamins-Minerals (ZINC PO) Take 1 tablet by mouth daily.     . Potassium 99 MG TABS Take 1 tablet 3 (three) times a week by mouth.    . valsartan (DIOVAN) 40 MG tablet Take 0.5 tablets (20 mg total) by mouth daily. 45 tablet 3  . vitamin A 10000 UNIT capsule Take 10,000 Units by mouth 2 (two) times a week.     . vitamin C (ASCORBIC ACID) 500 MG tablet Take 500 mg daily by mouth.    . vitamin E 400 UNIT capsule Take 400 Units by mouth once a week.     . zolpidem (AMBIEN) 5 MG tablet Take 2.5-5 mg by mouth at bedtime as needed for sleep.      No facility-administered medications prior to visit.      Allergies:   Phenobarbital, Tamiflu [oseltamivir phosphate], Depakote [valproic acid], and Lisinopril   Social History   Socioeconomic History  . Marital status: Widowed    Spouse name: Not on file  . Number of children: Not on file  . Years of education: Not on file  . Highest education level: Not on file  Occupational History  . Occupation: retired  Scientific laboratory technician  . Financial resource strain: Not hard at all  . Food insecurity    Worry: Never true    Inability: Never true  . Transportation needs    Medical: No    Non-medical: No  Tobacco Use  . Smoking status: Never Smoker  . Smokeless tobacco: Never Used  Substance and Sexual Activity  . Alcohol use: No    Frequency: Never  . Drug use: No  . Sexual activity: Not on file  Lifestyle  . Physical activity    Days per week: 7 days    Minutes per session: 30 min  . Stress: Not at all  Relationships  . Social connections    Talks on phone: More than three times a week    Gets together: More than three times a week    Attends religious service: More than  4 times per year    Active member of club or organization: Yes    Attends meetings of clubs or organizations: More than 4 times per year    Relationship status: Widowed  Other Topics Concern  . Not on file  Social History Narrative  . Not on file     Family History:  [name] ***family history includes Hypertension in her father.   Review of Systems:   Please see the history of present illness.     General:  No chills, fever, night sweats or weight changes.  Cardiovascular:  No chest pain, dyspnea on exertion, edema, orthopnea, palpitations, paroxysmal nocturnal dyspnea. Dermatological:  No rash, lesions/masses Respiratory: No cough, dyspnea Urologic: No hematuria, dysuria Abdominal:   No nausea, vomiting, diarrhea, bright red blood per rectum, melena, or hematemesis Neurologic:  No visual changes, wkns, changes in mental status. All other systems reviewed and are otherwise negative except as noted above.   Physical Exam:    VS:  There were no vitals taken for this visit.   General: Well developed, well nourished,female appearing in no acute distress. Head: Normocephalic, atraumatic, sclera non-icteric, no xanthomas, nares are without discharge.  Neck: No carotid bruits. JVD not elevated.  Lungs: Respirations regular and unlabored, without wheezes or rales.  Heart: ***Regular rate and rhythm. No S3 or S4.  No murmur, no rubs, or gallops appreciated. Abdomen: Soft, non-tender, non-distended with normoactive bowel sounds. No hepatomegaly. No rebound/guarding. No obvious abdominal masses. Msk:  Strength and tone appear normal for age. No joint deformities or effusions. Extremities: No clubbing or cyanosis. No edema.  Distal pedal pulses are 2+ bilaterally. Neuro: Alert and oriented X 3. Moves all extremities spontaneously. No focal deficits noted. Psych:  Responds to questions appropriately with a normal affect. Skin: No rashes or lesions noted  Wt Readings from Last 3  Encounters:  02/08/19 123 lb 11.2 oz (56.1 kg)  12/21/18 126 lb 6.4 oz (57.3 kg)  06/13/18 123 lb (55.8 kg)        Studies/Labs Reviewed:   EKG:  EKG is*** ordered today.  The ekg ordered today demonstrates ***  Recent Labs: 02/06/2019: TSH 2.815 02/08/2019: BUN 10; Creatinine, Ser 0.61; Hemoglobin 11.5; Platelets 147; Potassium 3.7; Sodium 135   Lipid Panel    Component Value Date/Time   CHOL 222 (H) 03/01/2017 0458   TRIG 109 03/01/2017 0458   HDL 47 03/01/2017 0458   CHOLHDL 4.7 03/01/2017 0458   VLDL 22 03/01/2017 0458   LDLCALC 153 (H) 03/01/2017 0458    Additional studies/ records that were reviewed today include:   Echocardiogram: 12/2018 IMPRESSIONS    1. Left ventricular ejection fraction, by visual estimation, is 70 to 75%. The left ventricle has hyperdynamic function. Normal left ventricular posterior wall thickness. There is no left ventricular hypertrophy.  2. Elevated mean left atrial pressure.  3. Left ventricular diastolic Doppler parameters are consistent with impaired relaxation pattern of LV diastolic filling.  4. There is focal basal septal LV hypertrophy. In the setting of focal basal septal hypertrophy and hyperdynamic LV function there is SAM of the anterior mitral valve leaflet and a dynamic LVOT gradient with a peak of 32 mmHg.  5. Global right ventricle has normal systolic function.The right ventricular size is normal. No increase in right ventricular wall thickness.  6. Left atrial size was mildly dilated.  7. Right atrial size was normal.  8. Moderate mitral annular calcification.  9. The mitral valve is abnormal. Mild mitral valve regurgitation. No evidence of mitral stenosis. 10. The tricuspid valve is normal in structure. Tricuspid valve regurgitation is mild. 11. The aortic valve is tricuspid Aortic valve regurgitation was not visualized by color flow Doppler. Structurally normal aortic valve, with no evidence of sclerosis or stenosis.  12. The pulmonic valve was not well visualized. Pulmonic valve regurgitation is not visualized by color flow Doppler. 13. Mildly elevated pulmonary artery systolic pressure. 14. PASP is borderline elevated at 30 mmHg.   Cardiac Catheterization: 02/07/2019  The left ventricular systolic function is normal.  LV end diastolic pressure is normal.  The left ventricular ejection fraction is greater than 65% by visual estimate.  There is no mitral valve regurgitation.   1. No angiographic evidence of CAD 2. Normal LV systolic function' 3. Elevation in troponin likely due to demand ischemia in setting of rapid atrial fibrillation  Recommendation: Resume heparin 8 hours post sheath pull. Will start oral anti-coagulation in the morning. No further ischemic workup.    Pseudoaneurysm Doppler: 01/2019 Summary: No evidence of pseudoaneurysm, AVF or DVT  Assessment:    No diagnosis found.   Plan:   In order of problems listed above:  1. ***    Medication Adjustments/Labs and Tests Ordered: Current medicines are reviewed at length with the patient today.  Concerns regarding medicines are outlined above.  Medication changes, Labs and Tests ordered today are listed in the Patient Instructions below. There are no Patient Instructions on file for this visit.   Signed, Kristen Heritage, PA-C  02/21/2019 12:06 PM    [facility] [address] [place], [place] [address] Phone: [phone] Fax: [phone]

## 2019-02-22 ENCOUNTER — Ambulatory Visit: Payer: Medicare Other | Admitting: Student

## 2019-03-11 ENCOUNTER — Telehealth: Payer: Self-pay | Admitting: Cardiology

## 2019-03-11 MED ORDER — APIXABAN 5 MG PO TABS: 5.0000 mg | ORAL_TABLET | Freq: Two times a day (BID) | ORAL | 6 refills | Status: DC

## 2019-03-11 NOTE — Telephone Encounter (Signed)
Patient informed that she does need to continue eliquis. Refill requested and sent to [facility].

## 2019-03-11 NOTE — Telephone Encounter (Signed)
Asking if she needs to continue taking Eliquis

## 2019-03-14 ENCOUNTER — Encounter: Payer: Self-pay | Admitting: Cardiology

## 2019-03-14 ENCOUNTER — Ambulatory Visit (INDEPENDENT_AMBULATORY_CARE_PROVIDER_SITE_OTHER): Payer: Medicare Other | Admitting: Cardiology

## 2019-03-14 ENCOUNTER — Other Ambulatory Visit: Payer: Self-pay

## 2019-03-14 VITALS — BP 142/88 | HR 73 | Ht 63.0 in | Wt 126.0 lb

## 2019-03-14 DIAGNOSIS — I480 Paroxysmal atrial fibrillation: Secondary | ICD-10-CM | POA: Diagnosis not present

## 2019-03-14 DIAGNOSIS — I10 Essential (primary) hypertension: Secondary | ICD-10-CM | POA: Diagnosis not present

## 2019-03-14 MED ORDER — APIXABAN 5 MG PO TABS: 5.0000 mg | ORAL_TABLET | Freq: Two times a day (BID) | ORAL | 0 refills | Status: DC

## 2019-03-14 MED ORDER — VALSARTAN 40 MG PO TABS: 40.0000 mg | ORAL_TABLET | Freq: Every day | ORAL | 3 refills | Status: DC

## 2019-03-14 NOTE — Progress Notes (Signed)
Cardiology Office Note  Date: 03/14/2019   ID: [name], [name] 1940-09-10, MRN [id number]  PCP:  Caryl Bis, MD  Cardiologist:  Rozann Lesches, MD Electrophysiologist:  None   Chief Complaint  Patient presents with  . Hospitalization Follow-up    History of Present Illness: Kristen Mckenzie is a 78 y.o. female presenting for a post hospital visit.  She was hospitalized in October with newly documented rapid atrial fibrillation that was cardioverted in the ER however she was subsequently found to have elevation in high-sensitivity troponin I levels (peak 7916) resulting in diagnostic cardiac catheterization.  Fortunately, procedure revealed no angiographic evidence of CAD and this was felt to be most consistent with a type II NSTEMI secondary to demand ischemia.  She was managed medically and started on Eliquis for stroke prophylaxis.  She did complain of swelling and pain subsequently at the right groin site after discharge, Doppler studies did not show evidence of pseudoaneurysm or AV fistula.  She presents today stating that she feels better, right groin site also getting better.  She had only a brief sense of palpitations that lasted a few seconds earlier in the week.  No chest pain or breathlessness.  She went to the pharmacy to pick up her Eliquis however it was $400 and she did not get it filled.  We are planning to give her samples today and have her stop aspirin which she resumed.  We will check into her current insurance coverage.  Otherwise her medications are stable.  She is tolerating Lopressor.  I did review her home blood pressure checks which does not show optimal control, we will increase her valsartan to 40 mg daily.  Past Medical History:  Diagnosis Date  . Anxiety   . Arthritis   . Cholecystolithiasis   . Collagen vascular disease ([place])   . Cystocele with prolapse   . Ectopic atrial tachycardia ([place])   . Hypertension   . PAF (paroxysmal atrial  fibrillation) ([place])   . Rectocele     Past Surgical History:  Procedure Laterality Date  . ANTERIOR AND POSTERIOR REPAIR N/A 02/21/2018   Procedure: ANTERIOR (CYSTOCELE) REPAIR;  Surgeon: Bjorn Loser, MD;  Location: [place];  Service: Urology;  Laterality: N/A;  . CYSTOSCOPY N/A 02/21/2018   Procedure: CYSTOSCOPY;  Surgeon: Bjorn Loser, MD;  Location: [facility];  Service: Urology;  Laterality: N/A;  . EYE SURGERY Bilateral 2011   cataract extraction   . LAPAROSCOPIC VAGINAL HYSTERECTOMY WITH SALPINGO OOPHORECTOMY Bilateral 02/21/2018   Procedure: LAPAROSCOPIC ASSISTED VAGINAL HYSTERECTOMY WITH SALPINGO OOPHORECTOMY;  Surgeon: Maisie Fus, MD;  Location: [facility];  Service: Gynecology;  Laterality: Bilateral;  Dr. Matilde Sprang to follow  . LEFT HEART CATH AND CORONARY ANGIOGRAPHY N/A 02/07/2019   Procedure: LEFT HEART CATH AND CORONARY ANGIOGRAPHY;  Surgeon: Burnell Blanks, MD;  Location: [facility];  Service: Cardiovascular;  Laterality: N/A;    Current Outpatient Medications  Medication Sig Dispense Refill  . atorvastatin (LIPITOR) 40 MG tablet Take 20 mg by mouth on Monday, Wednesday, Friday (Patient taking differently: Take 20 mg by mouth on Monday, Wednesday, Friday in the evening) 36 tablet 3  . B-COMPLEX-C PO Take 1 tablet daily by mouth.    . Calcium Carbonate-Vitamin D (CALCARB 600/D PO) Take 1 tablet by mouth daily.     . cholecalciferol (VITAMIN D) 1000 units tablet Take 1,000 Units daily by mouth.    . COD LIVER  OIL PO Take 1 capsule by mouth 2 (two) times a week.     Marland Kitchen MAGNESIUM PO Take 1 tablet by mouth 3 (three) times a week.     . metoprolol tartrate (LOPRESSOR) 25 MG tablet Take 0.5 tablets (12.5 mg total) by mouth 2 (two) times daily. 33 tablet 11  . Multiple Vitamin (MULTIVITAMIN WITH MINERALS) TABS tablet Take 1 tablet daily by mouth.    . Multiple Vitamins-Minerals (ZINC PO) Take 1  tablet by mouth daily.     . Potassium 99 MG TABS Take 1 tablet 3 (three) times a week by mouth.    . valsartan (DIOVAN) 40 MG tablet Take 1 tablet (40 mg total) by mouth daily. 90 tablet 3  . vitamin A 10000 UNIT capsule Take 10,000 Units by mouth 2 (two) times a week.     . vitamin C (ASCORBIC ACID) 500 MG tablet Take 500 mg daily by mouth.    . vitamin E 400 UNIT capsule Take 400 Units by mouth once a week.     . zolpidem (AMBIEN) 5 MG tablet Take 2.5-5 mg by mouth at bedtime as needed for sleep.     Marland Kitchen apixaban (ELIQUIS) 5 MG TABS tablet Take 1 tablet (5 mg total) by mouth 2 (two) times daily. 84 tablet 0   No current facility-administered medications for this visit.    Allergies:  Phenobarbital, Tamiflu [oseltamivir phosphate], Depakote [valproic acid], and Lisinopril   Social History: The patient  reports that she has never smoked. She has never used smokeless tobacco. She reports that she does not drink alcohol or use drugs.   ROS:  Please see the history of present illness. Otherwise, complete review of systems is positive for none.  All other systems are reviewed and negative.   Physical Exam: VS:  BP (!) 142/88   Pulse 73   Ht 5\' 3"  (1.6 m)   Wt 126 lb (57.2 kg)   SpO2 98%   BMI 22.32 kg/m , BMI Body mass index is 22.32 kg/m.  Wt Readings from Last 3 Encounters:  03/14/19 126 lb (57.2 kg)  02/08/19 123 lb 11.2 oz (56.1 kg)  12/21/18 126 lb 6.4 oz (57.3 kg)    General: Elderly woman, appears comfortable at rest. HEENT: Conjunctiva and lids normal, wearing a mask. Neck: Supple, no elevated JVP or carotid bruits, no thyromegaly. Lungs: Clear to auscultation, nonlabored breathing at rest. Cardiac: Regular rate and rhythm, 2/6 systolic systolic murmur, no pericardial rub. Abdomen: Soft, nontender, bowel sounds present. Extremities: No pitting edema, distal pulses 2+. Skin: Warm and dry. Musculoskeletal: No kyphosis. Neuropsychiatric: Alert and oriented x3, affect grossly  appropriate.  ECG:  An ECG dated 02/08/2019 was personally reviewed today and demonstrated:  Normal sinus rhythm.  Recent Labwork: 02/06/2019: TSH 2.815 02/08/2019: BUN 10; Creatinine, Ser 0.61; Hemoglobin 11.5; Platelets 147; Potassium 3.7; Sodium 135   Other Studies Reviewed Today:  Cardiac catheterization 02/07/2019:  The left ventricular systolic function is normal.  LV end diastolic pressure is normal.  The left ventricular ejection fraction is greater than 65% by visual estimate.  There is no mitral valve regurgitation.   1. No angiographic evidence of CAD 2. Normal LV systolic function' 3. Elevation in troponin likely due to demand ischemia in setting of rapid atrial fibrillation  Recommendation: Resume heparin 8 hours post sheath pull. Will start oral anti-coagulation in the morning. No further ischemic workup.   Echocardiogram 01/16/2019:  1. Left ventricular ejection fraction, by visual estimation, is 70  to 75%. The left ventricle has hyperdynamic function. Normal left ventricular posterior wall thickness. There is no left ventricular hypertrophy.  2. Elevated mean left atrial pressure.  3. Left ventricular diastolic Doppler parameters are consistent with impaired relaxation pattern of LV diastolic filling.  4. There is focal basal septal LV hypertrophy. In the setting of focal basal septal hypertrophy and hyperdynamic LV function there is SAM of the anterior mitral valve leaflet and a dynamic LVOT gradient with a peak of 32 mmHg.  5. Global right ventricle has normal systolic function.The right ventricular size is normal. No increase in right ventricular wall thickness.  6. Left atrial size was mildly dilated.  7. Right atrial size was normal.  8. Moderate mitral annular calcification.  9. The mitral valve is abnormal. Mild mitral valve regurgitation. No evidence of mitral stenosis. 10. The tricuspid valve is normal in structure. Tricuspid valve regurgitation is mild.  11. The aortic valve is tricuspid Aortic valve regurgitation was not visualized by color flow Doppler. Structurally normal aortic valve, with no evidence of sclerosis or stenosis. 12. The pulmonic valve was not well visualized. Pulmonic valve regurgitation is not visualized by color flow Doppler. 13. Mildly elevated pulmonary artery systolic pressure. 14. PASP is borderline elevated at 30 mmHg.  Assessment and Plan:  1.  Recently documented atrial fibrillation presenting with RVR and electrically cardioverted.  We are resuming Eliquis and will check into her insurance coverage, stop aspirin.  She only missed a few days.  Otherwise continue beta-blocker.  2.  Status post type II NSTEMI with cardiac catheterization showing no significant CAD.  3.  Systolic murmur with hyperdynamic LVEF and intermittent LVOT gradient and SAM in the setting of focal basal septal hypertrophy.  Murmur is less prominent today.  4.  Essential hypertension.  We will increase valsartan to 40 mg daily for now.  Medication Adjustments/Labs and Tests Ordered: Current medicines are reviewed at length with the patient today.  Concerns regarding medicines are outlined above.   Tests Ordered: No orders of the defined types were placed in this encounter.   Medication Changes: Meds ordered this encounter  Medications  . valsartan (DIOVAN) 40 MG tablet    Sig: Take 1 tablet (40 mg total) by mouth daily.    Dispense:  90 tablet    Refill:  3    03/14/2019 dose increase  . apixaban (ELIQUIS) 5 MG TABS tablet    Sig: Take 1 tablet (5 mg total) by mouth 2 (two) times daily.    Dispense:  84 tablet    Refill:  0    03/14/2019 stop aspirin-restart eliquis Lot [id number] Exp 03/2021    Disposition:  Follow up 3 months in the [place] office.  Signed, Satira Sark, MD, [facility] 03/14/2019 3:02 PM    [facility] [place], [place], [place] [address] Phone: [phone]; Fax: [phone]

## 2019-03-14 NOTE — Patient Instructions (Addendum)
Medication Instructions:    Your physician has recommended you make the following change in your medication:  Stop aspirin  Restart eliquis 5 mg by mouth twice daily  Increase valsartan to 40 mg by mouth daily  Continue other medications the same  Labwork:  NONE  Testing/Procedures:  NONE  Follow-Up:  Your physician recommends that you schedule a follow-up appointment in: 3 months.   Any Other Special Instructions Will Be Listed Below (If Applicable).  If you need a refill on your cardiac medications before your next appointment, please call your pharmacy.

## 2019-04-01 ENCOUNTER — Telehealth: Payer: Self-pay | Admitting: Cardiology

## 2019-04-01 NOTE — Telephone Encounter (Signed)
Patient advised that samples were not available and advised to call back on Wednesday or Thursday since we were expecting to get some in this week.

## 2019-04-01 NOTE — Telephone Encounter (Signed)
Asking for enough supply of Eliquis to get her through the rest of the year

## 2019-04-11 DIAGNOSIS — E349 Endocrine disorder, unspecified: Secondary | ICD-10-CM | POA: Diagnosis not present

## 2019-05-09 ENCOUNTER — Other Ambulatory Visit: Payer: Self-pay | Admitting: Cardiology

## 2019-05-09 MED ORDER — METOPROLOL TARTRATE 25 MG PO TABS: 12.5000 mg | ORAL_TABLET | Freq: Two times a day (BID) | ORAL | 6 refills | Status: DC

## 2019-05-09 NOTE — Telephone Encounter (Signed)
Done

## 2019-05-09 NOTE — Telephone Encounter (Signed)
°. ° ° °  1. Which medications need to be refilled? (please list name of each medication and dose if known) metoprolol tartrate (LOPRESSOR) 25 MG tablet    2. Which pharmacy/location (including street and city if local pharmacy) is medication to be sent to?    [facility]   3. Do they need a 30 day or 90 day supply? NO

## 2019-05-24 DIAGNOSIS — I10 Essential (primary) hypertension: Secondary | ICD-10-CM | POA: Diagnosis not present

## 2019-05-24 DIAGNOSIS — E7849 Other hyperlipidemia: Secondary | ICD-10-CM | POA: Diagnosis not present

## 2019-06-21 ENCOUNTER — Ambulatory Visit: Payer: Medicare Other | Admitting: Cardiology

## 2019-06-21 DIAGNOSIS — I480 Paroxysmal atrial fibrillation: Secondary | ICD-10-CM | POA: Diagnosis not present

## 2019-06-21 DIAGNOSIS — I10 Essential (primary) hypertension: Secondary | ICD-10-CM | POA: Diagnosis not present

## 2019-06-21 DIAGNOSIS — E7849 Other hyperlipidemia: Secondary | ICD-10-CM | POA: Diagnosis not present

## 2019-07-03 ENCOUNTER — Encounter (HOSPITAL_COMMUNITY): Payer: Self-pay | Admitting: Emergency Medicine

## 2019-07-03 ENCOUNTER — Emergency Department (HOSPITAL_COMMUNITY)
Admission: EM | Admit: 2019-07-03 | Discharge: 2019-07-03 | Disposition: A | Discharge status: Home / Self Care | Payer: Medicare Other | Attending: Emergency Medicine | Admitting: Emergency Medicine

## 2019-07-03 ENCOUNTER — Other Ambulatory Visit: Payer: Self-pay

## 2019-07-03 ENCOUNTER — Emergency Department (HOSPITAL_COMMUNITY): Payer: Medicare Other

## 2019-07-03 DIAGNOSIS — Z79899 Other long term (current) drug therapy: Secondary | ICD-10-CM | POA: Diagnosis not present

## 2019-07-03 DIAGNOSIS — Z7901 Long term (current) use of anticoagulants: Secondary | ICD-10-CM | POA: Diagnosis not present

## 2019-07-03 DIAGNOSIS — I252 Old myocardial infarction: Secondary | ICD-10-CM | POA: Insufficient documentation

## 2019-07-03 DIAGNOSIS — I4891 Unspecified atrial fibrillation: Secondary | ICD-10-CM | POA: Insufficient documentation

## 2019-07-03 DIAGNOSIS — I959 Hypotension, unspecified: Secondary | ICD-10-CM | POA: Diagnosis not present

## 2019-07-03 DIAGNOSIS — I10 Essential (primary) hypertension: Secondary | ICD-10-CM | POA: Diagnosis not present

## 2019-07-03 DIAGNOSIS — R000 Tachycardia, unspecified: Secondary | ICD-10-CM | POA: Diagnosis not present

## 2019-07-03 LAB — CBC
HCT: 42.3 % (ref 36.0–46.0)
Hemoglobin: 14.1 g/dL (ref 12.0–15.0)
MCH: 32 pg (ref 26.0–34.0)
MCHC: 33.3 g/dL (ref 30.0–36.0)
MCV: 96.1 fL (ref 80.0–100.0)
Platelets: 235 10*3/uL (ref 150–400)
RBC: 4.4 MIL/uL (ref 3.87–5.11)
RDW: 13.2 % (ref 11.5–15.5)
WBC: 6.5 10*3/uL (ref 4.0–10.5)
nRBC: 0 % (ref 0.0–0.2)

## 2019-07-03 LAB — MAGNESIUM: Magnesium: 2 mg/dL (ref 1.7–2.4)

## 2019-07-03 LAB — BASIC METABOLIC PANEL
Anion gap: 11 (ref 5–15)
BUN: 14 mg/dL (ref 8–23)
CO2: 25 mmol/L (ref 22–32)
Calcium: 9.5 mg/dL (ref 8.9–10.3)
Chloride: 103 mmol/L (ref 98–111)
Creatinine, Ser: 0.76 mg/dL (ref 0.44–1.00)
GFR calc Af Amer: >60 mL/min (ref >60)
GFR calc non Af Amer: >60 mL/min (ref >60)
Glucose, Bld: 122 mg/dL — ABNORMAL HIGH (ref 70–99)
Potassium: 3.9 mmol/L (ref 3.5–5.1)
Sodium: 139 mmol/L (ref 135–145)

## 2019-07-03 MED ORDER — PROPOFOL 10 MG/ML IV BOLUS
INTRAVENOUS | Status: AC | PRN
  Administered 2019-07-03: 28.35 mg via INTRAVENOUS

## 2019-07-03 MED ORDER — PROPOFOL 10 MG/ML IV BOLUS
0.5000 mg/kg | Freq: Once | INTRAVENOUS | Status: AC
  Administered 2019-07-03: 10:00:00 28.4 mg via INTRAVENOUS
  Filled 2019-07-03: qty 20

## 2019-07-03 MED ORDER — SODIUM CHLORIDE 0.9 % IV BOLUS
500.0000 mL | Freq: Once | INTRAVENOUS | Status: AC
  Administered 2019-07-03: 500 mL via INTRAVENOUS

## 2019-07-03 NOTE — ED Provider Notes (Signed)
[facility] EMERGENCY DEPARTMENT Provider Note   CSN: [id number] Arrival date & time: 07/03/19  0756     History Chief Complaint  Patient presents with  . Palpitations    Kristen Mckenzie is a 79 y.o. female.  She has a history of paroxysmal A. fib and is on Eliquis.  She woke up this morning with feeling her heart racing and had some hot flashes.  She denies shortness of breath or chest tightness.  No recent illness.  No fevers chills cough abdominal pain vomiting diarrhea urinary symptoms.  She said she has been taking her medications regularly.  She took a full tablet of her Diovan this morning instead of a half a tablet that she normally takes.  Has not taken her other medications this morning.  The history is provided by the patient.  Palpitations Palpitations quality:  Fast Onset quality:  Unable to specify Timing:  Constant Progression:  Unchanged Chronicity:  Recurrent Relieved by:  None tried Worsened by:  Nothing Ineffective treatments:  None tried Associated symptoms: no chest pain, no cough, no dizziness, no lower extremity edema, no nausea, no shortness of breath and no vomiting   Risk factors: hx of atrial fibrillation        Past Medical History:  Diagnosis Date  . Anxiety   . Arthritis   . Cholecystolithiasis   . Collagen vascular disease ([place])   . Cystocele with prolapse   . Ectopic atrial tachycardia ([place])   . Hypertension   . PAF (paroxysmal atrial fibrillation) ([place])   . Rectocele     Patient Active Problem List   Diagnosis Date Noted  . Non-ST elevation (NSTEMI) myocardial infarction ([place]) 02/07/2019  . Atrial fibrillation with RVR ([place]) 02/06/2019  . 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 Tachycardia 02/28/2017  . Essential hypertension 02/28/2017  . Acute hyperglycemia 02/28/2017  . 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 ANTERIOR AND POSTERIOR REPAIR N/A  02/21/2018   Procedure: ANTERIOR (CYSTOCELE) REPAIR;  Surgeon: Bjorn Loser, MD;  Location: [facility];  Service: Urology;  Laterality: N/A;  . CYSTOSCOPY N/A 02/21/2018   Procedure: CYSTOSCOPY;  Surgeon: Bjorn Loser, MD;  Location: [facility];  Service: Urology;  Laterality: N/A;  . EYE SURGERY Bilateral 2011   cataract extraction   . LAPAROSCOPIC VAGINAL HYSTERECTOMY WITH SALPINGO OOPHORECTOMY Bilateral 02/21/2018   Procedure: LAPAROSCOPIC ASSISTED VAGINAL HYSTERECTOMY WITH SALPINGO OOPHORECTOMY;  Surgeon: Maisie Fus, MD;  Location[facility];  Service: Gynecology;  Laterality: Bilateral;  Dr. Matilde Sprang to follow  . LEFT HEART CATH AND CORONARY ANGIOGRAPHY N/A 02/07/2019   Procedure: LEFT HEART CATH AND CORONARY ANGIOGRAPHY;  Surgeon: Burnell Blanks, MD;  Location: [facility];  Service: Cardiovascular;  Laterality: N/A;     OB History   No obstetric history on file.     Family History  Problem Relation Age of Onset  . Hypertension Father     Social History   Tobacco Use  . Smoking status: Never Smoker  . Smokeless tobacco: Never Used  Substance Use Topics  . Alcohol use: No  . Drug use: No    Home Medications Prior to Admission medications   Medication Sig Start Date End Date Taking? Authorizing Provider  apixaban (ELIQUIS) 5 MG TABS tablet Take 1 tablet (5 mg total) by mouth 2 (two) times daily. 03/14/19   Satira Sark, MD  atorvastatin (LIPITOR)  40 MG tablet Take 20 mg by mouth on Monday, Wednesday, Friday Patient taking differently: Take 20 mg by mouth on Monday, Wednesday, Friday in the evening 12/21/18   Satira Sark, MD  B-COMPLEX-C PO Take 1 tablet daily by mouth.    [provider]  Calcium Carbonate-Vitamin D (CALCARB 600/D PO) Take 1 tablet by mouth daily.     [provider]  cholecalciferol (VITAMIN D) 1000 units tablet Take 1,000 Units daily by mouth.     [provider]  COD LIVER OIL PO Take 1 capsule by mouth 2 (two) times a week.     [provider]  MAGNESIUM PO Take 1 tablet by mouth 3 (three) times a week.     [provider]  metoprolol tartrate (LOPRESSOR) 25 MG tablet Take 0.5 tablets (12.5 mg total) by mouth 2 (two) times daily. 05/09/19   Satira Sark, MD  Multiple Vitamin (MULTIVITAMIN WITH MINERALS) TABS tablet Take 1 tablet daily by mouth.    [provider]  Multiple Vitamins-Minerals (ZINC PO) Take 1 tablet by mouth daily.     [provider]  Potassium 99 MG TABS Take 1 tablet 3 (three) times a week by mouth.    [provider]  valsartan (DIOVAN) 40 MG tablet Take 1 tablet (40 mg total) by mouth daily. 03/14/19   Satira Sark, MD  vitamin A 10000 UNIT capsule Take 10,000 Units by mouth 2 (two) times a week.     [provider]  vitamin C (ASCORBIC ACID) 500 MG tablet Take 500 mg daily by mouth.    [provider]  vitamin E 400 UNIT capsule Take 400 Units by mouth once a week.     [provider]  zolpidem (AMBIEN) 5 MG tablet Take 2.5-5 mg by mouth at bedtime as needed for sleep.     [provider]    Allergies    Phenobarbital, Tamiflu [oseltamivir phosphate], Depakote [valproic acid], and Lisinopril  Review of Systems   Review of Systems  Constitutional: Negative for fever.  HENT: Negative for sore throat.   Eyes: Negative for visual disturbance.  Respiratory: Negative for cough and shortness of breath.   Cardiovascular: Positive for palpitations. Negative for chest pain.  Gastrointestinal: Negative for abdominal pain, nausea and vomiting.  Genitourinary: Negative for dysuria.  Musculoskeletal: Negative for neck pain.  Skin: Negative for rash.  Neurological: Negative for dizziness.    Physical Exam Updated Vital Signs BP 97/77 (BP Location: Left Arm)   Pulse (!) 150   Temp 97.7 F (36.5 C) (Oral)   Resp (!)  29   Ht 5\' 3"  (1.6 m)   Wt 56.7 kg   BMI 22.14 kg/m   Physical Exam Vitals and nursing note reviewed.  Constitutional:      General: She is not in acute distress.    Appearance: She is well-developed.  HENT:     Head: Normocephalic and atraumatic.  Eyes:     Conjunctiva/sclera: Conjunctivae normal.  Cardiovascular:     Rate and Rhythm: Tachycardia present. Rhythm irregular.     Pulses: Normal pulses.     Heart sounds: No murmur.  Pulmonary:     Effort: Pulmonary effort is normal. No respiratory distress.     Breath sounds: Normal breath sounds.  Abdominal:     Palpations: Abdomen is soft.     Tenderness: There is no abdominal tenderness.  Musculoskeletal:        General: Normal range  of motion.     Cervical back: Neck supple.     Right lower leg: No edema.     Left lower leg: No edema.  Skin:    General: Skin is warm and dry.     Capillary Refill: Capillary refill takes less than 2 seconds.  Neurological:     General: No focal deficit present.     Mental Status: She is alert.     ED Results / Procedures / Treatments   Labs (all labs ordered are listed, but only abnormal results are displayed) Labs Reviewed  BASIC METABOLIC PANEL - Abnormal; Notable for the following components:      Result Value   Glucose, Bld 122 (*)    All other components within normal limits  MAGNESIUM  CBC    EKG EKG Interpretation  Date/Time:  Wednesday July 03 2019 08:11:11 EST Ventricular Rate:  178 PR Interval:    QRS Duration: 86 QT Interval:  281 QTC Calculation: 484 R Axis:   56 Text Interpretation: Atrial fibrillation with rapid V-rate Repolarization abnormality, prob rate related afib replacing sinus prior 10/20 Confirmed by Aletta Edouard [phone]) on 07/03/2019 8:15:10 AM   Radiology DG Chest Port 1 View  Result Date: 07/03/2019 CLINICAL DATA:  Tachycardia EXAM: PORTABLE CHEST 1 VIEW COMPARISON:  02/06/2019 chest radiograph. FINDINGS: Stable cardiomediastinal  silhouette with normal heart size. No pneumothorax. No pleural effusion. Lungs appear clear, with no acute consolidative airspace disease and no pulmonary edema. IMPRESSION: No active disease. Electronically Signed   By: Ilona Sorrel M.D.   On: 07/03/2019 09:05    Procedures .Sedation  Date/Time: 07/03/2019 9:16 AM Performed by: Hayden Rasmussen, MD Authorized by: Hayden Rasmussen, MD   Consent:    Consent obtained:  Verbal and written   Consent given by:  Patient   Risks discussed:  Allergic reaction, dysrhythmia, inadequate sedation, nausea, prolonged hypoxia resulting in organ damage, prolonged sedation necessitating reversal, respiratory compromise necessitating ventilatory assistance and intubation and vomiting   Alternatives discussed:  Analgesia without sedation, anxiolysis and regional anesthesia Universal protocol:    Procedure explained and questions answered to patient or proxy's satisfaction: yes     Relevant documents present and verified: yes     Test results available and properly labeled: yes     Imaging studies available: yes     Required blood products, implants, devices, and special equipment available: yes     Site/side marked: yes     Immediately prior to procedure a time out was called: yes     Patient identity confirmation method:  Verbally with patient Indications:    Procedure performed:  Cardioversion   Procedure necessitating sedation performed by:  Physician performing sedation Pre-sedation assessment:    Time since last food or drink:  12   ASA classification: class 2 - patient with mild systemic disease     Neck mobility: normal     Mouth opening:  3 or more finger widths   Thyromental distance:  4 finger widths   Mallampati score:  II - soft palate, uvula, fauces visible   Pre-sedation assessments completed and reviewed: airway patency, cardiovascular function, hydration status, mental status, nausea/vomiting, pain level, respiratory function and  temperature     Pre-sedation assessment completed:  07/03/2019 9:10 AM Immediate pre-procedure details:    Reassessment: Patient reassessed immediately prior to procedure     Reviewed: vital signs, relevant labs/tests and NPO status     Verified: bag valve mask available, emergency equipment available,  intubation equipment available, IV patency confirmed, oxygen available and suction available   Procedure details (see MAR for exact dosages):    Preoxygenation:  Nasal cannula   Sedation:  Propofol   Intended level of sedation: deep   Intra-procedure monitoring:  Blood pressure monitoring, cardiac monitor, continuous pulse oximetry, frequent LOC assessments, frequent vital sign checks and continuous capnometry   Intra-procedure events: none     Total Provider sedation time (minutes):  10 Post-procedure details:    Post-sedation assessment completed:  07/03/2019 10:10 AM   Attendance: Constant attendance by certified staff until patient recovered     Recovery: Patient returned to pre-procedure baseline     Post-sedation assessments completed and reviewed: airway patency, cardiovascular function, hydration status, mental status, nausea/vomiting, pain level, respiratory function and temperature     Patient is stable for discharge or admission: yes     Patient tolerance:  Tolerated well, no immediate complications .Cardioversion  Date/Time: 07/03/2019 9:17 AM Performed by: Hayden Rasmussen, MD Authorized by: Hayden Rasmussen, MD   Consent:    Consent obtained:  Written and verbal   Consent given by:  Patient   Risks discussed:  Cutaneous burn, death, induced arrhythmia and pain   Alternatives discussed:  Rate-control medication, delayed treatment and no treatment Pre-procedure details:    Cardioversion basis:  Emergent   Rhythm:  Atrial fibrillation   Electrode placement:  Anterior-posterior Patient sedated: Yes. Refer to sedation procedure documentation for details of  sedation.  Attempt one:    Cardioversion mode:  Synchronous   Shock (joules) attempt one: 120.   Shock outcome:  Conversion to normal sinus rhythm Post-procedure details:    Patient status:  Awake   Patient tolerance of procedure:  Tolerated well, no immediate complications .Critical Care Performed by: Hayden Rasmussen, MD Authorized by: Hayden Rasmussen, MD   Critical care provider statement:    Critical care time (minutes):  45   Critical care time was exclusive of:  Separately billable procedures and treating other patients   Critical care was necessary to treat or prevent imminent or life-threatening deterioration of the following conditions:  Cardiac failure   Critical care was time spent personally by me on the following activities:  Discussions with consultants, evaluation of patient's response to treatment, examination of patient, ordering and performing treatments and interventions, ordering and review of laboratory studies, ordering and review of radiographic studies, pulse oximetry, re-evaluation of patient's condition, obtaining history from patient or surrogate, review of old charts and development of treatment plan with patient or surrogate   I assumed direction of critical care for this patient from another provider in my specialty: no     (including critical care time)  Medications Ordered in ED Medications  sodium chloride 0.9 % bolus 500 mL (0 mLs Intravenous Stopped 07/03/19 0900)  propofol (DIPRIVAN) 10 mg/mL bolus/IV push 28.4 mg (28.4 mg Intravenous Given 07/03/19 0935)  propofol (DIPRIVAN) 10 mg/mL bolus/IV push (28.35 mg Intravenous Given 07/03/19 0935)    ED Course  I have reviewed the triage vital signs and the nursing notes.  Pertinent labs & imaging results that were available during my care of the patient were reviewed by me and considered in my medical decision making (see chart for details).  Clinical Course as of Jul 02 1901  Wed Jul 03, 2019  0820  Differential includes A. fib, SVT, anemia, metabolic derangement, pneumonia, PE   [MB]  0838 Chest x-ray interpreted by me as no pneumothorax, plump right  hilum seen on prior chest x-ray.  Awaiting radiology reading.   [MB]  [phone] Patient took an extra dose of Diovan prior to coming.  Blood pressure low at 97/77.  Minimally symptomatic so giving her some IV fluids to see if we get some more blood pressure to work with so we can try rate control.  May need cardioversion.  She has been n.p.o. since last night.   [MB]  [id number] Discussed with Dr. Domenic Polite from cardiology.  He knows this patient well and he does agree that cardioversion is probably the most prudent approach with her low blood pressure.    [MB]  [id number] I reviewed the risks and benefits of both procedural sedation and cardioversion with the patient and she is agreeable to procedures.   [MB]  [id number] Successful cardioversion with 120 J synchronized.  Patient's blood pressure little soft so getting some IV fluids.  She is awake and alert.  Likely will able to be discharged if she is not symptomatically dizzy on her feet.   [MB]  [id number] Patient ambulated here without any difficulty.  Will discharge   [MB]    Clinical Course User Index [MB] Hayden Rasmussen, MD   MDM Rules/Calculators/A&P                     CHA2DS2/VAS Stroke Risk Points  Current as of 26 minutes ago     4 >= 2 Points: High Risk  1 - 1.99 Points: Medium Risk  0 Points: Low Risk    [name] score has not changed in the past year.: No Change     Details    This score determines the patient's risk of having a stroke if the  patient has atrial fibrillation.       Points Metrics  0 Has Congestive Heart Failure:  No    Current as of 26 minutes ago  0 Has Vascular Disease:  No    Current as of 26 minutes ago  1 Has Hypertension:  Yes    Current as of 26 minutes ago  2 Age:  85    Current as of 26 minutes ago  0 Has Diabetes:  No    Current as of 26 minutes ago  0  Had Stroke:  No  Had TIA:  No  Had thromboembolism:  No    Current as of 26 minutes ago  1 Female:  Yes    Current as of 26 minutes ago           Final Clinical Impression(s) / ED Diagnoses Final diagnoses:  Atrial fibrillation with RVR (HCC)  Hypotension, unspecified hypotension type    Rx / DC Orders ED Discharge Orders    None       Hayden Rasmussen, MD 07/03/19 [phone]

## 2019-07-03 NOTE — ED Notes (Signed)
Respiratory paged spoke with Abigail Butts will be down shortly.

## 2019-07-03 NOTE — Sedation Documentation (Signed)
Sedation administered at the start of syncrhonized cardioversion at 935. Shock delivered at 936. Pt rhythm noted to be NSR. Pt tolerated well. EDP, RT at bedside during length of procedure.

## 2019-07-03 NOTE — ED Notes (Signed)
930- verbal order for 166ml NS bolus. NS bolus administered prior to cardioversion  937-VS collected, EKG completed, exported, and given to EDP.   938-Pt groggy, alert, airway patent.   940- pt alert, talking with ED staff and able to hold conversation. Airway patent.

## 2019-07-03 NOTE — ED Triage Notes (Signed)
Pt reports sudden onset shortness of breath, palpitations, intermittent chest tightness this am after going to the bathroom. Pt reports history of same. Pt denies any pain at this time. EDP at bedside at time of patient entering room.

## 2019-07-03 NOTE — Discharge Instructions (Addendum)
You were seen in the emergency department for rapid heart rate.  You were in atrial fibrillation.  Your blood work did not show any significant abnormalities.  You underwent a sedation and a cardioversion to put you back into normal sinus rhythm again.  You tolerated that very well.  Please continue your regular medications and follow-up with Dr. Domenic Polite.  Return to the emergency department for any worsening symptoms.

## 2019-07-03 NOTE — ED Notes (Signed)
Pt ambulated approximately 40 ft out of room on cardiac monitoring. vss stable. Pt denies chest pain, shortness of breath.

## 2019-07-03 NOTE — Progress Notes (Signed)
**Note De-Identified Sakshi Sermons Obfuscation** RT at bedside for cardioversion; Patient tolerated well. RRT to continue to monitor

## 2019-07-04 ENCOUNTER — Telehealth: Payer: Self-pay | Admitting: Cardiology

## 2019-07-04 NOTE — Telephone Encounter (Signed)
Pt c/o slight headache with BP 196/78 HR 90 at 230pm 335pm 177/74 HR 85 - taking metoprolol 12.5 mg bid and taking valsartan 40 mg daily but says she has only been taking 20 mg daily - pt aware that valsartan was increased in November of 2020 to 40 mg daily - pt will take 20 mg this afternoon (had already taken 20 mg this morning) and start 40 mg daily tomorrow and monitor BP for the next few days and update [place] next week on BP

## 2019-07-04 NOTE — Telephone Encounter (Signed)
Just took BP and it was 196/78  HR 90 Wants to know what she should do about it being so high  She is not sure how accurate her BP machine is

## 2019-07-08 ENCOUNTER — Telehealth: Payer: Self-pay | Admitting: Cardiology

## 2019-07-08 DIAGNOSIS — J329 Chronic sinusitis, unspecified: Secondary | ICD-10-CM | POA: Diagnosis not present

## 2019-07-08 NOTE — Telephone Encounter (Signed)
Pt notified and voiced understanding 

## 2019-07-08 NOTE — Telephone Encounter (Signed)
Spoke with pt who states that BP has been elevated since around March 1. Current BP are as follows 152/70, 162/ 82 HR 68, and 172/86 HR 83. Pt states she feels pounding in her head from her HR at times. She denies having a headache. And states " I just don't feel right". Pt has appt with Dr. Domenic Polite on tomorrow at 1:20. Please advise.

## 2019-07-08 NOTE — Telephone Encounter (Signed)
Since we already have an office visit scheduled for tomorrow, we can discuss her medications and symptoms then and make a more definitive plan.

## 2019-07-08 NOTE — Telephone Encounter (Signed)
Still having issues with her BP running high 162-180

## 2019-07-09 ENCOUNTER — Other Ambulatory Visit: Payer: Self-pay | Admitting: *Deleted

## 2019-07-09 ENCOUNTER — Encounter: Payer: Self-pay | Admitting: Cardiology

## 2019-07-09 ENCOUNTER — Ambulatory Visit (INDEPENDENT_AMBULATORY_CARE_PROVIDER_SITE_OTHER): Payer: Medicare Other | Admitting: Cardiology

## 2019-07-09 ENCOUNTER — Other Ambulatory Visit: Payer: Self-pay

## 2019-07-09 VITALS — BP 128/64 | HR 74 | Ht 63.0 in | Wt 128.0 lb

## 2019-07-09 DIAGNOSIS — I480 Paroxysmal atrial fibrillation: Secondary | ICD-10-CM

## 2019-07-09 DIAGNOSIS — I10 Essential (primary) hypertension: Secondary | ICD-10-CM

## 2019-07-09 MED ORDER — APIXABAN 5 MG PO TABS: 5.0000 mg | ORAL_TABLET | Freq: Two times a day (BID) | ORAL | 0 refills | Status: DC

## 2019-07-09 MED ORDER — FLECAINIDE ACETATE 50 MG PO TABS: 50.0000 mg | ORAL_TABLET | Freq: Two times a day (BID) | ORAL | 3 refills | Status: DC

## 2019-07-09 NOTE — Patient Instructions (Addendum)
Medication Instructions:    Your physician has recommended you make the following change in your medication:   Start flecainide 50 mg by mouth twice daily  Continue other medications the same  Labwork:  NONE  Testing/Procedures:  NONE  Follow-Up:  Your physician recommends that you schedule a follow-up appointment in: 3 months (office).  Your physician recommends that you schedule a follow-up appointment in: 2 weeks with the nurse for an EKG.  Any Other Special Instructions Will Be Listed Below (If Applicable).  If you need a refill on your cardiac medications before your next appointment, please call your pharmacy.

## 2019-07-09 NOTE — Progress Notes (Signed)
Cardiology Office Note  Date: 07/09/2019   ID: Shaelynne, Tsutsui 1940/12/13, MRN [id number]  PCP:  Caryl Bis, MD  Cardiologist:  Rozann Lesches, MD Electrophysiologist:  None   Chief Complaint  Patient presents with  . Cardiac follow-up    History of Present Illness: Kristen Mckenzie is a 79 y.o. female last seen in November 2020.  I reviewed interval records.  She was seen in the ER at [facility] on March 10 with sudden onset palpitations.  She was found to be in rapid atrial fibrillation and ultimately underwent successful cardioversion in the ER.  Presenting ECG from March 10 showed rapid, course atrial fibrillation with diffuse ST segment abnormalities.  Cardioversion showed sinus rhythm with resolution of ST segment changes.  She presents for a follow-up visit today, states that she feels much better in general.  Her blood pressure has been elevated, but she had only been taking Diovan 20 mg daily, just increased this within the last few days and her blood pressure looks better.  I personally reviewed her ECG which shows sinus rhythm.  Today we discussed addition of antiarrhythmic therapy to hopefully suppress recurring atrial fibrillation and help her to feel better. Recent catheterization from October of last year showed no angiographic evidence of CAD and she has normal LVEF.  Plan low-dose flecainide.  Past Medical History:  Diagnosis Date  . Anxiety   . Arthritis   . Cholecystolithiasis   . Collagen vascular disease ([place])   . Cystocele with prolapse   . Ectopic atrial tachycardia ([place])   . Hypertension   . PAF (paroxysmal atrial fibrillation) ([place])   . Rectocele     Past Surgical History:  Procedure Laterality Date  . ANTERIOR AND POSTERIOR REPAIR N/A 02/21/2018   Procedure: ANTERIOR (CYSTOCELE) REPAIR;  Surgeon: Bjorn Loser, MD;  Location: [facility];  Service: Urology;  Laterality: N/A;  . CYSTOSCOPY N/A 02/21/2018   Procedure:  CYSTOSCOPY;  Surgeon: Bjorn Loser, MD;  Location: [facility];  Service: Urology;  Laterality: N/A;  . EYE SURGERY Bilateral 2011   cataract extraction   . LAPAROSCOPIC VAGINAL HYSTERECTOMY WITH SALPINGO OOPHORECTOMY Bilateral 02/21/2018   Procedure: LAPAROSCOPIC ASSISTED VAGINAL HYSTERECTOMY WITH SALPINGO OOPHORECTOMY;  Surgeon: Maisie Fus, MD;  Location: [facility];  Service: Gynecology;  Laterality: Bilateral;  Dr. Matilde Sprang to follow  . LEFT HEART CATH AND CORONARY ANGIOGRAPHY N/A 02/07/2019   Procedure: LEFT HEART CATH AND CORONARY ANGIOGRAPHY;  Surgeon: Burnell Blanks, MD;  Location: [place] [facility];  Service: Cardiovascular;  Laterality: N/A;    Current Outpatient Medications  Medication Sig Dispense Refill  . amoxicillin (AMOXIL) 500 MG tablet Take 500 mg by mouth 2 (two) times daily.    Marland Kitchen apixaban (ELIQUIS) 5 MG TABS tablet Take 1 tablet (5 mg total) by mouth 2 (two) times daily. 84 tablet 0  . atorvastatin (LIPITOR) 40 MG tablet Take 20 mg by mouth daily.    . Calcium Carbonate-Vitamin D (CALCARB 600/D PO) Take 1 tablet by mouth daily.     . cholecalciferol (VITAMIN D) 1000 units tablet Take 1,000 Units daily by mouth.    . COD LIVER OIL PO Take 1 capsule by mouth 2 (two) times a week.     Marland Kitchen MAGNESIUM PO Take 1 tablet by mouth 3 (three) times a week.     . metoprolol tartrate (LOPRESSOR) 25 MG tablet Take 0.5 tablets (12.5 mg total) by mouth 2 (two)  times daily. 30 tablet 6  . Multiple Vitamin (MULTIVITAMIN WITH MINERALS) TABS tablet Take 1 tablet daily by mouth.    . Multiple Vitamins-Minerals (ZINC PO) Take 1 tablet by mouth daily.     . Potassium 99 MG TABS Take 1 tablet 3 (three) times a week by mouth.    . valsartan (DIOVAN) 40 MG tablet Take 1 tablet (40 mg total) by mouth daily. 90 tablet 3  . vitamin A 10000 UNIT capsule Take 10,000 Units by mouth 2 (two) times a week.     . vitamin C (ASCORBIC ACID) 500 MG tablet Take  500 mg daily by mouth.    . vitamin E 400 UNIT capsule Take 400 Units by mouth once a week.     . zolpidem (AMBIEN) 5 MG tablet Take 2.5-5 mg by mouth at bedtime as needed for sleep.     . flecainide (TAMBOCOR) 50 MG tablet Take 1 tablet (50 mg total) by mouth 2 (two) times daily. 60 tablet 3   No current facility-administered medications for this visit.   Allergies:  Phenobarbital, Tamiflu [oseltamivir phosphate], Depakote [valproic acid], and Lisinopril   ROS:   No dizziness or syncope.  Insomnia.  Physical Exam: VS:  BP 128/64   Pulse 74   Ht 5\' 3"  (1.6 m)   Wt 128 lb (58.1 kg)   SpO2 98%   BMI 22.67 kg/m , BMI Body mass index is 22.67 kg/m.  Wt Readings from Last 3 Encounters:  07/09/19 128 lb (58.1 kg)  07/03/19 125 lb (56.7 kg)  03/14/19 126 lb (57.2 kg)    General: Elderly woman, appears comfortable at rest. HEENT: Conjunctiva and lids normal, wearing a mask. Neck: Supple, no elevated JVP or carotid bruits, no thyromegaly. Lungs: Clear to auscultation, nonlabored breathing at rest. Cardiac: Regular rate and rhythm, no S3, 2/6 systolic murmur. Abdomen: Soft, nontender, bowel sounds present. Extremities: No pitting edema, distal pulses 2+.  ECG:  An ECG dated 07/03/2019 was personally reviewed today and demonstrated:  Sinus rhythm with borderline low voltage.  Recent Labwork: 02/06/2019: TSH 2.815 07/03/2019: BUN 14; Creatinine, Ser 0.76; Hemoglobin 14.1; Magnesium 2.0; Platelets 235; Potassium 3.9; Sodium 139     Component Value Date/Time   CHOL 222 (H) 03/01/2017 0458   TRIG 109 03/01/2017 0458   HDL 47 03/01/2017 0458   CHOLHDL 4.7 03/01/2017 0458   VLDL 22 03/01/2017 0458   LDLCALC 153 (H) 03/01/2017 0458    Other Studies Reviewed Today:  Chest x-ray 07/03/2019: FINDINGS: Stable cardiomediastinal silhouette with normal heart size. No pneumothorax. No pleural effusion. Lungs appear clear, with no acute consolidative airspace disease and no pulmonary  edema.  IMPRESSION: No active disease.  Cardiac catheterization 02/07/2019:  The left ventricular systolic function is normal.  LV end diastolic pressure is normal.  The left ventricular ejection fraction is greater than 65% by visual estimate.  There is no mitral valve regurgitation.   1. No angiographic evidence of CAD 2. Normal LV systolic function' 3. Elevation in troponin likely due to demand ischemia in setting of rapid atrial fibrillation  Echocardiogram 01/16/2019: 1. Left ventricular ejection fraction, by visual estimation, is 70 to  75%. The left ventricle has hyperdynamic function. Normal left ventricular  posterior wall thickness. There is no left ventricular hypertrophy.  2. Elevated mean left atrial pressure.  3. Left ventricular diastolic Doppler parameters are consistent with  impaired relaxation pattern of LV diastolic filling.  4. There is focal basal septal LV hypertrophy. In the  setting of focal  basal septal hypertrophy and hyperdynamic LV function there is SAM of the  anterior mitral valve leaflet and a dynamic LVOT gradient with a peak of  32 mmHg.  5. Global right ventricle has normal systolic function.The right  ventricular size is normal. No increase in right ventricular wall  thickness.  6. Left atrial size was mildly dilated.  7. Right atrial size was normal.  8. Moderate mitral annular calcification.  9. The mitral valve is abnormal. Mild mitral valve regurgitation. No  evidence of mitral stenosis.  10. The tricuspid valve is normal in structure. Tricuspid valve  regurgitation is mild.  11. The aortic valve is tricuspid Aortic valve regurgitation was not  visualized by color flow Doppler. Structurally normal aortic valve, with  no evidence of sclerosis or stenosis.  12. The pulmonic valve was not well visualized. Pulmonic valve  regurgitation is not visualized by color flow Doppler.  13. Mildly elevated pulmonary artery systolic  pressure.  14. PASP is borderline elevated at 30 mmHg.   Assessment and Plan:  1.  Paroxysmal atrial fibrillation with CHA2DS2-VASc or 4.  She is status post recent cardioversion in the ER, remains in sinus rhythm.  Plan is to continue Eliquis and Toprol-XL.  We will add flecainide 50 mg twice daily for rhythm suppression.  Follow-up with ECG in 2 weeks.  Clinical visit thereafter in 3 months.  2.  History of normal LVEF and no significant CAD by cardiac catheterization in October 2020.  3.  Essential hypertension, agree with increasing valsartan back to 40 mg which she has done recently.  Today's blood pressure is adequately controlled.  Medication Adjustments/Labs and Tests Ordered: Current medicines are reviewed at length with the patient today.  Concerns regarding medicines are outlined above.   Tests Ordered: Orders Placed This Encounter  Procedures  . EKG 12-Lead    Medication Changes: Meds ordered this encounter  Medications  . flecainide (TAMBOCOR) 50 MG tablet    Sig: Take 1 tablet (50 mg total) by mouth 2 (two) times daily.    Dispense:  60 tablet    Refill:  3    07/09/2019 NEW    Disposition:  Follow up 2 weeks for ECG and then 46-month [place] office visit.  Signed,  Satira Sark, MD, [facility] 07/09/2019 1:48 PM    [employer] [facility] [address], [place], [place] [address] Phone: [phone]; Fax: [phone]

## 2019-07-12 ENCOUNTER — Other Ambulatory Visit: Payer: Self-pay

## 2019-07-12 ENCOUNTER — Other Ambulatory Visit: Payer: Self-pay | Admitting: *Deleted

## 2019-07-12 MED ORDER — VALSARTAN 40 MG PO TABS: 40.0000 mg | ORAL_TABLET | Freq: Every day | ORAL | 3 refills | Status: DC

## 2019-07-12 MED ORDER — APIXABAN 5 MG PO TABS: 5.0000 mg | ORAL_TABLET | Freq: Two times a day (BID) | ORAL | 2 refills | Status: DC

## 2019-07-12 MED ORDER — METOPROLOL TARTRATE 25 MG PO TABS: 12.5000 mg | ORAL_TABLET | Freq: Two times a day (BID) | ORAL | 3 refills | Status: DC

## 2019-07-12 MED ORDER — FLECAINIDE ACETATE 50 MG PO TABS: 50.0000 mg | ORAL_TABLET | Freq: Two times a day (BID) | ORAL | 3 refills | Status: DC

## 2019-07-12 MED ORDER — ATORVASTATIN CALCIUM 40 MG PO TABS: 40.0000 mg | ORAL_TABLET | Freq: Every day | ORAL | 3 refills | Status: DC

## 2019-07-23 ENCOUNTER — Ambulatory Visit (INDEPENDENT_AMBULATORY_CARE_PROVIDER_SITE_OTHER): Payer: Medicare Other | Admitting: *Deleted

## 2019-07-23 ENCOUNTER — Other Ambulatory Visit: Payer: Self-pay

## 2019-07-23 DIAGNOSIS — I480 Paroxysmal atrial fibrillation: Secondary | ICD-10-CM | POA: Diagnosis not present

## 2019-07-23 DIAGNOSIS — I4891 Unspecified atrial fibrillation: Secondary | ICD-10-CM

## 2019-07-23 MED ORDER — APIXABAN 5 MG PO TABS: 5.0000 mg | ORAL_TABLET | Freq: Two times a day (BID) | ORAL | 0 refills | Status: DC

## 2019-07-23 NOTE — Progress Notes (Signed)
I am sorry to hear that she did not tolerate the flecainide, even at very low dose.  I was hopeful that that would have been a good choice for her. Would recommend that she had an EP consultation for further discussion of antiarrhythmic options, as she will remain at risk for recurring atrial fibrillation and will need to have a plan going forward.

## 2019-07-23 NOTE — Progress Notes (Signed)
Pt here for EKG per 3/16 office appt started flecainide 50 mg bid - said she stopped taking this a week ago made her feel faint and like she was going to pass out - she tried flecainide 25 mg bid for 4 days and symptoms did not improve - EKG sent to provider - pt also c/o SBP being 160s-180s - says she gets a headache when her SBP is in this range - says this has been going on since February has been taking valsartan 40 mg daily as suggested at [facility] - manual nurse check today BP 128/64

## 2019-07-23 NOTE — Progress Notes (Signed)
Pt voiced understanding and agreeable to consult with EP will place referral and forward to schedulers

## 2019-07-23 NOTE — Addendum Note (Signed)
Addended by: Julian Hy T on: 07/23/2019 05:02 PM   Modules accepted: Orders

## 2019-07-24 DIAGNOSIS — I10 Essential (primary) hypertension: Secondary | ICD-10-CM | POA: Diagnosis not present

## 2019-07-24 DIAGNOSIS — E7849 Other hyperlipidemia: Secondary | ICD-10-CM | POA: Diagnosis not present

## 2019-07-25 DIAGNOSIS — H35363 Drusen (degenerative) of macula, bilateral: Secondary | ICD-10-CM | POA: Diagnosis not present

## 2019-08-07 ENCOUNTER — Telehealth (INDEPENDENT_AMBULATORY_CARE_PROVIDER_SITE_OTHER): Payer: Medicare Other | Admitting: Internal Medicine

## 2019-08-07 ENCOUNTER — Encounter: Payer: Self-pay | Admitting: Internal Medicine

## 2019-08-07 ENCOUNTER — Other Ambulatory Visit: Payer: Self-pay

## 2019-08-07 DIAGNOSIS — I10 Essential (primary) hypertension: Secondary | ICD-10-CM

## 2019-08-07 DIAGNOSIS — D6869 Other thrombophilia: Secondary | ICD-10-CM

## 2019-08-07 DIAGNOSIS — I480 Paroxysmal atrial fibrillation: Secondary | ICD-10-CM

## 2019-08-07 NOTE — Progress Notes (Addendum)
Electrophysiology TeleHealth Note   Due to national recommendations of social distancing due to Kristen Mckenzie 19, Audio/video telehealth visit is felt to be most appropriate for this patient at this time.  See MyChart message from today for patient consent regarding telehealth for [facility].   Date:  08/07/2019   ID:  Kristen Mckenzie, DOB 1941/03/15, MRN [id number]  Location: home Provider location: [address], [place] Evaluation Performed: New patient consult  PCP:  Caryl Bis, MD  Cardiologist:  Rozann Lesches, MD Electrophysiologist:  None   Chief Complaint:  afib  History of Present Illness:    Kristen Mckenzie is a 79 y.o. female who presents via audio/video conferencing for a telehealth visit today.   The patient is referred for new consultation regarding afib by Dr Domenic Polite.  She reports initially having afib about 02-23-23 after the death of her husband.  She reports symptoms of tachypalpitations. She has had several episodes for which she has gone to the hospital [facility]).  She has required cardioversion previously. She has failed medical therapy with diltiazem, metoprolol, and flecainide. She did not feel well with flecainide.  She reports that she felt like she may pass out with this medicine.  Today, she denies symptoms of palpitations, chest pain, shortness of breath, orthopnea, PND, lower extremity edema, claudication, dizziness, presyncope, syncope, bleeding, or neurologic sequela. The patient is tolerating medications without difficulties and is otherwise without complaint today.   she denies symptoms of cough, fevers, chills, or new SOB worrisome for COVID 19.   Past Medical History:  Diagnosis Date  . Anxiety   . Arthritis   . Cholecystolithiasis   . Collagen vascular disease ([place])   . Cystocele with prolapse   . Hypertension   . PAF (paroxysmal atrial fibrillation) ([place])   . Rectocele     Past Surgical History:  Procedure Laterality  Date  . ANTERIOR AND POSTERIOR REPAIR N/A 02/21/2018   Procedure: ANTERIOR (CYSTOCELE) REPAIR;  Surgeon: Bjorn Loser, MD;  Location: [place];  Service: Urology;  Laterality: N/A;  . CYSTOSCOPY N/A 02/21/2018   Procedure: CYSTOSCOPY;  Surgeon: Bjorn Loser, MD;  Location: [facility];  Service: Urology;  Laterality: N/A;  . EYE SURGERY Bilateral 2011   cataract extraction   . LAPAROSCOPIC VAGINAL HYSTERECTOMY WITH SALPINGO OOPHORECTOMY Bilateral 02/21/2018   Procedure: LAPAROSCOPIC ASSISTED VAGINAL HYSTERECTOMY WITH SALPINGO OOPHORECTOMY;  Surgeon: Maisie Fus, MD;  Location: [facility];  Service: Gynecology;  Laterality: Bilateral;  Dr. Matilde Sprang to follow  . LEFT HEART CATH AND CORONARY ANGIOGRAPHY N/A 02/07/2019   Procedure: LEFT HEART CATH AND CORONARY ANGIOGRAPHY;  Surgeon: Burnell Blanks, MD;  Location: [place] [facility];  Service: Cardiovascular;  Laterality: N/A;    Current Outpatient Medications  Medication Sig Dispense Refill  . amoxicillin (AMOXIL) 500 MG tablet Take 500 mg by mouth 2 (two) times daily.    Marland Kitchen apixaban (ELIQUIS) 5 MG TABS tablet Take 1 tablet (5 mg total) by mouth 2 (two) times daily. 28 tablet 0  . atorvastatin (LIPITOR) 40 MG tablet Take 1 tablet (40 mg total) by mouth daily. 90 tablet 3  . Calcium Carbonate-Vitamin D (CALCARB 600/D PO) Take 1 tablet by mouth daily.     . cholecalciferol (VITAMIN D) 1000 units tablet Take 1,000 Units daily by mouth.    . COD LIVER OIL PO Take 1 capsule by mouth 2 (two) times a week.     Marland Kitchen  MAGNESIUM PO Take 1 tablet by mouth 3 (three) times a week.     . metoprolol tartrate (LOPRESSOR) 25 MG tablet Take 0.5 tablets (12.5 mg total) by mouth 2 (two) times daily. 90 tablet 3  . Multiple Vitamin (MULTIVITAMIN WITH MINERALS) TABS tablet Take 1 tablet daily by mouth.    . Multiple Vitamins-Minerals (ZINC PO) Take 1 tablet by mouth daily.     . Potassium 99 MG TABS  Take 1 tablet 3 (three) times a week by mouth.    . valsartan (DIOVAN) 40 MG tablet Take 1 tablet (40 mg total) by mouth daily. 90 tablet 3  . vitamin A 10000 UNIT capsule Take 10,000 Units by mouth 2 (two) times a week.     . vitamin C (ASCORBIC ACID) 500 MG tablet Take 500 mg daily by mouth.    . vitamin E 400 UNIT capsule Take 400 Units by mouth once a week.     . zolpidem (AMBIEN) 5 MG tablet Take 2.5-5 mg by mouth at bedtime as needed for sleep.      No current facility-administered medications for this visit.    Allergies:   Phenobarbital, Tamiflu [oseltamivir phosphate], Depakote [valproic acid], and Lisinopril   Social History:  The patient  reports that she has never smoked. She has never used smokeless tobacco. She reports that she does not drink alcohol or use drugs.   Family History:  [name]  family history includes Hypertension in her father.    ROS:  Please see the history of present illness.   All other systems are personally reviewed and negative.    Exam:    Vital Signs:  There were no vitals taken for this visit.   Well sounding, alert and conversant    Labs/Other Tests and Data Reviewed:    Recent Labs: 02/06/2019: TSH 2.815 07/03/2019: BUN 14; Creatinine, Ser 0.76; Hemoglobin 14.1; Magnesium 2.0; Platelets 235; Potassium 3.9; Sodium 139   Wt Readings from Last 3 Encounters:  07/09/19 128 lb (58.1 kg)  07/03/19 125 lb (56.7 kg)  03/14/19 126 lb (57.2 kg)     Other studies personally reviewed: Additional studies/ records that were reviewed today include: Dr Chuck Hint notes, prior echo, hospital records  Review of the above records today demonstrates: as above  ASSESSMENT & PLAN:    1.  Paroxysmal atrial fibrillation The patient has symptomatic, recurrent paroxysmal atrial fibrillation. she has failed medical therapy with flecainide Chads2vasc score is 4.  she is anticoagulated with eliquis . Therapeutic strategies for afib including medicine  (tikosyn, amiodarone) and ablation were discussed in detail with the patient today. Risk, benefits, and alternatives to EP study and radiofrequency ablation for afib were also discussed in detail today. These risks include but are not limited to stroke, bleeding, vascular damage, tamponade, perforation, damage to the esophagus, lungs, and other structures, pulmonary vein stenosis, worsening renal function, and death. The patient understands these risk and wishes to think about this further with her daughters.    If she decides to proceed with ablation or tikosyn then she will contact my office.  Otherwise, she will follow-up as scheduled with Dr Domenic Polite.  Given her intolerance of flecainide and fear of medicines, I would advise ablation over AADs if the patient is willing.  2. Hypertensive cardiovascular disease Would consider spironolactone or amlodipine in the future. Sodium restriction is advised    Follow-up:  With Dr Domenic Polite as scheduled I will see as needed  Current medicines are reviewed at length  with the patient today.   The patient does not have concerns regarding her medicines.  The following changes were made today:  none  Labs/ tests ordered today include:  No orders of the defined types were placed in this encounter.   Patient Risk:  after full review of this patients clinical status, I feel that they are at moderate risk at this time.   Today, I have spent 20 minutes with the patient with telehealth technology discussing afib .    Signed, Thompson Grayer MD, [place] 08/07/2019 12:43 PM   [address] [place] [address] [phone] (office) [phone] (fax)

## 2019-08-07 NOTE — Addendum Note (Signed)
Addended by: Thompson Grayer on: 08/07/2019 12:53 PM   Modules accepted: Orders, Level of Service

## 2019-08-19 ENCOUNTER — Telehealth: Payer: Self-pay | Admitting: Internal Medicine

## 2019-08-19 DIAGNOSIS — I4891 Unspecified atrial fibrillation: Secondary | ICD-10-CM

## 2019-08-19 NOTE — Telephone Encounter (Signed)
Pt scheduled for afib ablation on Sep 19, 2019 at 10:30 am  Labs/covid test scheduled  Work up complete-Pt coming to [address] for labs and instruction May 12.

## 2019-08-19 NOTE — Telephone Encounter (Signed)
New message   Pt is calling stating that she would like to proceed with procedure. Please call to set up.

## 2019-09-02 DIAGNOSIS — Z6823 Body mass index (BMI) 23.0-23.9, adult: Secondary | ICD-10-CM | POA: Diagnosis not present

## 2019-09-02 DIAGNOSIS — R300 Dysuria: Secondary | ICD-10-CM | POA: Diagnosis not present

## 2019-09-04 ENCOUNTER — Other Ambulatory Visit: Payer: Self-pay

## 2019-09-04 ENCOUNTER — Other Ambulatory Visit: Payer: Medicare Other | Admitting: *Deleted

## 2019-09-04 DIAGNOSIS — I4891 Unspecified atrial fibrillation: Secondary | ICD-10-CM | POA: Diagnosis not present

## 2019-09-04 LAB — BASIC METABOLIC PANEL
BUN/Creatinine Ratio: 18 (ref 12–28)
BUN: 12 mg/dL (ref 8–27)
CO2: 29 mmol/L (ref 20–29)
Calcium: 9.4 mg/dL (ref 8.7–10.3)
Chloride: 97 mmol/L (ref 96–106)
Creatinine, Ser: 0.67 mg/dL (ref 0.57–1.00)
GFR calc Af Amer: 97 mL/min/{1.73_m2} (ref >59)
GFR calc non Af Amer: 84 mL/min/{1.73_m2} (ref >59)
Glucose: 93 mg/dL (ref 65–99)
Potassium: 4.2 mmol/L (ref 3.5–5.2)
Sodium: 133 mmol/L — ABNORMAL LOW (ref 134–144)

## 2019-09-04 LAB — CBC WITH DIFFERENTIAL/PLATELET
Basophils Absolute: 0.1 10*3/uL (ref 0.0–0.2)
Basos: 1 %
EOS (ABSOLUTE): 0.2 10*3/uL (ref 0.0–0.4)
Eos: 3 %
Hematocrit: 36.9 % (ref 34.0–46.6)
Hemoglobin: 12.7 g/dL (ref 11.1–15.9)
Lymphocytes Absolute: 1.4 10*3/uL (ref 0.7–3.1)
Lymphs: 19 %
MCH: 32 pg (ref 26.6–33.0)
MCHC: 34.4 g/dL (ref 31.5–35.7)
MCV: 93 fL (ref 79–97)
Monocytes Absolute: 0.9 10*3/uL (ref 0.1–0.9)
Monocytes: 12 %
Neutrophils Absolute: 4.8 10*3/uL (ref 1.4–7.0)
Neutrophils: 65 %
Platelets: 208 10*3/uL (ref 150–450)
RBC: 3.97 x10E6/uL (ref 3.77–5.28)
RDW: 12.9 % (ref 11.7–15.4)
WBC: 7.4 10*3/uL (ref 3.4–10.8)

## 2019-09-12 ENCOUNTER — Telehealth (HOSPITAL_COMMUNITY): Payer: Self-pay | Admitting: *Deleted

## 2019-09-12 NOTE — Telephone Encounter (Signed)
Reaching out to patient to offer assistance regarding upcoming cardiac imaging study; pt verbalizes understanding of appt date/time, parking situation and where to check in, pre-test NPO status and medications ordered, and verified current allergies; name and call back number provided for further questions should they arise  Maika Mcelveen Tai RN Navigator Cardiac Imaging [facility] [phone] office [phone] cell 

## 2019-09-13 ENCOUNTER — Ambulatory Visit (HOSPITAL_COMMUNITY)
Admission: RE | Admit: 2019-09-13 | Discharge: 2019-09-13 | Disposition: A | Discharge status: Home / Self Care | Payer: Medicare Other | Source: Ambulatory Visit | Attending: Internal Medicine | Admitting: Internal Medicine

## 2019-09-13 ENCOUNTER — Other Ambulatory Visit: Payer: Self-pay

## 2019-09-13 DIAGNOSIS — I4891 Unspecified atrial fibrillation: Secondary | ICD-10-CM | POA: Insufficient documentation

## 2019-09-13 MED ORDER — IOHEXOL 350 MG/ML SOLN
80.0000 mL | Freq: Once | INTRAVENOUS | Status: AC | PRN
  Administered 2019-09-13: 80 mL via INTRAVENOUS

## 2019-09-16 ENCOUNTER — Other Ambulatory Visit (HOSPITAL_COMMUNITY): Payer: Medicare Other

## 2019-09-17 ENCOUNTER — Other Ambulatory Visit (HOSPITAL_COMMUNITY)
Admission: RE | Admit: 2019-09-17 | Discharge: 2019-09-17 | Disposition: A | Discharge status: Home / Self Care | Payer: Medicare Other | Source: Ambulatory Visit | Attending: Internal Medicine | Admitting: Internal Medicine

## 2019-09-17 ENCOUNTER — Other Ambulatory Visit: Payer: Self-pay

## 2019-09-17 DIAGNOSIS — Z01812 Encounter for preprocedural laboratory examination: Secondary | ICD-10-CM | POA: Diagnosis not present

## 2019-09-17 DIAGNOSIS — Z20822 Contact with and (suspected) exposure to covid-19: Secondary | ICD-10-CM | POA: Diagnosis not present

## 2019-09-18 LAB — SARS CORONAVIRUS 2 (TAT 6-24 HRS): SARS Coronavirus 2: NEGATIVE

## 2019-09-19 ENCOUNTER — Other Ambulatory Visit: Payer: Self-pay

## 2019-09-19 ENCOUNTER — Encounter (HOSPITAL_COMMUNITY): Payer: Self-pay | Admitting: Internal Medicine

## 2019-09-19 ENCOUNTER — Ambulatory Visit (HOSPITAL_COMMUNITY): Payer: Medicare Other | Admitting: Anesthesiology

## 2019-09-19 ENCOUNTER — Observation Stay (HOSPITAL_COMMUNITY)
Admission: RE | Admit: 2019-09-19 | Discharge: 2019-09-20 | Disposition: A | Discharge status: Home / Self Care | Payer: Medicare Other | Attending: Internal Medicine | Admitting: Internal Medicine

## 2019-09-19 ENCOUNTER — Ambulatory Visit (HOSPITAL_COMMUNITY)
Admission: RE | Disposition: A | Discharge status: Home / Self Care | Payer: Self-pay | Source: Home / Self Care | Attending: Internal Medicine

## 2019-09-19 DIAGNOSIS — F419 Anxiety disorder, unspecified: Secondary | ICD-10-CM | POA: Diagnosis not present

## 2019-09-19 DIAGNOSIS — I4891 Unspecified atrial fibrillation: Secondary | ICD-10-CM | POA: Diagnosis not present

## 2019-09-19 DIAGNOSIS — Z888 Allergy status to other drugs, medicaments and biological substances status: Secondary | ICD-10-CM | POA: Insufficient documentation

## 2019-09-19 DIAGNOSIS — Z79899 Other long term (current) drug therapy: Secondary | ICD-10-CM | POA: Diagnosis not present

## 2019-09-19 DIAGNOSIS — I471 Supraventricular tachycardia: Secondary | ICD-10-CM | POA: Diagnosis not present

## 2019-09-19 DIAGNOSIS — I480 Paroxysmal atrial fibrillation: Principal | ICD-10-CM | POA: Insufficient documentation

## 2019-09-19 DIAGNOSIS — I484 Atypical atrial flutter: Secondary | ICD-10-CM | POA: Diagnosis not present

## 2019-09-19 DIAGNOSIS — I4892 Unspecified atrial flutter: Secondary | ICD-10-CM | POA: Diagnosis not present

## 2019-09-19 DIAGNOSIS — Z7901 Long term (current) use of anticoagulants: Secondary | ICD-10-CM | POA: Insufficient documentation

## 2019-09-19 DIAGNOSIS — I10 Essential (primary) hypertension: Secondary | ICD-10-CM | POA: Diagnosis not present

## 2019-09-19 DIAGNOSIS — M1990 Unspecified osteoarthritis, unspecified site: Secondary | ICD-10-CM | POA: Diagnosis not present

## 2019-09-19 HISTORY — PX: ATRIAL FIBRILLATION ABLATION: EP1191

## 2019-09-19 LAB — POCT ACTIVATED CLOTTING TIME
Activated Clotting Time: 334 seconds
Activated Clotting Time: 323 seconds
Activated Clotting Time: 340 seconds

## 2019-09-19 SURGERY — ATRIAL FIBRILLATION ABLATION
Anesthesia: General

## 2019-09-19 MED ORDER — PROPOFOL 10 MG/ML IV BOLUS
INTRAVENOUS | Status: DC | PRN
  Administered 2019-09-19: 100 mg via INTRAVENOUS

## 2019-09-19 MED ORDER — HYDROCODONE-ACETAMINOPHEN 5-325 MG PO TABS: 1.0000 | ORAL_TABLET | ORAL | Status: DC | PRN

## 2019-09-19 MED ORDER — SODIUM CHLORIDE 0.9% FLUSH: 3.0000 mL | Freq: Two times a day (BID) | INTRAVENOUS | Status: DC

## 2019-09-19 MED ORDER — POTASSIUM 99 MG PO TABS: 99.0000 mg | ORAL_TABLET | ORAL | Status: DC

## 2019-09-19 MED ORDER — ROCURONIUM BROMIDE 10 MG/ML (PF) SYRINGE
PREFILLED_SYRINGE | INTRAVENOUS | Status: DC | PRN
  Administered 2019-09-19: 70 mg via INTRAVENOUS

## 2019-09-19 MED ORDER — ACETAMINOPHEN 325 MG PO TABS
650.0000 mg | ORAL_TABLET | ORAL | Status: DC | PRN
  Administered 2019-09-19: 650 mg via ORAL
  Filled 2019-09-19: qty 2

## 2019-09-19 MED ORDER — PHENYLEPHRINE 40 MCG/ML (10ML) SYRINGE FOR IV PUSH (FOR BLOOD PRESSURE SUPPORT)
PREFILLED_SYRINGE | INTRAVENOUS | Status: DC | PRN
  Administered 2019-09-19 (×2): 120 ug via INTRAVENOUS
  Administered 2019-09-19 (×2): 80 ug via INTRAVENOUS

## 2019-09-19 MED ORDER — HEPARIN (PORCINE) IN NACL 1000-0.9 UT/500ML-% IV SOLN
INTRAVENOUS | Status: DC | PRN
  Administered 2019-09-19: 500 mL

## 2019-09-19 MED ORDER — LIDOCAINE 2% (20 MG/ML) 5 ML SYRINGE
INTRAMUSCULAR | Status: DC | PRN
  Administered 2019-09-19: 100 mg via INTRAVENOUS

## 2019-09-19 MED ORDER — HEPARIN SODIUM (PORCINE) 1000 UNIT/ML IJ SOLN
INTRAMUSCULAR | Status: AC
  Filled 2019-09-19: qty 1

## 2019-09-19 MED ORDER — PHENYLEPHRINE HCL-NACL 10-0.9 MG/250ML-% IV SOLN
INTRAVENOUS | Status: DC | PRN
  Administered 2019-09-19: 15 ug/min via INTRAVENOUS

## 2019-09-19 MED ORDER — PANTOPRAZOLE SODIUM 40 MG PO TBEC
40.0000 mg | DELAYED_RELEASE_TABLET | Freq: Every day | ORAL | Status: DC | PRN
  Filled 2019-09-19: qty 1

## 2019-09-19 MED ORDER — ADULT MULTIVITAMIN W/MINERALS CH
1.0000 | ORAL_TABLET | Freq: Every day | ORAL | Status: DC
  Administered 2019-09-19 – 2019-09-20 (×2): 1 via ORAL
  Filled 2019-09-19 (×2): qty 1

## 2019-09-19 MED ORDER — SODIUM CHLORIDE 0.9% FLUSH: 3.0000 mL | INTRAVENOUS | Status: DC | PRN

## 2019-09-19 MED ORDER — ONDANSETRON HCL 4 MG/2ML IJ SOLN: 4.0000 mg | Freq: Four times a day (QID) | INTRAMUSCULAR | Status: DC | PRN

## 2019-09-19 MED ORDER — ISOPROTERENOL HCL 0.2 MG/ML IJ SOLN
INTRAMUSCULAR | Status: AC
  Filled 2019-09-19: qty 5

## 2019-09-19 MED ORDER — SODIUM CHLORIDE 0.9 % IV SOLN
INTRAVENOUS | Status: DC
  Administered 2019-09-19: 50 mL/h via INTRAVENOUS

## 2019-09-19 MED ORDER — APIXABAN 5 MG PO TABS
5.0000 mg | ORAL_TABLET | Freq: Two times a day (BID) | ORAL | Status: DC
  Administered 2019-09-19 – 2019-09-20 (×2): 5 mg via ORAL
  Filled 2019-09-19 (×2): qty 1

## 2019-09-19 MED ORDER — SODIUM CHLORIDE 0.9 % IV SOLN: 250.0000 mL | INTRAVENOUS | Status: DC | PRN

## 2019-09-19 MED ORDER — METOPROLOL TARTRATE 12.5 MG HALF TABLET
12.5000 mg | ORAL_TABLET | Freq: Two times a day (BID) | ORAL | Status: DC
  Administered 2019-09-19 – 2019-09-20 (×2): 12.5 mg via ORAL
  Filled 2019-09-19 (×3): qty 1

## 2019-09-19 MED ORDER — VITAMIN A 3 MG (10000 UNIT) PO CAPS
10000.0000 [IU] | ORAL_CAPSULE | ORAL | Status: DC
  Administered 2019-09-20: 10000 [IU] via ORAL
  Filled 2019-09-19: qty 1

## 2019-09-19 MED ORDER — VITAMIN D 25 MCG (1000 UNIT) PO TABS
1000.0000 [IU] | ORAL_TABLET | Freq: Every day | ORAL | Status: DC
  Administered 2019-09-19 – 2019-09-20 (×2): 1000 [IU] via ORAL
  Filled 2019-09-19 (×3): qty 1

## 2019-09-19 MED ORDER — EPHEDRINE SULFATE-NACL 50-0.9 MG/10ML-% IV SOSY
PREFILLED_SYRINGE | INTRAVENOUS | Status: DC | PRN
  Administered 2019-09-19: 7.5 mg via INTRAVENOUS
  Administered 2019-09-19: 5 mg via INTRAVENOUS

## 2019-09-19 MED ORDER — FENTANYL CITRATE (PF) 100 MCG/2ML IJ SOLN
INTRAMUSCULAR | Status: DC | PRN
  Administered 2019-09-19: 100 ug via INTRAVENOUS

## 2019-09-19 MED ORDER — HEPARIN (PORCINE) IN NACL 1000-0.9 UT/500ML-% IV SOLN
INTRAVENOUS | Status: AC
  Filled 2019-09-19: qty 500

## 2019-09-19 MED ORDER — IRBESARTAN 75 MG PO TABS
75.0000 mg | ORAL_TABLET | Freq: Every day | ORAL | Status: DC
  Administered 2019-09-19 – 2019-09-20 (×2): 75 mg via ORAL
  Filled 2019-09-19 (×2): qty 1

## 2019-09-19 MED ORDER — HEPARIN SODIUM (PORCINE) 1000 UNIT/ML IJ SOLN
INTRAMUSCULAR | Status: DC | PRN
  Administered 2019-09-19: 14000 [IU] via INTRAVENOUS
  Administered 2019-09-19: 1000 [IU] via INTRAVENOUS

## 2019-09-19 MED ORDER — ZOLPIDEM TARTRATE 5 MG PO TABS
5.0000 mg | ORAL_TABLET | Freq: Every evening | ORAL | Status: DC | PRN
  Administered 2019-09-20: 5 mg via ORAL
  Filled 2019-09-19 (×2): qty 1

## 2019-09-19 MED ORDER — DEXAMETHASONE SODIUM PHOSPHATE 10 MG/ML IJ SOLN
INTRAMUSCULAR | Status: DC | PRN
  Administered 2019-09-19: 4 mg via INTRAVENOUS

## 2019-09-19 MED ORDER — ATORVASTATIN CALCIUM 40 MG PO TABS
40.0000 mg | ORAL_TABLET | Freq: Every day | ORAL | Status: DC
  Administered 2019-09-19 – 2019-09-20 (×2): 40 mg via ORAL
  Filled 2019-09-19 (×2): qty 1

## 2019-09-19 MED ORDER — ZINC SULFATE 220 (50 ZN) MG PO CAPS
220.0000 mg | ORAL_CAPSULE | ORAL | Status: DC
  Administered 2019-09-20: 220 mg via ORAL
  Filled 2019-09-19: qty 1

## 2019-09-19 SURGICAL SUPPLY — 17 items
BLANKET WARM UNDERBOD FULL ACC (MISCELLANEOUS) ×3
CATH SMTCH THERMOCOOL SF DF (CATHETERS) ×3
CATH SOUNDSTAR ECO 8FR (CATHETERS) ×3
CATH WEBSTER BI DIR CS D-F CRV (CATHETERS) ×3
COVER SWIFTLINK CONNECTOR (BAG) ×3
NDL BAYLIS TRANSSEPTAL 71CM (NEEDLE) IMPLANT
NEEDLE BAYLIS TRANSSEPTAL 71CM (NEEDLE) ×3
PACK EP LATEX FREE (CUSTOM PROCEDURE TRAY) ×3
PACK EP LF (CUSTOM PROCEDURE TRAY) ×1
PAD PRO RADIOLUCENT 2001M-C (PAD) ×3
PATCH CARTO3 (PAD) ×3
PENTARAY F 2-6-2MM (CATHETERS) ×3
PERCLOSE PROGLIDE 6F (VASCULAR PRODUCTS) ×9
SHEATH PINNACLE 7F 10CM (SHEATH) ×6
SHEATH PINNACLE 9F 10CM (SHEATH) ×3
SHEATH SWARTZ TS SL2 63CM 8.5F (SHEATH) ×3
TUBING SMART ABLATE COOLFLOW (TUBING) ×3

## 2019-09-19 NOTE — Transfer of Care (Signed)
Immediate Anesthesia Transfer of Care Note  Patient: Kristen Mckenzie  Procedure(s) Performed: ATRIAL FIBRILLATION ABLATION (N/A )  Patient Location: Cath Lab  Anesthesia Type:General  Level of Consciousness: awake, oriented and patient cooperative  Airway & Oxygen Therapy: Patient Spontanous Breathing and Patient connected to nasal cannula oxygen  Post-op Assessment: Report given to RN and Post -op Vital signs reviewed and stable  Post vital signs: Reviewed  Last Vitals:  Vitals Value Taken Time  BP 123/53 09/19/19 1401  Temp 36.7 C 09/19/19 1353  Pulse 86 09/19/19 1403  Resp 22 09/19/19 1403  SpO2 92 % 09/19/19 1403  Vitals shown include unvalidated device data.  Last Pain:  Vitals:   09/19/19 1353  TempSrc: Temporal         Complications: No apparent anesthesia complications

## 2019-09-19 NOTE — Anesthesia Procedure Notes (Signed)
Procedure Name: Intubation Date/Time: 09/19/2019 10:47 AM Performed by: Jenne Campus, CRNA Pre-anesthesia Checklist: Patient identified, Emergency Drugs available, Suction available and Patient being monitored Patient Re-evaluated:Patient Re-evaluated prior to induction Oxygen Delivery Method: Circle System Utilized Preoxygenation: Pre-oxygenation with 100% oxygen Induction Type: IV induction Ventilation: Mask ventilation without difficulty Laryngoscope Size: Miller and 2 Grade View: Grade I Tube type: Oral Tube size: 7.0 mm Number of attempts: 1 Airway Equipment and Method: Stylet and Oral airway Placement Confirmation: ETT inserted through vocal cords under direct vision,  positive ETCO2 and breath sounds checked- equal and bilateral Secured at: 22 cm Tube secured with: Tape Dental Injury: Teeth and Oropharynx as per pre-operative assessment

## 2019-09-19 NOTE — H&P (Signed)
Chief Complaint:  afib  History of Present Illness:    Kristen Mckenzie is a 79 y.o. female who presents today for afib ablation.  She reports initially having afib about Mar 10, 2023 after the death of her husband.  She reports symptoms of tachypalpitations. She has had several episodes for which she has gone to the hospital [facility]).  She has required cardioversion previously. She has failed medical therapy with diltiazem, metoprolol, and flecainide. She did not feel well with flecainide.  She reports that she felt like she may pass out with this medicine.  Today, she denies symptoms of palpitations, chest pain, shortness of breath, orthopnea, PND, lower extremity edema, claudication, dizziness, presyncope, syncope, bleeding, or neurologic sequela. The patient is tolerating medications without difficulties and is otherwise without complaint today.   she denies symptoms of cough, fevers, chills, or new SOB worrisome for COVID 19.       Past Medical History:  Diagnosis Date  . Anxiety   . Arthritis   . Cholecystolithiasis   . Collagen vascular disease ([place])   . Cystocele with prolapse   . Hypertension   . PAF (paroxysmal atrial fibrillation) ([place])   . Rectocele          Past Surgical History:  Procedure Laterality Date  . ANTERIOR AND POSTERIOR REPAIR N/A 02/21/2018   Procedure: ANTERIOR (CYSTOCELE) REPAIR;  Surgeon: Bjorn Loser, MD;  Location: [facility];  Service: Urology;  Laterality: N/A;  . CYSTOSCOPY N/A 02/21/2018   Procedure: CYSTOSCOPY;  Surgeon: Bjorn Loser, MD;  Location: [facility];  Service: Urology;  Laterality: N/A;  . EYE SURGERY Bilateral 2011   cataract extraction   . LAPAROSCOPIC VAGINAL HYSTERECTOMY WITH SALPINGO OOPHORECTOMY Bilateral 02/21/2018   Procedure: LAPAROSCOPIC ASSISTED VAGINAL HYSTERECTOMY WITH SALPINGO OOPHORECTOMY;  Surgeon: Maisie Fus, MD;  Location: [facility];   Service: Gynecology;  Laterality: Bilateral;  Dr. Matilde Sprang to follow  . LEFT HEART CATH AND CORONARY ANGIOGRAPHY N/A 02/07/2019   Procedure: LEFT HEART CATH AND CORONARY ANGIOGRAPHY;  Surgeon: Burnell Blanks, MD;  Location: [facility];  Service: Cardiovascular;  Laterality: N/A;          Current Outpatient Medications  Medication Sig Dispense Refill  . amoxicillin (AMOXIL) 500 MG tablet Take 500 mg by mouth 2 (two) times daily.    Marland Kitchen apixaban (ELIQUIS) 5 MG TABS tablet Take 1 tablet (5 mg total) by mouth 2 (two) times daily. 28 tablet 0  . atorvastatin (LIPITOR) 40 MG tablet Take 1 tablet (40 mg total) by mouth daily. 90 tablet 3  . Calcium Carbonate-Vitamin D (CALCARB 600/D PO) Take 1 tablet by mouth daily.     . cholecalciferol (VITAMIN D) 1000 units tablet Take 1,000 Units daily by mouth.    . COD LIVER OIL PO Take 1 capsule by mouth 2 (two) times a week.     Marland Kitchen MAGNESIUM PO Take 1 tablet by mouth 3 (three) times a week.     . metoprolol tartrate (LOPRESSOR) 25 MG tablet Take 0.5 tablets (12.5 mg total) by mouth 2 (two) times daily. 90 tablet 3  . Multiple Vitamin (MULTIVITAMIN WITH MINERALS) TABS tablet Take 1 tablet daily by mouth.    . Multiple Vitamins-Minerals (ZINC PO) Take 1 tablet by mouth daily.     . Potassium 99 MG TABS Take 1 tablet 3 (three) times a week by mouth.    . valsartan (DIOVAN) 40 MG tablet Take 1 tablet (40 mg  total) by mouth daily. 90 tablet 3  . vitamin A 10000 UNIT capsule Take 10,000 Units by mouth 2 (two) times a week.     . vitamin C (ASCORBIC ACID) 500 MG tablet Take 500 mg daily by mouth.    . vitamin E 400 UNIT capsule Take 400 Units by mouth once a week.     . zolpidem (AMBIEN) 5 MG tablet Take 2.5-5 mg by mouth at bedtime as needed for sleep.      No current facility-administered medications for this visit.    Allergies:   Phenobarbital, Tamiflu [oseltamivir phosphate], Depakote [valproic acid], and  Lisinopril   Social History:  The patient  reports that she has never smoked. She has never used smokeless tobacco. She reports that she does not drink alcohol or use drugs.   Family History:  [name]  family history includes Hypertension in her father.    ROS:  Please see the history of present illness.   All other systems are personally reviewed and negative.   Physical Exam: Vitals:   09/19/19 0829  BP: (!) 163/84  Pulse: 65  Resp: 15  Temp: 97.9 F (36.6 C)  TempSrc: Skin  SpO2: 97%  Weight: 57.2 kg  Height: 5\' 3"  (1.6 m)    GEN- The patient is elderly appearing, alert and oriented x 3 today.   Head- normocephalic, atraumatic Eyes-  Sclera clear, conjunctiva pink Ears- hearing intact Oropharynx- clear Neck- supple, Lungs-  normal work of breathing Heart- Regular rate and rhythm  GI- soft, NT, ND, + BS Extremities- no clubbing, cyanosis, or edema,  hematoma/ bruit  Neuro- strength and sensation are intact  Labs/Other Tests and Data Reviewed:    Recent Labs: 02/06/2019: TSH 2.815 07/03/2019: BUN 14; Creatinine, Ser 0.76; Hemoglobin 14.1; Magnesium 2.0; Platelets 235; Potassium 3.9; Sodium 139      Wt Readings from Last 3 Encounters:  07/09/19 128 lb (58.1 kg)  07/03/19 125 lb (56.7 kg)  03/14/19 126 lb (57.2 kg)     Other studies personally reviewed: Additional studies/ records that were reviewed today include: Dr Chuck Hint notes, prior echo, hospital records  Review of the above records today demonstrates: as above  ASSESSMENT & PLAN:    1.  Paroxysmal atrial fibrillation The patient has symptomatic, recurrent paroxysmal atrial fibrillation. she has failed medical therapy with flecainide Chads2vasc score is 4.  she is anticoagulated with eliquis .  Risk, benefits, and alternatives to EP study and radiofrequency ablation for afib were also discussed in detail today. These risks include but are not limited to stroke, bleeding, vascular  damage, tamponade, perforation, damage to the esophagus, lungs, and other structures, pulmonary vein stenosis, worsening renal function, and death. The patient understands these risk and wishes to proceed.  Cardiac CT reviewed with her at length today.  She reports compliance with eliquis without interruption.  Thompson Grayer MD, [place] 09/19/2019 10:25 AM

## 2019-09-19 NOTE — Progress Notes (Signed)
Pt was ready to transfer to short stay.  Placed on the monitor when we discovered the bed linens were soiled and needed to be changed.  Despite the patient keeping her legs straight, her groin had a rebleed.  Pressure was applied immediately for a total of 30 minutes.   While holding manual pressure, the patient advised me that this had happened in the past with a catheterization procedure and that she was very nervous about going home, as she lives a good distance from the hospital.    Dr. Rayann Heman was paged to notify him of the rebleed and the patient's concern.  Dr. Rayann Heman decided to admit the patient.    She is currently stable with a level 0 groin site.  There is a bruise the site and a small hematoma approximately the size of a dime.    Dr. Jackalyn Lombard P.A., Jonni Sanger, rounded on the patient and discussed with the plan for this evening and tomorrow's discharge, if the site remains stable through the night and with ambulation in the morning.    The patient is awaiting a bed assignment.

## 2019-09-19 NOTE — Anesthesia Preprocedure Evaluation (Addendum)
Anesthesia Evaluation  Patient identified by MRN, date of birth, ID band Patient awake    Reviewed: Allergy & Precautions, NPO status , Patient's Chart, lab work & pertinent test results  History of Anesthesia Complications Negative for: history of anesthetic complications  Airway Mallampati: I  TM Distance: >3 FB Neck ROM: Full    Dental no notable dental hx. (+) Teeth Intact, Caps, Dental Advisory Given   Pulmonary neg pulmonary ROS,    Pulmonary exam normal breath sounds clear to auscultation       Cardiovascular hypertension, Pt. on medications Normal cardiovascular exam+ dysrhythmias Atrial Fibrillation  Rhythm:Regular Rate:Normal  Coronary Arteries: Normal coronary origin. Right dominance. The study was performed without use of NTG and insufficient for plaque evaluation. Calcium score is 0.  5. Significant mitral annular calcifications.   Neuro/Psych PSYCHIATRIC DISORDERS Anxiety negative neurological ROS     GI/Hepatic negative GI ROS, Neg liver ROS,   Endo/Other  negative endocrine ROS  Renal/GU negative Renal ROS     Musculoskeletal   Abdominal Normal abdominal exam  (+)   Peds  Hematology negative hematology ROS (+)   Anesthesia Other Findings   Reproductive/Obstetrics                           Anesthesia Physical  Anesthesia Plan  ASA: III  Anesthesia Plan: General   Post-op Pain Management:    Induction: Intravenous  PONV Risk Score and Plan: 4 or greater and Ondansetron, Treatment may vary due to age or medical condition and Dexamethasone  Airway Management Planned: Oral ETT  Additional Equipment:   Intra-op Plan:   Post-operative Plan: Extubation in OR  Informed Consent: I have reviewed the patients History and Physical, chart, labs and discussed the procedure including the risks, benefits and alternatives for the proposed anesthesia with the patient or  authorized representative who has indicated his/her understanding and acceptance.     Dental advisory given  Plan Discussed with: CRNA and Anesthesiologist  Anesthesia Plan Comments:        Anesthesia Quick Evaluation

## 2019-09-19 NOTE — Progress Notes (Signed)
Site: right Femoral / venous x 3 Site was a rebleed  Condition prior to removal:  N/A - sheaths had been removed prior to arrival in recovery Type of pressure held: manual Time pressure held: 20 minutes Status of patient during pressure:  stable Condition of site post:  Level 0 Type of dressing applied: gauze w/ transparent Pulses verified: right dorsalis pedis 2+ Patient's condition post:  stable Bedrest begins at:  1520 Post instructions given to patient: yes

## 2019-09-20 DIAGNOSIS — I471 Supraventricular tachycardia: Secondary | ICD-10-CM | POA: Diagnosis not present

## 2019-09-20 DIAGNOSIS — I484 Atypical atrial flutter: Secondary | ICD-10-CM | POA: Diagnosis not present

## 2019-09-20 DIAGNOSIS — I480 Paroxysmal atrial fibrillation: Secondary | ICD-10-CM

## 2019-09-20 DIAGNOSIS — I10 Essential (primary) hypertension: Secondary | ICD-10-CM | POA: Diagnosis not present

## 2019-09-20 DIAGNOSIS — Z7901 Long term (current) use of anticoagulants: Secondary | ICD-10-CM | POA: Diagnosis not present

## 2019-09-20 DIAGNOSIS — Z79899 Other long term (current) drug therapy: Secondary | ICD-10-CM | POA: Diagnosis not present

## 2019-09-20 LAB — GLUCOSE, CAPILLARY
Glucose-Capillary: 110 mg/dL — ABNORMAL HIGH (ref 70–99)
Glucose-Capillary: 117 mg/dL — ABNORMAL HIGH (ref 70–99)

## 2019-09-20 MED ORDER — ACETAMINOPHEN 325 MG PO TABS: 650.0000 mg | ORAL_TABLET | ORAL | Status: AC | PRN

## 2019-09-20 MED ORDER — OFF THE BEAT BOOK
Freq: Once | Status: DC
  Filled 2019-09-20: qty 1

## 2019-09-20 MED FILL — Isoproterenol HCl Inj 0.2 MG/ML: INTRAMUSCULAR | Qty: 5 | Status: AC

## 2019-09-20 MED FILL — Lidocaine HCl Local Inj 1%: INTRAMUSCULAR | Qty: 60 | Status: AC

## 2019-09-20 NOTE — Anesthesia Postprocedure Evaluation (Signed)
Anesthesia Post Note  Patient: Kristen Mckenzie  Procedure(s) Performed: ATRIAL FIBRILLATION ABLATION (N/A )     Patient location during evaluation: PACU Anesthesia Type: General Level of consciousness: sedated Pain management: pain level controlled Vital Signs Assessment: post-procedure vital signs reviewed and stable Respiratory status: spontaneous breathing and respiratory function stable Cardiovascular status: stable Postop Assessment: no apparent nausea or vomiting Anesthetic complications: no    Last Vitals:  Vitals:   09/20/19 0850 09/20/19 1000  BP: (!) 144/69 (!) 128/54  Pulse:    Resp: 15 17  Temp:    SpO2:      Last Pain:  Vitals:   09/20/19 0423  TempSrc: Oral  PainSc:                  SINGER,JAMES DANIEL

## 2019-09-20 NOTE — Discharge Instructions (Signed)
Cardiac Ablation, Care After  This sheet gives you information about how to care for yourself after your procedure. Your health care provider may also give you more specific instructions. If you have problems or questions, contact your health care provider. What can I expect after the procedure? After the procedure, it is common to have:  Bruising around your puncture site.  Tenderness around your puncture site.  Skipped heartbeats.  Tiredness (fatigue).  Follow these instructions at home: Puncture site care   Follow instructions from your health care provider about how to take care of your puncture site. Make sure you: ? If present, leave stitches (sutures), skin glue, or adhesive strips in place. These skin closures may need to stay in place for up to 2 weeks. If adhesive strip edges start to loosen and curl up, you may trim the loose edges. Do not remove adhesive strips completely unless your health care provider tells you to do that.  Check your puncture site every day for signs of infection. Check for: ? Redness, swelling, or pain. ? Fluid or blood. If your puncture site starts to bleed, lie down on your back, apply firm pressure to the area, and contact your health care provider. ? Warmth. ? Pus or a bad smell. Driving  Do not drive until Monday, [id number]. (Do not resume driving if you have previously been instructed not to drive for other health reasons.)  Do not drive or use heavy machinery while taking prescription pain medicine. Activity  Avoid activities that take a lot of effort for at least 7 days after your procedure.  Do not lift anything that is heavier than 5 lb (4.5 kg) for one week.   No sexual activity for 1 week.   Return to your normal activities as told by your health care provider. Ask your health care provider what activities are safe for you. General instructions  Take over-the-counter and prescription medicines only as told by your health care  provider.  Do not use any products that contain nicotine or tobacco, such as cigarettes and e-cigarettes. If you need help quitting, ask your health care provider.  You may shower after 24 hours, but Do not take baths, swim, or use a hot tub for 1 week.   Do not drink alcohol for 24 hours after your procedure.  Keep all follow-up visits as told by your health care provider. This is important. Contact a health care provider if:  You have redness, mild swelling, or pain around your puncture site.  You have fluid or blood coming from your puncture site that stops after applying firm pressure to the area.  Your puncture site feels warm to the touch.  You have pus or a bad smell coming from your puncture site.  You have a fever.  You have chest pain or discomfort that spreads to your neck, jaw, or arm.  You are sweating a lot.  You feel nauseous.  You have a fast or irregular heartbeat.  You have shortness of breath.  You are dizzy or light-headed and feel the need to lie down.  You have pain or numbness in the arm or leg closest to your puncture site. Get help right away if:  Your puncture site suddenly swells.  Your puncture site is bleeding and the bleeding does not stop after applying firm pressure to the area. These symptoms may represent a serious problem that is an emergency. Do not wait to see if the symptoms will go away. Get medical  help right away. Call your local emergency services (911 in the [place] Do not drive yourself to the hospital. Summary  After the procedure, it is normal to have bruising and tenderness at the puncture site in your groin, neck, or forearm.  Check your puncture site every day for signs of infection.  Get help right away if your puncture site is bleeding and the bleeding does not stop after applying firm pressure to the area. This is a medical emergency. This information is not intended to replace advice given to you by your health care  provider. Make sure you discuss any questions you have with your health care provider.

## 2019-09-20 NOTE — Discharge Summary (Signed)
ELECTROPHYSIOLOGY PROCEDURE DISCHARGE SUMMARY    Patient ID: Kristen Mckenzie,  MRN: [id number], DOB/AGE: November 24, 1940 79 y.o.  Admit date: 09/19/2019 Discharge date: 09/20/2019  Primary Care Physician: Caryl Bis, MD  Primary Cardiologist: Rozann Lesches, MD  Electrophysiologist: Dr. Rayann Heman  Primary Discharge Diagnosis:  Atrial Fibrillation  Secondary Discharge Diagnosis:  Atrial tachycardia Atypical atrial flutter   Procedures This Admission:  1.  Electrophysiology study and radiofrequency catheter ablation of Atrial Fibrillation on 09/19/2019 by Dr. Rayann Heman.   This study demonstrated: 1. Sinus rhythm upon presentation.  2. Intracardiac echo reveals a normal sized left atrium. 3. Successful electrical isolation and anatomical encircling of all four pulmonary veins with radiofrequency current. 4. Additional mapping and ablation within the left atrium due to persistence of atrial fibrillation with a posterior wall box demonstrated  5. Atrial fibrillation successfully cardioverted to sinus rhythm. 6. Ectopic atrial tachycardia ablated at the ostium of the coronary sinus 7. 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 No early apparent complications.   Brief HPI: Kristen Mckenzie is a 79 y.o. female with a history of paroxysmal Atrial Fibrillation.  They have failed medical therapy with flecainide. Risks, benefits, and alternatives to catheter ablation of Atrial Fibrillation were reviewed with the patient who wished to proceed.  The patient underwent intracardiac echo during the procedure, and was known not to have missed her anticoagulation leading up to the procedure.   Hospital Course:  The patient was admitted and underwent EPS/RFCA of Atrial Fibrillation with details as outlined above. Initially planned same day discharge, but patient with groin bleeding in recovery, so pressure held and observed overnight.  She was monitored on telemetry overnight which demonstrated NSR.  Groin was stable on exam the day of discharge. The patient was examined and considered to be stable for discharge.  Wound care and restrictions were reviewed with the patient.  The patient will be seen back by Adline Peals, PA in 4 weeks and Dr. Rayann Heman in 12 weeks for post ablation follow up.   CHA2DS2VASC of at least 4, and patients Eliquis has already been resume. Pt and family know to call the atrial fibrillation clinic with any questions post procedure.      Physical Exam: Vitals:   09/19/19 1957 09/19/19 2242 09/19/19 2326 09/20/19 0423  BP: 136/68 (!) 124/57 (!) 118/53 (!) 119/59  Pulse:  82  67  Resp: 18  20 20   Temp: 98.6 F (37 C)  98.4 F (36.9 C) 97.9 F (36.6 C)  TempSrc: Oral  Oral Oral  SpO2: 94%  95% 96%  Weight:    59 kg  Height:        GEN- The patient is well appearing, alert and oriented x 3 today.   HEENT: normocephalic, atraumatic; sclera clear, conjunctiva pink; hearing intact; oropharynx clear; neck supple  Lungs- Clear to ausculation bilaterally, normal work of breathing.  No wheezes, rales, rhonchi Heart- Regular rate and rhythm, no murmurs, rubs or gallops  GI- soft, non-tender, non-distended, bowel sounds present  Extremities- no clubbing, cyanosis, or edema; DP/PT/radial pulses 2+ bilaterally, groin without hematoma/bruit MS- no significant deformity or atrophy Skin- warm and dry, no rash or lesion Psych- euthymic mood, full affect Neuro- strength and sensation are intact   Labs:   Lab Results  Component Value Date   WBC 7.4 09/04/2019   HGB 12.7 09/04/2019   HCT 36.9 09/04/2019   MCV 93 09/04/2019   PLT 208  09/04/2019   No results for input(s): NA, K, CL, CO2, BUN, CREATININE, CALCIUM, PROT, BILITOT, ALKPHOS, ALT, AST, GLUCOSE in the last 168 hours.  Invalid input(s): LABALBU   Discharge Medications:  Allergies as of 09/20/2019      Reactions   Phenobarbital Hives, Swelling     Tamiflu [oseltamivir Phosphate] Swelling, Rash   Depakote [valproic Acid] Hives, Itching   Lisinopril Swelling   PT can only take low dosage      Medication List    TAKE these medications   acetaminophen 325 MG tablet Commonly known as: TYLENOL Take 2 tablets (650 mg total) by mouth every 4 (four) hours as needed for headache or mild pain.   apixaban 5 MG Tabs tablet Commonly known as: Eliquis Take 1 tablet (5 mg total) by mouth 2 (two) times daily.   atorvastatin 40 MG tablet Commonly known as: LIPITOR Take 1 tablet (40 mg total) by mouth daily.   CALCARB 600/D PO Take 1 tablet by mouth daily.   cholecalciferol 1000 units tablet Commonly known as: VITAMIN D Take 1,000 Units daily by mouth.   COD LIVER OIL PO Take 1 capsule by mouth every other day.   metoprolol tartrate 25 MG tablet Commonly known as: LOPRESSOR Take 0.5 tablets (12.5 mg total) by mouth 2 (two) times daily.   multivitamin with minerals Tabs tablet Take 1 tablet daily by mouth.   pantoprazole 40 MG tablet Commonly known as: PROTONIX Take 40 mg by mouth daily as needed (heartburn).   Potassium 99 MG Tabs Take 99 mg by mouth 3 (three) times a week.   valsartan 40 MG tablet Commonly known as: DIOVAN Take 1 tablet (40 mg total) by mouth daily.   vitamin A 10000 UNIT capsule Take 10,000 Units by mouth every other day.   zinc gluconate 50 MG tablet Take 50 mg by mouth every other day.   zolpidem 5 MG tablet Commonly known as: AMBIEN Take 5 mg by mouth at bedtime as needed for sleep.       Disposition:   Follow-up Information    [facility] Follow up on 10/18/2019.   Specialty: Cardiology Why: at 31 for post atrial fib ablation follow up. Please call this clinic with any post op questions. Parking Code for June is 5007. Contact information: [address] [id number] [place] [phone]       Thompson Grayer, MD Follow up  on 12/27/2019.   Specialty: Cardiology Why: at 1145 for post AF ablation follow up Contact information: [place] [address] [phone]           Duration of Discharge Encounter: Greater than 30 minutes including physician time.  Jacalyn Lefevre, PA-C  09/20/2019 9:25 AM

## 2019-09-23 DIAGNOSIS — E7849 Other hyperlipidemia: Secondary | ICD-10-CM | POA: Diagnosis not present

## 2019-09-23 DIAGNOSIS — I10 Essential (primary) hypertension: Secondary | ICD-10-CM | POA: Diagnosis not present

## 2019-09-23 DIAGNOSIS — K219 Gastro-esophageal reflux disease without esophagitis: Secondary | ICD-10-CM | POA: Diagnosis not present

## 2019-09-23 DIAGNOSIS — I480 Paroxysmal atrial fibrillation: Secondary | ICD-10-CM | POA: Diagnosis not present

## 2019-09-25 DIAGNOSIS — H6123 Impacted cerumen, bilateral: Secondary | ICD-10-CM | POA: Diagnosis not present

## 2019-09-27 ENCOUNTER — Other Ambulatory Visit: Payer: Self-pay

## 2019-09-27 ENCOUNTER — Emergency Department (HOSPITAL_COMMUNITY): Payer: Medicare Other

## 2019-09-27 ENCOUNTER — Encounter (HOSPITAL_COMMUNITY): Payer: Self-pay | Admitting: *Deleted

## 2019-09-27 ENCOUNTER — Inpatient Hospital Stay (HOSPITAL_COMMUNITY)
Admission: EM | Admit: 2019-09-27 | Discharge: 2019-09-29 | DRG: 309 | Disposition: A | Discharge status: Home / Self Care | Payer: Medicare Other | Attending: Internal Medicine | Admitting: Internal Medicine

## 2019-09-27 DIAGNOSIS — R079 Chest pain, unspecified: Secondary | ICD-10-CM | POA: Diagnosis not present

## 2019-09-27 DIAGNOSIS — I4819 Other persistent atrial fibrillation: Principal | ICD-10-CM | POA: Diagnosis present

## 2019-09-27 DIAGNOSIS — I10 Essential (primary) hypertension: Secondary | ICD-10-CM | POA: Diagnosis present

## 2019-09-27 DIAGNOSIS — I248 Other forms of acute ischemic heart disease: Secondary | ICD-10-CM | POA: Diagnosis present

## 2019-09-27 DIAGNOSIS — I252 Old myocardial infarction: Secondary | ICD-10-CM

## 2019-09-27 DIAGNOSIS — Z888 Allergy status to other drugs, medicaments and biological substances status: Secondary | ICD-10-CM

## 2019-09-27 DIAGNOSIS — Z79899 Other long term (current) drug therapy: Secondary | ICD-10-CM

## 2019-09-27 DIAGNOSIS — R7989 Other specified abnormal findings of blood chemistry: Secondary | ICD-10-CM | POA: Diagnosis present

## 2019-09-27 DIAGNOSIS — I4891 Unspecified atrial fibrillation: Secondary | ICD-10-CM | POA: Diagnosis present

## 2019-09-27 DIAGNOSIS — F419 Anxiety disorder, unspecified: Secondary | ICD-10-CM | POA: Diagnosis present

## 2019-09-27 DIAGNOSIS — N8110 Cystocele, unspecified: Secondary | ICD-10-CM | POA: Diagnosis present

## 2019-09-27 DIAGNOSIS — Z7901 Long term (current) use of anticoagulants: Secondary | ICD-10-CM

## 2019-09-27 DIAGNOSIS — T461X5A Adverse effect of calcium-channel blockers, initial encounter: Secondary | ICD-10-CM | POA: Diagnosis not present

## 2019-09-27 DIAGNOSIS — R001 Bradycardia, unspecified: Secondary | ICD-10-CM | POA: Diagnosis present

## 2019-09-27 DIAGNOSIS — I110 Hypertensive heart disease with heart failure: Secondary | ICD-10-CM | POA: Diagnosis present

## 2019-09-27 DIAGNOSIS — I5032 Chronic diastolic (congestive) heart failure: Secondary | ICD-10-CM | POA: Diagnosis present

## 2019-09-27 DIAGNOSIS — I959 Hypotension, unspecified: Secondary | ICD-10-CM | POA: Diagnosis not present

## 2019-09-27 DIAGNOSIS — G4700 Insomnia, unspecified: Secondary | ICD-10-CM | POA: Diagnosis present

## 2019-09-27 DIAGNOSIS — Z8249 Family history of ischemic heart disease and other diseases of the circulatory system: Secondary | ICD-10-CM

## 2019-09-27 DIAGNOSIS — Z20822 Contact with and (suspected) exposure to covid-19: Secondary | ICD-10-CM | POA: Diagnosis present

## 2019-09-27 DIAGNOSIS — E785 Hyperlipidemia, unspecified: Secondary | ICD-10-CM | POA: Diagnosis present

## 2019-09-27 DIAGNOSIS — R778 Other specified abnormalities of plasma proteins: Secondary | ICD-10-CM | POA: Diagnosis present

## 2019-09-27 DIAGNOSIS — R002 Palpitations: Secondary | ICD-10-CM | POA: Diagnosis present

## 2019-09-27 DIAGNOSIS — M1990 Unspecified osteoarthritis, unspecified site: Secondary | ICD-10-CM | POA: Diagnosis present

## 2019-09-27 LAB — CBC
HCT: 36.2 % (ref 36.0–46.0)
Hemoglobin: 12.1 g/dL (ref 12.0–15.0)
MCH: 32.3 pg (ref 26.0–34.0)
MCHC: 33.4 g/dL (ref 30.0–36.0)
MCV: 96.5 fL (ref 80.0–100.0)
Platelets: 288 10*3/uL (ref 150–400)
RBC: 3.75 MIL/uL — ABNORMAL LOW (ref 3.87–5.11)
RDW: 13.1 % (ref 11.5–15.5)
WBC: 7.3 10*3/uL (ref 4.0–10.5)
nRBC: 0 % (ref 0.0–0.2)

## 2019-09-27 LAB — BASIC METABOLIC PANEL
Anion gap: 12 (ref 5–15)
BUN: 19 mg/dL (ref 8–23)
CO2: 22 mmol/L (ref 22–32)
Calcium: 9.1 mg/dL (ref 8.9–10.3)
Chloride: 100 mmol/L (ref 98–111)
Creatinine, Ser: 0.65 mg/dL (ref 0.44–1.00)
GFR calc Af Amer: >60 mL/min (ref >60)
GFR calc non Af Amer: >60 mL/min (ref >60)
Glucose, Bld: 122 mg/dL — ABNORMAL HIGH (ref 70–99)
Potassium: 3.8 mmol/L (ref 3.5–5.1)
Sodium: 134 mmol/L — ABNORMAL LOW (ref 135–145)

## 2019-09-27 LAB — HEPATIC FUNCTION PANEL
ALT: 28 U/L (ref 0–44)
AST: 27 U/L (ref 15–41)
Albumin: 4.3 g/dL (ref 3.5–5.0)
Alkaline Phosphatase: 85 U/L (ref 38–126)
Bilirubin, Direct: 0.1 mg/dL (ref 0.0–0.2)
Indirect Bilirubin: 0.4 mg/dL (ref 0.3–0.9)
Total Bilirubin: 0.5 mg/dL (ref 0.3–1.2)
Total Protein: 6.9 g/dL (ref 6.5–8.1)

## 2019-09-27 LAB — TROPONIN I (HIGH SENSITIVITY)
Troponin I (High Sensitivity): 22 ng/L — ABNORMAL HIGH (ref <18)
Troponin I (High Sensitivity): 47 ng/L — ABNORMAL HIGH (ref <18)

## 2019-09-27 MED ORDER — ONDANSETRON HCL 4 MG/2ML IJ SOLN
4.0000 mg | Freq: Four times a day (QID) | INTRAMUSCULAR | Status: DC | PRN
  Administered 2019-09-29: 4 mg via INTRAVENOUS
  Filled 2019-09-27: qty 2

## 2019-09-27 MED ORDER — METOPROLOL TARTRATE 12.5 MG HALF TABLET
12.5000 mg | ORAL_TABLET | Freq: Two times a day (BID) | ORAL | Status: DC
  Administered 2019-09-28: 12.5 mg via ORAL
  Filled 2019-09-27: qty 1

## 2019-09-27 MED ORDER — APIXABAN 5 MG PO TABS: 5.0000 mg | ORAL_TABLET | Freq: Two times a day (BID) | ORAL | Status: DC

## 2019-09-27 MED ORDER — ZOLPIDEM TARTRATE 5 MG PO TABS
5.0000 mg | ORAL_TABLET | Freq: Every evening | ORAL | Status: DC | PRN
  Administered 2019-09-28 (×2): 5 mg via ORAL
  Filled 2019-09-27 (×2): qty 1

## 2019-09-27 MED ORDER — DILTIAZEM HCL-DEXTROSE 125-5 MG/125ML-% IV SOLN (PREMIX)
5.0000 mg/h | INTRAVENOUS | Status: DC
  Administered 2019-09-27: 5 mg/h via INTRAVENOUS
  Filled 2019-09-27: qty 125

## 2019-09-27 MED ORDER — ACETAMINOPHEN 325 MG PO TABS: 650.0000 mg | ORAL_TABLET | Freq: Four times a day (QID) | ORAL | Status: DC | PRN

## 2019-09-27 MED ORDER — DILTIAZEM HCL 25 MG/5ML IV SOLN
10.0000 mg | Freq: Once | INTRAVENOUS | Status: AC
  Administered 2019-09-27: 10 mg via INTRAVENOUS
  Filled 2019-09-27: qty 5

## 2019-09-27 MED ORDER — SODIUM CHLORIDE 0.9% FLUSH: 3.0000 mL | Freq: Once | INTRAVENOUS | Status: DC

## 2019-09-27 MED ORDER — ONDANSETRON HCL 4 MG PO TABS: 4.0000 mg | ORAL_TABLET | Freq: Four times a day (QID) | ORAL | Status: DC | PRN

## 2019-09-27 MED ORDER — ACETAMINOPHEN 650 MG RE SUPP
650.0000 mg | Freq: Four times a day (QID) | RECTAL | Status: DC | PRN
  Filled 2019-09-27: qty 1

## 2019-09-27 MED ORDER — METOPROLOL TARTRATE 5 MG/5ML IV SOLN
5.0000 mg | Freq: Once | INTRAVENOUS | Status: AC
  Administered 2019-09-27: 5 mg via INTRAVENOUS
  Filled 2019-09-27: qty 5

## 2019-09-27 MED ORDER — APIXABAN 5 MG PO TABS
5.0000 mg | ORAL_TABLET | Freq: Once | ORAL | Status: AC
  Administered 2019-09-27: 5 mg via ORAL
  Filled 2019-09-27: qty 1

## 2019-09-27 MED ORDER — ATORVASTATIN CALCIUM 10 MG PO TABS
20.0000 mg | ORAL_TABLET | Freq: Every day | ORAL | Status: DC
  Administered 2019-09-28: 20 mg via ORAL
  Filled 2019-09-27 (×2): qty 2

## 2019-09-27 MED ORDER — PANTOPRAZOLE SODIUM 40 MG PO TBEC: 40.0000 mg | DELAYED_RELEASE_TABLET | Freq: Every day | ORAL | Status: DC | PRN

## 2019-09-27 NOTE — ED Provider Notes (Signed)
[facility] EMERGENCY DEPARTMENT Provider Note   CSN: [id number] Arrival date & time: 09/27/19  1907     History Chief Complaint  Patient presents with  . Chest Pain    Kristen Mckenzie is a 79 y.o. female.  Patient complains of chest pain and palpitations.  Patient has a history of atrial fib and had ablation done last week.  The history is provided by the patient and a relative. No language interpreter was used.  Chest Pain Pain location:  L chest Pain quality: aching   Pain radiates to:  Does not radiate Pain severity:  Mild Onset quality:  Sudden Timing:  Constant Progression:  Worsening Chronicity:  New Associated symptoms: no abdominal pain, no back pain, no cough, no fatigue and no headache        Past Medical History:  Diagnosis Date  . Anxiety   . Arthritis   . Cholecystolithiasis   . Collagen vascular disease ([place])   . Cystocele with prolapse   . Hypertension   . PAF (paroxysmal atrial fibrillation) ([place])   . Rectocele     Patient Active Problem List   Diagnosis Date Noted  . Atrial fibrillation ([place]) 09/19/2019  . Non-ST elevation (NSTEMI) myocardial infarction ([place]) 02/07/2019  . Atrial fibrillation with RVR ([place]) 02/06/2019  . 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 Tachycardia 02/28/2017  . Essential hypertension 02/28/2017  . Acute hyperglycemia 02/28/2017  . 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 ANTERIOR AND POSTERIOR REPAIR N/A 02/21/2018   Procedure: ANTERIOR (CYSTOCELE) REPAIR;  Surgeon: Bjorn Loser, MD;  Location: [facility];  Service: Urology;  Laterality: N/A;  . ATRIAL FIBRILLATION ABLATION N/A 09/19/2019   Procedure: ATRIAL FIBRILLATION ABLATION;  Surgeon: Thompson Grayer, MD;  Location: [facility];  Service: Cardiovascular;  Laterality: N/A;  . CYSTOSCOPY N/A 02/21/2018   Procedure: CYSTOSCOPY;  Surgeon: Bjorn Loser,  MD;  Location[facility];  Service: Urology;  Laterality: N/A;  . EYE SURGERY Bilateral 2011   cataract extraction   . LAPAROSCOPIC VAGINAL HYSTERECTOMY WITH SALPINGO OOPHORECTOMY Bilateral 02/21/2018   Procedure: LAPAROSCOPIC ASSISTED VAGINAL HYSTERECTOMY WITH SALPINGO OOPHORECTOMY;  Surgeon: Maisie Fus, MD;  Location: [facility];  Service: Gynecology;  Laterality: Bilateral;  Dr. Matilde Sprang to follow  . LEFT HEART CATH AND CORONARY ANGIOGRAPHY N/A 02/07/2019   Procedure: LEFT HEART CATH AND CORONARY ANGIOGRAPHY;  Surgeon: Burnell Blanks, MD;  Location: [facility];  Service: Cardiovascular;  Laterality: N/A;     OB History   No obstetric history on file.     Family History  Problem Relation Age of Onset  . Hypertension Father     Social History   Tobacco Use  . Smoking status: Never Smoker  . Smokeless tobacco: Never Used  Substance Use Topics  . Alcohol use: No  . Drug use: No    Home Medications Prior to Admission medications   Medication Sig Start Date End Date Taking? Authorizing Provider  acetaminophen (TYLENOL) 325 MG tablet Take 2 tablets (650 mg total) by mouth every 4 (four) hours as needed for headache or mild pain. 09/20/19  Yes Shirley Friar, PA-C  apixaban (ELIQUIS) 5 MG TABS tablet Take 1 tablet (5 mg total) by mouth 2 (two) times daily. 07/23/19  Yes Satira Sark, MD  atorvastatin (LIPITOR) 40 MG tablet Take 1 tablet (40 mg total) by mouth daily. Patient taking  differently: Take 20 mg by mouth daily.  07/12/19  Yes Satira Sark, MD  Calcium Carbonate-Vitamin D (CALCARB 600/D PO) Take 1 tablet by mouth daily.    Yes [provider]  cholecalciferol (VITAMIN D) 1000 units tablet Take 1,000 Units daily by mouth.   Yes [provider]  COD LIVER OIL PO Take 1 capsule by mouth every other day.    Yes [provider]  metoprolol tartrate (LOPRESSOR) 25 MG tablet Take  0.5 tablets (12.5 mg total) by mouth 2 (two) times daily. 07/12/19  Yes Satira Sark, MD  Multiple Vitamin (MULTIVITAMIN WITH MINERALS) TABS tablet Take 1 tablet daily by mouth.   Yes [provider]  pantoprazole (PROTONIX) 40 MG tablet Take 40 mg by mouth daily as needed (heartburn).   Yes [provider]  Potassium 99 MG TABS Take 99 mg by mouth 3 (three) times a week.    Yes [provider]  valsartan (DIOVAN) 40 MG tablet Take 1 tablet (40 mg total) by mouth daily. 07/12/19  Yes Satira Sark, MD  vitamin A 10000 UNIT capsule Take 10,000 Units by mouth every other day.    Yes [provider]  zinc gluconate 50 MG tablet Take 50 mg by mouth every other day.   Yes [provider]  zolpidem (AMBIEN) 5 MG tablet Take 5 mg by mouth at bedtime as needed for sleep.    Yes [provider]    Allergies    Phenobarbital, Tamiflu [oseltamivir phosphate], Depakote [valproic acid], and Lisinopril  Review of Systems   Review of Systems  Constitutional: Negative for appetite change and fatigue.  HENT: Negative for congestion, ear discharge and sinus pressure.   Eyes: Negative for discharge.  Respiratory: Negative for cough.   Cardiovascular: Positive for chest pain.       Palpitations  Gastrointestinal: Negative for abdominal pain and diarrhea.  Genitourinary: Negative for frequency and hematuria.  Musculoskeletal: Negative for back pain.  Skin: Negative for rash.  Neurological: Negative for seizures and headaches.  Psychiatric/Behavioral: Negative for hallucinations.    Physical Exam Updated Vital Signs BP (!) 108/58   Pulse 83   Temp 98.4 F (36.9 C) (Oral)   Resp (!) 22   Ht 5\' 3"  (1.6 m)   Wt 57.6 kg   SpO2 96%   BMI 22.50 kg/m   Physical Exam Vitals and nursing note reviewed.  Constitutional:      Appearance: She is well-developed.  HENT:     Head: Normocephalic.     Nose: Nose normal.     Mouth/Throat:      Mouth: Mucous membranes are moist.  Eyes:     General: No scleral icterus.    Conjunctiva/sclera: Conjunctivae normal.  Neck:     Thyroid: No thyromegaly.  Cardiovascular:     Heart sounds: No murmur. No friction rub. No gallop.      Comments: Rapid irregular rate Pulmonary:     Breath sounds: No stridor. No wheezing or rales.  Chest:     Chest wall: No tenderness.  Abdominal:     General: There is no distension.     Tenderness: There is no abdominal tenderness. There is no rebound.  Musculoskeletal:        General: Normal range of motion.     Cervical back: Neck supple.  Lymphadenopathy:     Cervical: No cervical adenopathy.  Skin:    Findings: No erythema or rash.  Neurological:  Mental Status: She is alert and oriented to person, place, and time.     Motor: No abnormal muscle tone.     Coordination: Coordination normal.  Psychiatric:        Behavior: Behavior normal.     ED Results / Procedures / Treatments   Labs (all labs ordered are listed, but only abnormal results are displayed) Labs Reviewed  BASIC METABOLIC PANEL - Abnormal; Notable for the following components:      Result Value   Sodium 134 (*)    Glucose, Bld 122 (*)    All other components within normal limits  CBC - Abnormal; Notable for the following components:   RBC 3.75 (*)    All other components within normal limits  TROPONIN I (HIGH SENSITIVITY) - Abnormal; Notable for the following components:   Troponin I (High Sensitivity) 22 (*)    All other components within normal limits  TROPONIN I (HIGH SENSITIVITY) - Abnormal; Notable for the following components:   Troponin I (High Sensitivity) 47 (*)    All other components within normal limits  HEPATIC FUNCTION PANEL    EKG EKG Interpretation  Date/Time:  Friday September 27 2019 19:13:29 EDT Ventricular Rate:  172 PR Interval:    QRS Duration: 78 QT Interval:  274 QTC Calculation: 463 R Axis:   50 Text Interpretation: Atrial fibrillation  with rapid ventricular response Marked ST abnormality, possible inferolateral subendocardial injury Abnormal ECG Confirmed by Milton Ferguson [phone]) on 09/27/2019 7:23:16 PM   Radiology DG Chest Portable 1 View  Result Date: 09/27/2019 CLINICAL DATA:  LEFT chest pain for 30 minutes, feels like heart is racing, history collagen vascular disease, hypertension, atrial fibrillation EXAM: PORTABLE CHEST 1 VIEW COMPARISON:  Portable exam 1951 hours compared to 07/03/2019 FINDINGS: Normal heart size, mediastinal contours, and pulmonary vascularity. Known Atherosclerotic calcification aorta. Lungs clear. Minimal chronic peribronchial thickening noted. No infiltrate, pleural effusion or pneumothorax. Bones demineralized. IMPRESSION: Chronic bronchitic changes without acute infiltrate. Aortic Atherosclerosis (ICD10-I70.0). Electronically Signed   By: Lavonia Dana M.D.   On: 09/27/2019 20:22    Procedures Procedures (including critical care time)  Medications Ordered in ED Medications  sodium chloride flush (NS) 0.9 % injection 3 mL (3 mLs Intravenous Not Given 09/27/19 1955)  diltiazem (CARDIZEM) 125 mg in dextrose 5% 125 mL (1 mg/mL) infusion (5 mg/hr Intravenous Rate/Dose Change 09/27/19 2134)  acetaminophen (TYLENOL) tablet 650 mg (has no administration in time range)    Or  acetaminophen (TYLENOL) suppository 650 mg (has no administration in time range)  ondansetron (ZOFRAN) tablet 4 mg (has no administration in time range)    Or  ondansetron (ZOFRAN) injection 4 mg (has no administration in time range)  diltiazem (CARDIZEM) injection 10 mg (10 mg Intravenous Given 09/27/19 1946)  apixaban (ELIQUIS) tablet 5 mg (5 mg Oral Given 09/27/19 2118)  metoprolol tartrate (LOPRESSOR) injection 5 mg (5 mg Intravenous Given 09/27/19 2120)    ED Course  I have reviewed the triage vital signs and the nursing notes.  Pertinent labs & imaging results that were available during my care of the patient were reviewed by me  and considered in my medical decision making (see chart for details). CRITICAL CARE Performed by: Milton Ferguson Total critical care time: 40 minutes Critical care time was exclusive of separately billable procedures and treating other patients. Critical care was necessary to treat or prevent imminent or life-threatening deterioration. Critical care was time spent personally by me on the following activities: development of treatment  plan with patient and/or surrogate as well as nursing, discussions with consultants, evaluation of patient's response to treatment, examination of patient, obtaining history from patient or surrogate, ordering and performing treatments and interventions, ordering and review of laboratory studies, ordering and review of radiographic studies, pulse oximetry and re-evaluation of patient's condition.    MDM Rules/Calculators/A&P                      Patient with rapid atrial fib.  She is controlled on a Cardizem drip along with some Lopressor.  She will be admitted to medicine      This patient presents to the ED for concern of palpitations this involves an extensive number of treatment options, and is a complaint that carries with it a high risk of complications and morbidity.  The differential diagnosis includes atrial fib PVCs   Lab Tests:   I Ordered, reviewed, and interpreted labs, which included CBC chemistries troponin.  Troponin mildly elevated  Medicines ordered:   I ordered medication Cardizem and Lopressor for atrial fib  Imaging Studies ordered:   I ordered imaging studies which included chest x-ray and  I independently visualized and interpreted imaging which showed no acute disease  Additional history obtained:   Additional history obtained from family member  Previous records obtained and reviewed   Consultations Obtained:   I consulted hospitalist and discussed lab and imaging findings  Reevaluation:  After the interventions  stated above, I reevaluated the patient and found improved  Critical Interventions:  .   Final Clinical Impression(s) / ED Diagnoses Final diagnoses:  None    Rx / DC Orders ED Discharge Orders    None       Milton Ferguson, MD 09/27/19 2311

## 2019-09-27 NOTE — ED Notes (Signed)
Pt daughter is at bedside   Reports pt had an ablation last week   Was told that she might go into and out of a fib for several weeks before she slowed down   Pt HR up and dose changed

## 2019-09-27 NOTE — ED Triage Notes (Signed)
Pt with left CP for past 30 minutes, feels as if heart is racing. Denies sob.

## 2019-09-28 DIAGNOSIS — M1990 Unspecified osteoarthritis, unspecified site: Secondary | ICD-10-CM | POA: Diagnosis present

## 2019-09-28 DIAGNOSIS — I959 Hypotension, unspecified: Secondary | ICD-10-CM | POA: Diagnosis not present

## 2019-09-28 DIAGNOSIS — F419 Anxiety disorder, unspecified: Secondary | ICD-10-CM | POA: Diagnosis present

## 2019-09-28 DIAGNOSIS — Z79899 Other long term (current) drug therapy: Secondary | ICD-10-CM | POA: Diagnosis not present

## 2019-09-28 DIAGNOSIS — G4700 Insomnia, unspecified: Secondary | ICD-10-CM | POA: Diagnosis present

## 2019-09-28 DIAGNOSIS — E785 Hyperlipidemia, unspecified: Secondary | ICD-10-CM | POA: Diagnosis present

## 2019-09-28 DIAGNOSIS — I5032 Chronic diastolic (congestive) heart failure: Secondary | ICD-10-CM | POA: Diagnosis not present

## 2019-09-28 DIAGNOSIS — N8110 Cystocele, unspecified: Secondary | ICD-10-CM | POA: Diagnosis present

## 2019-09-28 DIAGNOSIS — R002 Palpitations: Secondary | ICD-10-CM | POA: Diagnosis present

## 2019-09-28 DIAGNOSIS — I248 Other forms of acute ischemic heart disease: Secondary | ICD-10-CM | POA: Diagnosis present

## 2019-09-28 DIAGNOSIS — R778 Other specified abnormalities of plasma proteins: Secondary | ICD-10-CM | POA: Diagnosis present

## 2019-09-28 DIAGNOSIS — I4891 Unspecified atrial fibrillation: Secondary | ICD-10-CM | POA: Diagnosis not present

## 2019-09-28 DIAGNOSIS — Z7901 Long term (current) use of anticoagulants: Secondary | ICD-10-CM | POA: Diagnosis not present

## 2019-09-28 DIAGNOSIS — Z888 Allergy status to other drugs, medicaments and biological substances status: Secondary | ICD-10-CM | POA: Diagnosis not present

## 2019-09-28 DIAGNOSIS — Z8249 Family history of ischemic heart disease and other diseases of the circulatory system: Secondary | ICD-10-CM | POA: Diagnosis not present

## 2019-09-28 DIAGNOSIS — Z20822 Contact with and (suspected) exposure to covid-19: Secondary | ICD-10-CM | POA: Diagnosis present

## 2019-09-28 DIAGNOSIS — R079 Chest pain, unspecified: Secondary | ICD-10-CM | POA: Diagnosis present

## 2019-09-28 DIAGNOSIS — I4819 Other persistent atrial fibrillation: Secondary | ICD-10-CM | POA: Diagnosis present

## 2019-09-28 DIAGNOSIS — I252 Old myocardial infarction: Secondary | ICD-10-CM | POA: Diagnosis not present

## 2019-09-28 DIAGNOSIS — I10 Essential (primary) hypertension: Secondary | ICD-10-CM | POA: Diagnosis not present

## 2019-09-28 DIAGNOSIS — R001 Bradycardia, unspecified: Secondary | ICD-10-CM | POA: Diagnosis not present

## 2019-09-28 DIAGNOSIS — I480 Paroxysmal atrial fibrillation: Secondary | ICD-10-CM | POA: Diagnosis not present

## 2019-09-28 DIAGNOSIS — T461X5A Adverse effect of calcium-channel blockers, initial encounter: Secondary | ICD-10-CM | POA: Diagnosis not present

## 2019-09-28 DIAGNOSIS — I110 Hypertensive heart disease with heart failure: Secondary | ICD-10-CM | POA: Diagnosis present

## 2019-09-28 LAB — MRSA PCR SCREENING: MRSA by PCR: NEGATIVE

## 2019-09-28 LAB — TROPONIN I (HIGH SENSITIVITY): Troponin I (High Sensitivity): 170 ng/L (ref <18)

## 2019-09-28 LAB — SARS CORONAVIRUS 2 BY RT PCR (HOSPITAL ORDER, PERFORMED IN ~~LOC~~ HOSPITAL LAB): SARS Coronavirus 2: NEGATIVE

## 2019-09-28 MED ORDER — PROPAFENONE HCL 150 MG PO TABS
150.0000 mg | ORAL_TABLET | Freq: Three times a day (TID) | ORAL | Status: DC
  Administered 2019-09-28 – 2019-09-29 (×4): 150 mg via ORAL
  Filled 2019-09-28 (×7): qty 1

## 2019-09-28 MED ORDER — METOPROLOL TARTRATE 25 MG PO TABS
25.0000 mg | ORAL_TABLET | Freq: Two times a day (BID) | ORAL | Status: DC
  Administered 2019-09-28: 25 mg via ORAL
  Filled 2019-09-28: qty 1

## 2019-09-28 MED ORDER — ASPIRIN 81 MG PO CHEW
324.0000 mg | CHEWABLE_TABLET | Freq: Once | ORAL | Status: AC
  Administered 2019-09-28: 324 mg via ORAL
  Filled 2019-09-28: qty 4

## 2019-09-28 MED ORDER — DILTIAZEM HCL-DEXTROSE 125-5 MG/125ML-% IV SOLN (PREMIX)
5.0000 mg/h | INTRAVENOUS | Status: DC
  Administered 2019-09-28: 5 mg/h via INTRAVENOUS
  Filled 2019-09-28 (×2): qty 125

## 2019-09-28 MED ORDER — APIXABAN 5 MG PO TABS
5.0000 mg | ORAL_TABLET | Freq: Two times a day (BID) | ORAL | Status: DC
  Administered 2019-09-28 – 2019-09-29 (×3): 5 mg via ORAL
  Filled 2019-09-28 (×3): qty 1

## 2019-09-28 MED ORDER — HEPARIN (PORCINE) 25000 UT/250ML-% IV SOLN
850.0000 [IU]/h | INTRAVENOUS | Status: DC
  Administered 2019-09-28: 850 [IU]/h via INTRAVENOUS
  Filled 2019-09-28: qty 250

## 2019-09-28 NOTE — Plan of Care (Signed)

## 2019-09-28 NOTE — Plan of Care (Signed)

## 2019-09-28 NOTE — Progress Notes (Signed)
ANTICOAGULATION CONSULT NOTE - Initial Consult  Pharmacy Consult for heparin Indication: atrial fibrillation  Allergies  Allergen Reactions  . Phenobarbital Hives and Swelling  . Tamiflu [Oseltamivir Phosphate] Swelling and Rash  . Depakote [Valproic Acid] Hives and Itching  . Lisinopril Swelling    PT can only take low dosage    Patient Measurements: Height: 5\' 3"  (160 cm) Weight: 57.6 kg (127 lb) IBW/kg (Calculated) : 52.4   Vital Signs: Temp: 98.4 F (36.9 C) (06/04 1911) Temp Source: Oral (06/04 1911) BP: 128/67 (06/05 0100) Pulse Rate: 92 (06/05 0100)  Labs: Recent Labs    09/27/19 1924 09/27/19 2101 09/28/19 0034  HGB 12.1  --   --   HCT 36.2  --   --   PLT 288  --   --   CREATININE 0.65  --   --   TROPONINIHS 22* 47* 170*    Estimated Creatinine Clearance: 47.2 mL/min (by C-G formula based on SCr of 0.65 mg/dL).  Assessment: CC/HPI: 79 yo f presenting with palpitations and chest tightness  PMH: HTN Arthritis afib on apixaban  Anticoag: apixaban pta for afib >> iv hep d/t elevated trop likely d/t RVR  Last apixaban dose 6/4 2118  Renal: SCr 0.65  Heme/Onc: H&H 12.1/36.2, plt 288  Goal of Therapy:  Heparin level 0.3-0.7 units/ml aPTT 66 - 102 seconds Monitor platelets by anticoagulation protocol: Yes   Plan:  Dc apixaban Start heparin IV 850 units/hr at 0800 Initial aPTT 1600 - follow until hep lvl correlates  Daily hep lvl aptt cbc F/u plans to return to apixaban  Barth Kirks, PharmD, BCPS, BCCCP Clinical Pharmacist [phone]  Please check AMION for all [place] numbers  09/28/2019 1:40 AM

## 2019-09-28 NOTE — Consult Note (Addendum)
Cardiology Consultation:   Patient ID: [name] MRN: [id number]; DOB: 10/18/1940  Admit date: 09/27/2019 Date of Consult: 09/28/2019  Primary Care Provider: Caryl Bis, MD [facility] Cardiologist: Rozann Lesches, MD  [facility] Electrophysiologist:  Thompson Grayer, MD    Patient Profile:   Kristen Mckenzie is a 79 y.o. female with a hx of paroxysmal atrial fibrillation, anxiety, arthritis, HTN, elevated troponin 01/2019 (normal cors), possible hypertrophic cardiomyopathy by echo 12/2018 who is being seen today for the evaluation of elevated troponin/atrial fibrillation at the request of Dr. Humphrey Rolls.  History of Present Illness:   Ms. Kristen Mckenzie has been followed by Dr. Domenic Polite and Allred for her atrial fib. She developed this around 04-Aug-2016 after the death of her husband. She failed medical therapy with diltiazem, metoprolol, and flecainide per his notes. She underwent EP study/ablation on 09/19/19 as outlined below. Regarding evaluation for coronary disease, she had a normal nuc in 08/04/16 and cath in 01/2019 (done for troponin of 7916) showing no evidence of CAD. Troponin elevation at that time was felt due to atrial fib. 2D Echo 12/2018 showed normal EF, focal basal septal hypertrophy and hyperdynamic LV function with SAM of the anterior mitral valve leaflet and a dynamic LVOT gradient with a peak of 32 mmHg. Recent pre-ablation CT showed calcium score of zero. She presented to the ER yesterday with CP and palpitations. Chest pain was described as left sided, nonradiating, not worse with anything in particular. She was found to be in rapid atrial fibrillation and was started on diltiazem drip as well as IV Lopressor. Symptoms resolved. She received Eliquis in the ED. However, hsTroponins uptrended from 22-47-170 therefore heparin was initiated. This has since been converted back to Eliquis. Chart prepped then MD to see patient to formally discuss subsequent symptoms and today's  assessment.  Past Medical History:  Diagnosis Date  . Anxiety   . Arthritis   . Cholecystolithiasis   . Collagen vascular disease ([place])   . Cystocele with prolapse   . Hypertension   . PAF (paroxysmal atrial fibrillation) ([place])   . Rectocele     Past Surgical History:  Procedure Laterality Date  . ANTERIOR AND POSTERIOR REPAIR N/A 02/21/2018   Procedure: ANTERIOR (CYSTOCELE) REPAIR;  Surgeon: Bjorn Loser, MD;  Location: [facility];  Service: Urology;  Laterality: N/A;  . ATRIAL FIBRILLATION ABLATION N/A 09/19/2019   Procedure: ATRIAL FIBRILLATION ABLATION;  Surgeon: Thompson Grayer, MD;  Location: [facility];  Service: Cardiovascular;  Laterality: N/A;  . CYSTOSCOPY N/A 02/21/2018   Procedure: CYSTOSCOPY;  Surgeon: Bjorn Loser, MD;  Location: [facility];  Service: Urology;  Laterality: N/A;  . EYE SURGERY Bilateral 08/04/09   cataract extraction   . LAPAROSCOPIC VAGINAL HYSTERECTOMY WITH SALPINGO OOPHORECTOMY Bilateral 02/21/2018   Procedure: LAPAROSCOPIC ASSISTED VAGINAL HYSTERECTOMY WITH SALPINGO OOPHORECTOMY;  Surgeon: Maisie Fus, MD;  Location: [facility];  Service: Gynecology;  Laterality: Bilateral;  Dr. Matilde Sprang to follow  . LEFT HEART CATH AND CORONARY ANGIOGRAPHY N/A 02/07/2019   Procedure: LEFT HEART CATH AND CORONARY ANGIOGRAPHY;  Surgeon: Burnell Blanks, MD;  Location: [place] [facility];  Service: Cardiovascular;  Laterality: N/A;     Home Medications:  Prior to Admission medications   Medication Sig Start Date End Date Taking? Authorizing Provider  acetaminophen (TYLENOL) 325 MG tablet Take 2 tablets (650 mg total) by mouth every 4 (four) hours as needed for headache or mild pain. 09/20/19  Yes Tillery,  Satira Mccallum, PA-C  apixaban (ELIQUIS) 5 MG TABS tablet Take 1 tablet (5 mg total) by mouth 2 (two) times daily. 07/23/19  Yes Satira Sark, MD  atorvastatin (LIPITOR) 40 MG tablet Take 1  tablet (40 mg total) by mouth daily. Patient taking differently: Take 20 mg by mouth daily.  07/12/19  Yes Satira Sark, MD  Calcium Carbonate-Vitamin D (CALCARB 600/D PO) Take 1 tablet by mouth daily.    Yes [provider]  cholecalciferol (VITAMIN D) 1000 units tablet Take 1,000 Units daily by mouth.   Yes [provider]  COD LIVER OIL PO Take 1 capsule by mouth every other day.    Yes [provider]  metoprolol tartrate (LOPRESSOR) 25 MG tablet Take 0.5 tablets (12.5 mg total) by mouth 2 (two) times daily. 07/12/19  Yes Satira Sark, MD  Multiple Vitamin (MULTIVITAMIN WITH MINERALS) TABS tablet Take 1 tablet daily by mouth.   Yes [provider]  pantoprazole (PROTONIX) 40 MG tablet Take 40 mg by mouth daily as needed (heartburn).   Yes [provider]  Potassium 99 MG TABS Take 99 mg by mouth 3 (three) times a week.    Yes [provider]  valsartan (DIOVAN) 40 MG tablet Take 1 tablet (40 mg total) by mouth daily. 07/12/19  Yes Satira Sark, MD  vitamin A 10000 UNIT capsule Take 10,000 Units by mouth every other day.    Yes [provider]  zinc gluconate 50 MG tablet Take 50 mg by mouth every other day.   Yes [provider]  zolpidem (AMBIEN) 5 MG tablet Take 5 mg by mouth at bedtime as needed for sleep.    Yes [provider]    Inpatient Medications: Scheduled Meds: . apixaban  5 mg Oral BID  . atorvastatin  20 mg Oral Daily  . metoprolol tartrate  12.5 mg Oral BID  . sodium chloride flush  3 mL Intravenous Once   Continuous Infusions:  PRN Meds: acetaminophen **OR** acetaminophen, ondansetron **OR** ondansetron (ZOFRAN) IV, pantoprazole, zolpidem  Allergies:    Allergies  Allergen Reactions  . Phenobarbital Hives and Swelling  . Tamiflu [Oseltamivir Phosphate] Swelling and Rash  . Depakote [Valproic Acid] Hives and Itching  . Lisinopril Swelling    PT can only take low dosage     Social History:   Social History   Socioeconomic History  . Marital status: Widowed    Spouse name: Not on file  . Number of children: Not on file  . Years of education: Not on file  . Highest education level: Not on file  Occupational History  . Occupation: retired  Tobacco Use  . Smoking status: Never Smoker  . Smokeless tobacco: Never Used  Substance and Sexual Activity  . Alcohol use: No  . Drug use: No  . Sexual activity: Not on file  Other Topics Concern  . Not on file  Social History Narrative   Lives in [place] alone.  Widowed.   Retired.  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   Social Determinants of Health   Financial Resource Strain: Low Risk   . Difficulty of Paying Living Expenses: Not hard at all  Food Insecurity: No Food Insecurity  . Worried About Charity fundraiser in the Last Year: Never true  . Ran Out of Food in the Last Year: Never true  Transportation Needs: No Transportation Needs  . Lack of Transportation (Medical): No  .  Lack of Transportation (Non-Medical): No  Physical Activity: Sufficiently Active  . Days of Exercise per Week: 7 days  . Minutes of Exercise per Session: 30 min  Stress: No Stress Concern Present  . Feeling of Stress : Not at all  Social Connections: Slightly Isolated  . Frequency of Communication with Friends and Family: More than three times a week  . Frequency of Social Gatherings with Friends and Family: More than three times a week  . Attends Religious Services: More than 4 times per year  . Active Member of Clubs or Organizations: Yes  . Attends Archivist Meetings: More than 4 times per year  . Marital Status: Widowed  Intimate Partner Violence: Not At Risk  . Fear of Current or Ex-Partner: No  . Emotionally Abused: No  . Physically Abused: No  . Sexually Abused: No    Family History:    Family History  Problem Relation Age of Onset  . Hypertension Father      ROS:  Please  see the history of present illness.  All other ROS reviewed and negative.     Physical Exam/Data:   Vitals:   09/28/19 0619 09/28/19 0714 09/28/19 0741 09/28/19 1147  BP: (!) 120/92 127/63  121/70  Pulse: 91 97 88 93  Resp: 19 18 14 20   Temp: 98.3 F (36.8 C) 98 F (36.7 C)  (!) 97.5 F (36.4 C)  TempSrc: Oral Oral  Oral  SpO2: 97% 98% 97% 98%  Weight:      Height:        Intake/Output Summary (Last 24 hours) at 09/28/2019 1427 Last data filed at 09/28/2019 1157 Gross per 24 hour  Intake 514.63 ml  Output 800 ml  Net -285.37 ml   Last 3 Weights 09/27/2019 09/20/2019 09/19/2019  Weight (lbs) 127 lb 130 lb 1.1 oz 126 lb  Weight (kg) 57.607 kg 59 kg 57.153 kg     Body mass index is 22.5 kg/m.  Exam per MD.  EKG:  The EKG was personally reviewed and demonstrates: atrial fibrillation 172bpm with RVR, ST depression/TWI I, II, avL, V2-V6, with elevation in avR - f/u tracing once rate better controlled shows improved ST segments  Telemetry:  Atrial fib with a RVR  Relevant CV Studies: AFib Ablation 09/19/19 SURGEON:James Allred, MD  PREPROCEDURE DIAGNOSES: 1. Paroxysmal atrial fibrillation.   POSTPROCEDURE DIAGNOSES: 1. Paroxysmal atrial fibrillation. 2. 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 Multiple atypical atrial flutter circuits  PROCEDURES: 1. Comprehensive electrophysiologic study. 2. Coronary sinus pacing and recording. 3. Three-dimensional mapping of atrial fibrillation with additional mapping and ablation of a second descrete arrhythmia (ectopic atrial tachycardia) as well as additional ablation within the left atrium for persistent afib 4. Ablation of atrial fibrillation with additional mapping and ablation of a second descrete arrhythmia (ectopic atrial tachycardia) as well as additional ablation within the left atrium for persistent afib 5.  Intracardiac echocardiography. 6. Transseptal puncture of an intact septum. 7.  Arrhythmia induction with pacing with isuprel infusion 8. External cardioversion. 9. Right femoral venous ultrasound guided access    Coronary CT 08/2019 ADDENDUM REPORT: 09/14/2019 13:26  CLINICAL DATA:  79 year old female with atrial fibrillation scheduled for an ablation.  EXAM: Cardiac CT/CTA  TECHNIQUE: The patient was scanned on a Siemens Somatom scanner.  FINDINGS: A 120 kV prospective scan was triggered in the descending thoracic aorta at 111 HU's. Gantry rotation speed was 280 msecs and collimation was .9 mm. 125 mg of PO  Metoprolol and no NTG was given. The 3D data set was reconstructed in 5% intervals of the 60-80 % of the R-R cycle. Diastolic phases were analyzed on a dedicated work station using MPR, MIP and VRT modes. The patient received 80 cc of contrast.  There is normal pulmonary vein drainage into the left atrium (3 on the right and 2 on the left) with ostial measurements as follows:  RUPV: 12.8 x 10.7 mm  RMPV: 9.3 X 7.9 mm  RLPV: 16.7 x 14.1 mm  LUPV: 20.5 x 14.1 mm  LLPV: 13.4 x 13.1 mm  The left atrial appendage is large with chicken wing morphology, 1 major lobe, ostial size 21 x 16 mm and length 34 mm. There is no thrombus in the left atrial appendage.  The esophagus runs in the left atrial midline and is not in the proximity to any of the pulmonary veins.  Aorta: Normal caliber. No dissection, moderate diffuse calcifications in the descending aorta.  Aortic Valve:  Trileaflet.  Mild leaflet calcifications.  Coronary Arteries: Normal coronary origin. Right dominance. The study was performed without use of NTG and insufficient for plaque evaluation. Calcium score is 0.  IMPRESSION: 1. There is normal pulmonary vein drainage into the left atrium.  2. The left atrial appendage is large with chicken wing morphology, 1 major lobe, ostial size 21 x 16 mm and length 34 mm. There is no thrombus in the left atrial  appendage.  3. The esophagus runs in the left atrial midline and is not in the proximity to any of the pulmonary veins.  4. Coronary Arteries: Normal coronary origin. Right dominance. The study was performed without use of NTG and insufficient for plaque evaluation. Calcium score is 0.  5. Significant mitral annular calcifications.   Electronically Signed   By: Ena Dawley   On: 09/14/2019 13:26  2D echo 12/2018  1. Left ventricular ejection fraction, by visual estimation, is 70 to  75%. The left ventricle has hyperdynamic function. Normal left ventricular  posterior wall thickness. There is no left ventricular hypertrophy.  2. Elevated mean left atrial pressure.  3. Left ventricular diastolic Doppler parameters are consistent with  impaired relaxation pattern of LV diastolic filling.  4. There is focal basal septal LV hypertrophy. In the setting of focal  basal septal hypertrophy and hyperdynamic LV function there is SAM of the  anterior mitral valve leaflet and a dynamic LVOT gradient with a peak of  32 mmHg.  5. Global right ventricle has normal systolic function.The right  ventricular size is normal. No increase in right ventricular wall  thickness.  6. Left atrial size was mildly dilated.  7. Right atrial size was normal.  8. Moderate mitral annular calcification.  9. The mitral valve is abnormal. Mild mitral valve regurgitation. No  evidence of mitral stenosis.  10. The tricuspid valve is normal in structure. Tricuspid valve  regurgitation is mild.  11. The aortic valve is tricuspid Aortic valve regurgitation was not  visualized by color flow Doppler. Structurally normal aortic valve, with  no evidence of sclerosis or stenosis.  12. The pulmonic valve was not well visualized. Pulmonic valve  regurgitation is not visualized by color flow Doppler.  13. Mildly elevated pulmonary artery systolic pressure.  14. PASP is borderline elevated at 30 mmHg.       Laboratory Data:  High Sensitivity Troponin:   Recent Labs  Lab 09/27/19 1924 09/27/19 2101 09/28/19 0034  TROPONINIHS 22* 47* 170*  Chemistry Recent Labs  Lab 09/27/19 1924  NA 134*  K 3.8  CL 100  CO2 22  GLUCOSE 122*  BUN 19  CREATININE 0.65  CALCIUM 9.1  GFRNONAA >60  GFRAA >60  ANIONGAP 12    Recent Labs  Lab 09/27/19 1924  PROT 6.9  ALBUMIN 4.3  AST 27  ALT 28  ALKPHOS 85  BILITOT 0.5   Hematology Recent Labs  Lab 09/27/19 1924  WBC 7.3  RBC 3.75*  HGB 12.1  HCT 36.2  MCV 96.5  MCH 32.3  MCHC 33.4  RDW 13.1  PLT 288   BNPNo results for input(s): BNP, PROBNP in the last 168 hours.  DDimer No results for input(s): DDIMER in the last 168 hours.   Radiology/Studies:  DG Chest Portable 1 View  Result Date: 09/27/2019 CLINICAL DATA:  LEFT chest pain for 30 minutes, feels like heart is racing, history collagen vascular disease, hypertension, atrial fibrillation EXAM: PORTABLE CHEST 1 VIEW COMPARISON:  Portable exam 1951 hours compared to 07/03/2019 FINDINGS: Normal heart size, mediastinal contours, and pulmonary vascularity. Known Atherosclerotic calcification aorta. Lungs clear. Minimal chronic peribronchial thickening noted. No infiltrate, pleural effusion or pneumothorax. Bones demineralized. IMPRESSION: Chronic bronchitic changes without acute infiltrate. Aortic Atherosclerosis (ICD10-I70.0). Electronically Signed   By: Lavonia Dana M.D.   On: 09/27/2019 20:22       HEAR Score (for undifferentiated chest pain):  HEAR Score: 3    Assessment and Plan:   1. Recurrent atrial fibrillation with RVR and associated chest pain (recent ablation) 2. Elevated troponin with history of normal coronaries 01/2019 (troponin 7k at that time) 3. Question of hypertrophic cardiomyopathy on prior echocardiogram  Patient chart prepped prior to seeing patient; MD plans to go evaluate patient in person on their own - further recs and exam per MD.  For questions  or updates, please contact [place] Please consult [email or web address] for contact info under   Signed, Charlie Pitter, PA-C  09/28/2019 2:27 PM   EP Attending  Patient seen and examined. Agree with the findings as noted above. On exam she is a well appearing elderly woman, NAD. Lungs are clear and CV reveals a RRR, abdomen is soft and extremities demonstrate no edema. Neuro is non-focal. Her ECG shows atrial fib with a RVR, We will uptitrate her beta blocker and plan to DCCV tomorrow if she has not gone back to [facility] on her own. We also discussed AA drug therapy.   Mikle Bosworth.D.

## 2019-09-28 NOTE — H&P (Addendum)
History and Physical    Kristen Mckenzie ZPH:[id number] DOB: 1940/12/29 DOA: 09/27/2019  PCP: Caryl Bis, MD (Confirm with patient/family/NH records and if not entered, this has to be entered at [facility] point of entry) Patient coming from: Home  I have personally briefly reviewed patient's old medical records in [place]  Chief Complaint: Palpitations and chest tightness  HPI: Kristen Mckenzie is a 79 y.o. female with medical history significant of hypertension, anxiety, arthritis, atrial fibrillation s/p ablation and anterior cystocele s/p repair presented to ED complaining of chest tightness and worsening palpitations.  Patient states that she had ablation done last week for atrial fibrillation and was doing well till today when suddenly she started having severe palpitations with left-sided chest pain.  Patient states that she took her home medications as prescribed but did not help with palpitations and also started having some left sided aching of her chest, 4/10 on pain scale, nonradiating and getting better or worse with nothing.  Patient states that chest pain is resolved at this time.  Patient otherwise denies fever, chills, cough shortness of breath, nausea, vomiting, abdominal pain and urinary symptoms.    ED Course: On arrival to the ED patient had temperature of 98.4, blood pressure 132/113, heart rate 149, respiratory rate 20 and oxygen saturation 96% on room air.  Blood work showed WBC 7.3, hemoglobin 12.1, sodium 134, potassium 3.8, BUN 19, creatinine 0.6, blood glucose 122.  First set of troponin was 22 and the second 1 was 47.  Chest x-ray showed chronic changes and no acute cardiopulmonary problem.  First EKG showed atrial fibrillation with ventricular rate of 172 and marked ST abnormality with possibility of inferior lateral subendocardial injury but the second EKG showed atrial fibrillation with heart rate of 102 after Cardizem drip.  Patient was given a dose of IV  Lopressor, bolus of diltiazem followed by diltiazem drip.  Patient was also given a dose of Eliquis and Tylenol in the ED.  Review of Systems: As per HPI otherwise 10 point review of systems negative.  Unacceptable ROS statements: 10 systems reviewed, Extensive (without elaboration).  Acceptable ROS statements: All others negative, All others reviewed and are negative, and All others unremarkable, with at [place] documented Cant double dip - if using for HPI cant use for ROS  Past Medical History:  Diagnosis Date   Anxiety    Arthritis    Cholecystolithiasis    Collagen vascular disease ([place])    Cystocele with prolapse    Hypertension    PAF (paroxysmal atrial fibrillation) ([place])    Rectocele     Past Surgical History:  Procedure Laterality Date   ANTERIOR AND POSTERIOR REPAIR N/A 02/21/2018   Procedure: ANTERIOR (CYSTOCELE) REPAIR;  Surgeon: Bjorn Loser, MD;  Location: [place];  Service: Urology;  Laterality: N/A;   ATRIAL FIBRILLATION ABLATION N/A 09/19/2019   Procedure: ATRIAL FIBRILLATION ABLATION;  Surgeon: Thompson Grayer, MD;  Location: [place] [facility];  Service: Cardiovascular;  Laterality: N/A;   CYSTOSCOPY N/A 02/21/2018   Procedure: CYSTOSCOPY;  Surgeon: Bjorn Loser, MD;  [facility];  Service: Urology;  Laterality: N/A;   EYE SURGERY Bilateral 2011   cataract extraction    LAPAROSCOPIC VAGINAL HYSTERECTOMY WITH SALPINGO OOPHORECTOMY Bilateral 02/21/2018   Procedure: LAPAROSCOPIC ASSISTED VAGINAL HYSTERECTOMY WITH SALPINGO OOPHORECTOMY;  Surgeon: Maisie Fus, MD;  Location: [facility];  Service: Gynecology;  Laterality: Bilateral;  Dr. Matilde Sprang to follow  LEFT HEART CATH AND CORONARY ANGIOGRAPHY N/A 02/07/2019   Procedure: LEFT HEART CATH AND CORONARY ANGIOGRAPHY;  Surgeon: Burnell Blanks, MD;  Location: [place] [facility];  Service: Cardiovascular;   Laterality: N/A;     reports that she has never smoked. She has never used smokeless tobacco. She reports that she does not drink alcohol or use drugs.  Allergies  Allergen Reactions   Phenobarbital Hives and Swelling   Tamiflu [Oseltamivir Phosphate] Swelling and Rash   Depakote [Valproic Acid] Hives and Itching   Lisinopril Swelling    PT can only take low dosage    Family History  Problem Relation Age of Onset   Hypertension Father     Unacceptable: Noncontributory, unremarkable, or negative. Acceptable: (example)Family history negative for heart disease  Prior to Admission medications   Medication Sig Start Date End Date Taking? Authorizing Provider  acetaminophen (TYLENOL) 325 MG tablet Take 2 tablets (650 mg total) by mouth every 4 (four) hours as needed for headache or mild pain. 09/20/19  Yes Shirley Friar, PA-C  apixaban (ELIQUIS) 5 MG TABS tablet Take 1 tablet (5 mg total) by mouth 2 (two) times daily. 07/23/19  Yes Satira Sark, MD  atorvastatin (LIPITOR) 40 MG tablet Take 1 tablet (40 mg total) by mouth daily. Patient taking differently: Take 20 mg by mouth daily.  07/12/19  Yes Satira Sark, MD  Calcium Carbonate-Vitamin D (CALCARB 600/D PO) Take 1 tablet by mouth daily.    Yes [provider]  cholecalciferol (VITAMIN D) 1000 units tablet Take 1,000 Units daily by mouth.   Yes [provider]  COD LIVER OIL PO Take 1 capsule by mouth every other day.    Yes [provider]  metoprolol tartrate (LOPRESSOR) 25 MG tablet Take 0.5 tablets (12.5 mg total) by mouth 2 (two) times daily. 07/12/19  Yes Satira Sark, MD  Multiple Vitamin (MULTIVITAMIN WITH MINERALS) TABS tablet Take 1 tablet daily by mouth.   Yes [provider]  pantoprazole (PROTONIX) 40 MG tablet Take 40 mg by mouth daily as needed (heartburn).   Yes [provider]  Potassium 99 MG TABS Take 99 mg by mouth 3 (three) times a week.     Yes [provider]  valsartan (DIOVAN) 40 MG tablet Take 1 tablet (40 mg total) by mouth daily. 07/12/19  Yes Satira Sark, MD  vitamin A 10000 UNIT capsule Take 10,000 Units by mouth every other day.    Yes [provider]  zinc gluconate 50 MG tablet Take 50 mg by mouth every other day.   Yes [provider]  zolpidem (AMBIEN) 5 MG tablet Take 5 mg by mouth at bedtime as needed for sleep.    Yes [provider]    Physical Exam: Vitals:   09/27/19 2230 09/27/19 2300 09/27/19 2330 09/28/19 0000  BP: (!) 108/58 111/71 121/63 121/83  Pulse: 83 (!) 104 65 98  Resp: (!) 22 (!) 24 20 (!) 22  Temp:      TempSrc:      SpO2: 96% 97% 97% 94%  Weight:      Height:        Constitutional: NAD, calm, comfortable Vitals:   09/27/19 2230 09/27/19 2300 09/27/19 2330 09/28/19 0000  BP: (!) 108/58 111/71 121/63 121/83  Pulse: 83 (!) 104 65 98  Resp: (!) 22 (!) 24 20 (!) 22  Temp:      TempSrc:      SpO2:  96% 97% 97% 94%  Weight:      Height:        General: Patient is a pleasant 79 year old Caucasian female in no acute distress. Eyes: PERRL, lids and conjunctivae normal ENMT: Mucous membranes are moist. Posterior pharynx clear of any exudate or lesions.Normal dentition.  Neck: normal, supple, no masses, no thyromegaly Respiratory: clear to auscultation bilaterally, no wheezing, no crackles. Normal respiratory effort. No accessory muscle use.  Cardiovascular: Chest is nontender on palpation.  Irregularly irregular heart rhythm with tachycardia.  No murmurs / rubs / gallops. No extremity edema. 2+ pedal pulses. No carotid bruits.  Abdomen: no tenderness, no masses palpated. No hepatosplenomegaly. Bowel sounds positive.  Musculoskeletal: no clubbing / cyanosis. No joint deformity upper and lower extremities. Good ROM, no contractures. Normal muscle tone.  Skin: no rashes, lesions, ulcers. No induration Neurologic: CN 2-12 grossly intact. Sensation  intact, DTR normal. Strength 5/5 in all 4.  Psychiatric: Normal judgment and insight. Alert and oriented x 3. Normal mood.   (Anything < 9 systems with 2 bullets each down codes to level 1) (If patient refuses exam cant bill higher level) (Make sure to document decubitus ulcers present on admission -- if possible -- and whether patient has chronic indwelling catheter at time of admission)  Labs on Admission: I have personally reviewed following labs and imaging studies  CBC: Recent Labs  Lab 09/27/19 1924  WBC 7.3  HGB 12.1  HCT 36.2  MCV 96.5  PLT 702   Basic Metabolic Panel: Recent Labs  Lab 09/27/19 1924  NA 134*  K 3.8  CL 100  CO2 22  GLUCOSE 122*  BUN 19  CREATININE 0.65  CALCIUM 9.1   GFR: Estimated Creatinine Clearance: 47.2 mL/min (by C-G formula based on SCr of 0.65 mg/dL). Liver Function Tests: Recent Labs  Lab 09/27/19 1924  AST 27  ALT 28  ALKPHOS 85  BILITOT 0.5  PROT 6.9  ALBUMIN 4.3   No results for input(s): LIPASE, AMYLASE in the last 168 hours. No results for input(s): AMMONIA in the last 168 hours. Coagulation Profile: No results for input(s): INR, PROTIME in the last 168 hours. Cardiac Enzymes: No results for input(s): CKTOTAL, CKMB, CKMBINDEX, TROPONINI in the last 168 hours. BNP (last 3 results) No results for input(s): PROBNP in the last 8760 hours. HbA1C: No results for input(s): HGBA1C in the last 72 hours. CBG: No results for input(s): GLUCAP in the last 168 hours. Lipid Profile: No results for input(s): CHOL, HDL, LDLCALC, TRIG, CHOLHDL, LDLDIRECT in the last 72 hours. Thyroid Function Tests: No results for input(s): TSH, T4TOTAL, FREET4, T3FREE, THYROIDAB in the last 72 hours. Anemia Panel: No results for input(s): VITAMINB12, FOLATE, FERRITIN, TIBC, IRON, RETICCTPCT in the last 72 hours. Urine analysis: No results found for: COLORURINE, APPEARANCEUR, LABSPEC, [place], GLUCOSEU, [place], BILIRUBINUR, KETONESUR, PROTEINUR,  UROBILINOGEN, NITRITE, LEUKOCYTESUR  Radiological Exams on Admission: DG Chest Portable 1 View  Result Date: 09/27/2019 CLINICAL DATA:  LEFT chest pain for 30 minutes, feels like heart is racing, history collagen vascular disease, hypertension, atrial fibrillation EXAM: PORTABLE CHEST 1 VIEW COMPARISON:  Portable exam 1951 hours compared to 07/03/2019 FINDINGS: Normal heart size, mediastinal contours, and pulmonary vascularity. Known Atherosclerotic calcification aorta. Lungs clear. Minimal chronic peribronchial thickening noted. No infiltrate, pleural effusion or pneumothorax. Bones demineralized. IMPRESSION: Chronic bronchitic changes without acute infiltrate. Aortic Atherosclerosis (ICD10-I70.0). Electronically Signed   By: Lavonia Dana M.D.   On: 09/27/2019 20:22      Assessment/Plan Principal  Problem:   Atrial fibrillation with RVR (HCC) Continue diltiazem drip and titrate as needed.  Plan is to discontinue Cardizem drip once the ventricular rate becomes stable and patient will be started on home medications. Patient admitted to stepdown unit with continuous telemetry monitoring. Continue home Eliquis for anticoagulation.   Active Problems: Elevated troponin Elevated troponin is most likely secondary to demand ischemia from rapid ventricular rate.  Continue to monitor.  Patient was complaining of mild chest tightness that is completely resolved.    Essential hypertension Blood pressure on arrival to the hospital was 132/113 but the blood pressure at this time is 118/75.  We will continue the home blood pressure medications and continue to monitor the blood pressure.  Anxiety Patient has a history of anxiety but denies any symptoms of anxiety at this time.  Continue to monitor.  Insomnia Patient was given home dose of Ambien for insomnia.    DVT prophylaxis: Eliquis 5 mg twice daily Code Status: Full code Family Communication: Patient's daughter present at the bedside Disposition  Plan:  Consults called: None Admission status: Observation/stepdown  This is addendum to my H&P.  Patient initially came with atrial fibrillation with RVR and mild chest discomfort.  She was started on Cardizem drip and chest pain completely resolved but troponins continued to trend up and EKG did not show any ST elevation changes.  First set of troponin was 22, followed by 47 and the third was 170.  Patient is chronically on Eliquis at home therefore pharmacy contacted for heparin consult.  Patient needs evaluation by cardiology.  Patient will be admitted to [facility] because no allergy services at  [place] MD [facility] Pager [phone]   If 7PM-7AM, please contact night-coverage [email or web address] Password [facility]  09/28/2019, 12:34 AM

## 2019-09-28 NOTE — Progress Notes (Signed)
   09/28/19 1540  Assess: MEWS Score  Temp (!) 97.4 F (36.3 C)  BP 126/63  Pulse Rate (!) 145  ECG Heart Rate (!) 144  Resp 18  SpO2 95 %  O2 Device Room Air  Assess: MEWS Score  MEWS Temp 0  MEWS Systolic 0  MEWS Pulse 3  MEWS RR 0  MEWS LOC 0  MEWS Score 3  MEWS Score Color Yellow  Assess: if the MEWS score is Yellow or Red  Were vital signs taken at a resting state? Yes  Focused Assessment Documented focused assessment  Early Detection of Sepsis Score *See Row Information* Low  MEWS guidelines implemented *See Row Information* Yes  Treat  MEWS Interventions Administered scheduled meds/treatments  Take Vital Signs  Increase Vital Sign Frequency  Yellow: Q 2hr X 2 then Q 4hr X 2, if remains yellow, continue Q 4hrs  Notify: Provider  Provider Name/Title Shelly Coss  Date Provider Notified 09/28/19  Time Provider Notified 1545  Notification Type Page  Notification Reason Change in status  Response See new orders  Date of Provider Response 09/28/19  Time of Provider Response 1550  Document  Patient Outcome Not stable and remains on department  Progress note created (see row info) Yes

## 2019-09-28 NOTE — Progress Notes (Signed)
PROGRESS NOTE    Kristen Mckenzie  YSA:[id number] DOB: February 24, 1941 DOA: 09/27/2019 PCP: Caryl Bis, MD   Brief Narrative: Patient is a 79 year old female with history of hypertension, anxiety, arthritis, atrial fibrillation status post ablation who presented to the emergency room with complaint of chest pain/palpitations.  She had ablation done last week for atrial fibrillation.  She reported developing severe palpitation with left-sided chest pain.  On presentation, she was tachycardic with heart rate in the range of 140s otherwise hemodynamically stable.  Troponin was mildly elevated.  Chest x-ray showed chronic changes.  EKG showed A. fib.  She was started on Cardizem drip.  This morning her heart rate was below 100.  Electrophysiology saw her today and increased the dose of metoprolol.  Plan for DCCV if she does not revert to normal sinus rhythm by tomorrow.  Assessment & Plan:   Principal Problem:   Atrial fibrillation with RVR (HCC) Active Problems:   Essential hypertension   Elevated troponin   Chest pain  A. fib with RVR: Presented with A. fib with RVR.  Status post ablation done a week ago for A. fib.  She was started  on Cardizem drip.  Started on heparin for  anticoagulation.  She takes Eliquis at home, which we will resume. This morning her heart rate was below 100.  Cardizem drip  stopped.  Electrophysiology saw her today and increased the dose of metoprolol.  Plan for DCCV if she does not revert to normal sinus rhythm by tomorrow.  Elevated troponin: Most likely due to demand ischemia from rapid ventricular rate.  She also had left-sided mild chest tightness which has resolved.  Troponin is mild elevated.  Anxiety: Continue her home medicines  Hypertension: Blood pressure  is stable.  Continue current medications  Hyperlipidemia: On Lipitor at home.  Insomnia: On zolpidem         DVT prophylaxis:Heparin Code Status: Full Family Communication: Called daughter  Suanne Marker on phone today on 09/28/19 Status is: Inpatient  Remains inpatient appropriate because:IV treatments appropriate due to intensity of illness or inability to take PO   Dispo: The patient is from: Home              Anticipated d/c is to: Home              Anticipated d/c date is: 1 day              Patient currently is not medically stable to d/c.    Consultants: Cardiology  Procedures:None  Antimicrobials:  Anti-infectives (From admission, onward)   None      Subjective: Patient seen and examined at the bedside this morning.hemodynamically stable.  Heart rate less than 100 this morning but she was still in A. fib.  Denies any chest pain or shortness of breath and was eager for discharge.  Objective: Vitals:   09/28/19 0500 09/28/19 0619 09/28/19 0714 09/28/19 0741  BP: 128/68 (!) 120/92 127/63   Pulse: 88 91 97 88  Resp: (!) 24 19 18 14   Temp:  98.3 F (36.8 C) 98 F (36.7 C)   TempSrc:  Oral Oral   SpO2: 96% 97% 98% 97%  Weight:      Height:        Intake/Output Summary (Last 24 hours) at 09/28/2019 0754 Last data filed at 09/27/2019 2316 Gross per 24 hour  Intake 21.4 ml  Output --  Net 21.4 ml   Filed Weights   09/27/19 1911  Weight: 57.6  kg    Examination:  General exam: Appears calm and comfortable ,Not in distress, pleasant elderly female HEENT:PERRL,Oral mucosa moist, Ear/Nose normal on gross exam Respiratory system: Bilateral equal air entry, normal vesicular breath sounds, no wheezes or crackles  Cardiovascular system: Irregularly irregular rhythm. No JVD, murmurs, rubs, gallops or clicks. No pedal edema. Gastrointestinal system: Abdomen is nondistended, soft and nontender. No organomegaly or masses felt. Normal bowel sounds heard. Central nervous system: Alert and oriented. No focal neurological deficits. Extremities: No edema, no clubbing ,no cyanosis, distal peripheral pulses palpable. Skin: No rashes, lesions or ulcers,no icterus ,no  pallor   Data Reviewed: I have personally reviewed following labs and imaging studies  CBC: Recent Labs  Lab 09/27/19 1924  WBC 7.3  HGB 12.1  HCT 36.2  MCV 96.5  PLT 664   Basic Metabolic Panel: Recent Labs  Lab 09/27/19 1924  NA 134*  K 3.8  CL 100  CO2 22  GLUCOSE 122*  BUN 19  CREATININE 0.65  CALCIUM 9.1   GFR: Estimated Creatinine Clearance: 47.2 mL/min (by C-G formula based on SCr of 0.65 mg/dL). Liver Function Tests: Recent Labs  Lab 09/27/19 1924  AST 27  ALT 28  ALKPHOS 85  BILITOT 0.5  PROT 6.9  ALBUMIN 4.3   No results for input(s): LIPASE, AMYLASE in the last 168 hours. No results for input(s): AMMONIA in the last 168 hours. Coagulation Profile: No results for input(s): INR, PROTIME in the last 168 hours. Cardiac Enzymes: No results for input(s): CKTOTAL, CKMB, CKMBINDEX, TROPONINI in the last 168 hours. BNP (last 3 results) No results for input(s): PROBNP in the last 8760 hours. HbA1C: No results for input(s): HGBA1C in the last 72 hours. CBG: No results for input(s): GLUCAP in the last 168 hours. Lipid Profile: No results for input(s): CHOL, HDL, LDLCALC, TRIG, CHOLHDL, LDLDIRECT in the last 72 hours. Thyroid Function Tests: No results for input(s): TSH, T4TOTAL, FREET4, T3FREE, THYROIDAB in the last 72 hours. Anemia Panel: No results for input(s): VITAMINB12, FOLATE, FERRITIN, TIBC, IRON, RETICCTPCT in the last 72 hours. Sepsis Labs: No results for input(s): PROCALCITON, LATICACIDVEN in the last 168 hours.  Recent Results (from the past 240 hour(s))  SARS Coronavirus 2 by RT PCR (hospital order, performed in [facility] hospital lab) Nasopharyngeal Nasopharyngeal Swab     Status: None   Collection Time: 09/27/19 11:10 PM   Specimen: Nasopharyngeal Swab  Result Value Ref Range Status   SARS Coronavirus 2 NEGATIVE NEGATIVE Final    Comment: (NOTE) SARS-CoV-2 target nucleic acids are NOT DETECTED. The SARS-CoV-2 RNA is generally  detectable in upper and lower respiratory specimens during the acute phase of infection. The lowest concentration of SARS-CoV-2 viral copies this assay can detect is 250 copies / mL. A negative result does not preclude SARS-CoV-2 infection and should not be used as the sole basis for treatment or other patient management decisions.  A negative result may occur with improper specimen collection / handling, submission of specimen other than nasopharyngeal swab, presence of viral mutation(s) within the areas targeted by this assay, and inadequate number of viral copies (<250 copies / mL). A negative result must be combined with clinical observations, patient history, and epidemiological information. Fact Sheet for Patients:   StrictlyIdeas.no Fact Sheet for Healthcare Providers: BankingDealers.co.za This test is not yet approved or cleared  by the [place] FDA and has been authorized for detection and/or diagnosis of SARS-CoV-2 by FDA under an Emergency Use Authorization (EUA).  This  EUA will remain in effect (meaning this test can be used) for the duration of the COVID-19 declaration under Section 564(b)(1) of the Act, 21 U.S.C. section 360bbb-3(b)(1), unless the authorization is terminated or revoked sooner. Performed at [facility], [address] [place], [place] [address]          Radiology Studies: DG Chest Portable 1 View  Result Date: 09/27/2019 CLINICAL DATA:  LEFT chest pain for 30 minutes, feels like heart is racing, history collagen vascular disease, hypertension, atrial fibrillation EXAM: PORTABLE CHEST 1 VIEW COMPARISON:  Portable exam 1951 hours compared to 07/03/2019 FINDINGS: Normal heart size, mediastinal contours, and pulmonary vascularity. Known Atherosclerotic calcification aorta. Lungs clear. Minimal chronic peribronchial thickening noted. No infiltrate, pleural effusion or pneumothorax. Bones demineralized.  IMPRESSION: Chronic bronchitic changes without acute infiltrate. Aortic Atherosclerosis (ICD10-I70.0). Electronically Signed   By: Lavonia Dana M.D.   On: 09/27/2019 20:22        Scheduled Meds: . atorvastatin  20 mg Oral Daily  . metoprolol tartrate  12.5 mg Oral BID  . sodium chloride flush  3 mL Intravenous Once   Continuous Infusions: . diltiazem (CARDIZEM) infusion 5 mg/hr (09/27/19 2316)  . heparin       LOS: 0 days    Time spent:25 mins. More than 50% of that time was spent in counseling and/or coordination of care.      Shelly Coss, MD [facility] P6/08/2019, 7:54 AM

## 2019-09-28 NOTE — ED Notes (Signed)
Date and time results received: 09/28/19 1:18 AM  Test: Troponin Critical Value: 170  Name of Provider Notified: Dr. Humphrey Rolls  Orders Received? Or Actions Taken?: Actions Taken: n/a

## 2019-09-28 NOTE — Progress Notes (Signed)
[place] for heparin > back to apixaban Indication: atrial fibrillation  Allergies  Allergen Reactions  . Phenobarbital Hives and Swelling  . Tamiflu [Oseltamivir Phosphate] Swelling and Rash  . Depakote [Valproic Acid] Hives and Itching  . Lisinopril Swelling    PT can only take low dosage    Patient Measurements: Height: 5\' 3"  (160 cm) Weight: 57.6 kg (127 lb) IBW/kg (Calculated) : 52.4   Vital Signs: Temp: 98 F (36.7 C) (06/05 0714) Temp Source: Oral (06/05 0714) BP: 127/63 (06/05 0714) Pulse Rate: 88 (06/05 0741)  Labs: Recent Labs    09/27/19 1924 09/27/19 2101 09/28/19 0034  HGB 12.1  --   --   HCT 36.2  --   --   PLT 288  --   --   CREATININE 0.65  --   --   TROPONINIHS 22* 47* 170*    Estimated Creatinine Clearance: 47.2 mL/min (by C-G formula based on SCr of 0.65 mg/dL).  Assessment: CC/HPI: 79 yo f presenting with palpitations and chest tightness  PMH: HTN Arthritis afib on apixaban  Anticoag: apixaban pta for afib >> iv hep d/t elevated trop likely d/t RVR  Last apixaban dose 6/4 2118  Renal: SCr 0.65  Heme/Onc: H&H 12.1/36.2, plt 288  Pharmacy asked to change back to Apixaban 6/5 AM.  Goal of Therapy:  Monitor platelets by anticoagulation protocol: Yes   Plan:  Stop heparin Resume apixaban 5 mg bid.  [place] Crane, Roylene Reason, [facility] Clinical Pharmacist  09/28/2019 10:34 AM   [facility] pharmacy phone numbers are listed on [email or web address]

## 2019-09-29 ENCOUNTER — Inpatient Hospital Stay (HOSPITAL_COMMUNITY): Payer: Medicare Other | Admitting: Anesthesiology

## 2019-09-29 ENCOUNTER — Encounter (HOSPITAL_COMMUNITY)
Admission: EM | Disposition: A | Discharge status: Home / Self Care | Payer: Self-pay | Source: Home / Self Care | Attending: Internal Medicine

## 2019-09-29 DIAGNOSIS — I5032 Chronic diastolic (congestive) heart failure: Secondary | ICD-10-CM

## 2019-09-29 HISTORY — PX: CARDIOVERSION: SHX1299

## 2019-09-29 LAB — APTT: aPTT: 32 seconds (ref 24–36)

## 2019-09-29 LAB — CBC
HCT: 35.3 % — ABNORMAL LOW (ref 36.0–46.0)
Hemoglobin: 11.8 g/dL — ABNORMAL LOW (ref 12.0–15.0)
MCH: 31.8 pg (ref 26.0–34.0)
MCHC: 33.4 g/dL (ref 30.0–36.0)
MCV: 95.1 fL (ref 80.0–100.0)
Platelets: 256 10*3/uL (ref 150–400)
RBC: 3.71 MIL/uL — ABNORMAL LOW (ref 3.87–5.11)
RDW: 13 % (ref 11.5–15.5)
WBC: 11.3 10*3/uL — ABNORMAL HIGH (ref 4.0–10.5)
nRBC: 0 % (ref 0.0–0.2)

## 2019-09-29 LAB — HEPARIN LEVEL (UNFRACTIONATED): Heparin Unfractionated: >2.2 IU/mL — ABNORMAL HIGH (ref 0.30–0.70)

## 2019-09-29 SURGERY — CARDIOVERSION
Anesthesia: General

## 2019-09-29 MED ORDER — ATROPINE SULFATE 1 MG/10ML IJ SOSY
PREFILLED_SYRINGE | INTRAMUSCULAR | Status: AC
  Administered 2019-09-29: 0.5 mg via INTRAVENOUS
  Filled 2019-09-29: qty 10

## 2019-09-29 MED ORDER — DOPAMINE-DEXTROSE 3.2-5 MG/ML-% IV SOLN
2.0000 ug/kg/min | INTRAVENOUS | Status: DC
  Administered 2019-09-29: 5 ug/kg/min via INTRAVENOUS
  Filled 2019-09-29: qty 250

## 2019-09-29 MED ORDER — METOPROLOL TARTRATE 25 MG PO TABS: 25.0000 mg | ORAL_TABLET | Freq: Two times a day (BID) | ORAL | 1 refills | Status: DC

## 2019-09-29 MED ORDER — SODIUM CHLORIDE 0.9 % IV BOLUS
1000.0000 mL | Freq: Once | INTRAVENOUS | Status: AC
  Administered 2019-09-29: 1000 mL via INTRAVENOUS

## 2019-09-29 MED ORDER — ATROPINE SULFATE 1 MG/10ML IJ SOSY
1.0000 mg | PREFILLED_SYRINGE | Freq: Once | INTRAMUSCULAR | Status: AC
  Administered 2019-09-29: 0.5 mg via INTRAVENOUS

## 2019-09-29 MED ORDER — ATORVASTATIN CALCIUM 40 MG PO TABS: 20.0000 mg | ORAL_TABLET | Freq: Every day | ORAL | Status: DC

## 2019-09-29 MED ORDER — SODIUM CHLORIDE 0.9 % IV BOLUS
250.0000 mL | Freq: Once | INTRAVENOUS | Status: AC
  Administered 2019-09-29: 250 mL via INTRAVENOUS

## 2019-09-29 MED ORDER — SODIUM CHLORIDE 0.9 % IV SOLN: INTRAVENOUS | Status: DC | PRN

## 2019-09-29 MED ORDER — ACETAMINOPHEN 325 MG PO TABS: 650.0000 mg | ORAL_TABLET | ORAL | Status: DC | PRN

## 2019-09-29 MED ORDER — PHENYLEPHRINE HCL (PRESSORS) 10 MG/ML IV SOLN
INTRAVENOUS | Status: DC | PRN
Start: 2019-09-29 — End: 2019-09-29
  Administered 2019-09-29: 40 ug via INTRAVENOUS

## 2019-09-29 MED ORDER — PROPAFENONE HCL 150 MG PO TABS: 150.0000 mg | ORAL_TABLET | Freq: Three times a day (TID) | ORAL | 1 refills | Status: DC

## 2019-09-29 MED ORDER — PROPOFOL 10 MG/ML IV BOLUS
INTRAVENOUS | Status: DC | PRN
  Administered 2019-09-29: 30 mg via INTRAVENOUS
  Administered 2019-09-29: 25 mg via INTRAVENOUS

## 2019-09-29 MED ORDER — LIDOCAINE 2% (20 MG/ML) 5 ML SYRINGE
INTRAMUSCULAR | Status: DC | PRN
  Administered 2019-09-29: 100 mg via INTRAVENOUS

## 2019-09-29 MED ORDER — ONDANSETRON HCL 4 MG/2ML IJ SOLN: 4.0000 mg | Freq: Four times a day (QID) | INTRAMUSCULAR | Status: DC | PRN

## 2019-09-29 NOTE — Progress Notes (Signed)
Patient had HR sustaining in 30 and 40's. B/P 60's/40's Cardiology called, Cardizem turned off, NS 1063ml bolus started. Atropine 0.5mg  given at 0018 and another Atropine 0.5mg  given at 0029. Slight improvement in HR. Dr. Neena Rhymes arrived to see patient and ordered Dopamine to start at 25mcg. Dopamine started at 0039, 0045 Dopamine turned down to 7.39mcg, and then down to 51mcg at 0047. Patient alert and symtomatic during this episode. Patient 's condition improved with NS and Dopamine infusing. [facility] was notified of events and Waunita Schooner came to see patient. Patient remains in Afib, but feeling better, symptoms subsided. Will continue to monitor patient closely.

## 2019-09-29 NOTE — Plan of Care (Signed)

## 2019-09-29 NOTE — Progress Notes (Signed)
   09/29/19 0715  Assess: MEWS Score  Temp 97.8 F (36.6 C)  BP 92/73  Pulse Rate (!) 136  ECG Heart Rate (!) 134  Resp 20  SpO2 90 %  O2 Device Room Air  Patient Activity (if Appropriate) In bed  Assess: MEWS Score  MEWS Temp 0  MEWS Systolic 1  MEWS Pulse 3  MEWS RR 0  MEWS LOC 0  MEWS Score 4  MEWS Score Color Red  Assess: if the MEWS score is Yellow or Red  Were vital signs taken at a resting state? Yes  Focused Assessment Documented focused assessment  Early Detection of Sepsis Score *See Row Information* Low  MEWS guidelines implemented *See Row Information* No, previously yellow, continue vital signs every 4 hours  Take Vital Signs  Increase Vital Sign Frequency  Red: Q 1hr X 4 then Q 4hr X 4, if remains red, continue Q 4hrs  Escalate  MEWS: Escalate Red: discuss with charge nurse/RN and provider, consider discussing with RRT  Notify: Charge Nurse/RN  Name of Charge Nurse/RN Notified Bernadette RN  Date Charge Nurse/RN Notified 09/29/19  Time Charge Nurse/RN Notified 0725  Notify: Provider  Provider Name/Title Shelly Coss  Date Provider Notified 09/29/19  Time Provider Notified [phone]  Notification Type Page  Notification Reason Other (Comment) (updating pt's status)  Response No new orders  Date of Provider Response 09/29/19  Time of Provider Response [phone]  Notify: Rapid Response  Name of Rapid Response RN Notified Waunita Schooner RN  Date Rapid Response Notified 09/29/19  Time Rapid Response Notified 0730  Document  Patient Outcome Not stable and remains on department  Progress note created (see row info) Yes

## 2019-09-29 NOTE — Anesthesia Preprocedure Evaluation (Signed)
Anesthesia Evaluation    Reviewed: Allergy & Precautions, Patient's Chart, lab work & pertinent test results, Unable to perform ROS - Chart review only  History of Anesthesia Complications Negative for: history of anesthetic complications  Airway Mallampati: I  TM Distance: >3 FB Neck ROM: Full    Dental no notable dental hx. (+) Teeth Intact, Caps, Dental Advisory Given   Pulmonary neg pulmonary ROS,    Pulmonary exam normal breath sounds clear to auscultation       Cardiovascular hypertension, Pt. on medications + Past MI  Normal cardiovascular exam+ dysrhythmias Atrial Fibrillation  Rhythm:Regular Rate:Normal  Coronary Arteries: Normal coronary origin. Right dominance. The study was performed without use of NTG and insufficient for plaque evaluation. Calcium score is 0.  5. Significant mitral annular calcifications.   Neuro/Psych PSYCHIATRIC DISORDERS Anxiety negative neurological ROS     GI/Hepatic negative GI ROS, Neg liver ROS,   Endo/Other  negative endocrine ROS  Renal/GU negative Renal ROS     Musculoskeletal  (+) Arthritis ,   Abdominal Normal abdominal exam  (+)   Peds  Hematology negative hematology ROS (+)   Anesthesia Other Findings   Reproductive/Obstetrics                             Anesthesia Physical  Anesthesia Plan  ASA: III  Anesthesia Plan: General   Post-op Pain Management:    Induction: Intravenous  PONV Risk Score and Plan: 4 or greater and Treatment may vary due to age or medical condition, Propofol infusion and TIVA  Airway Management Planned: Mask  Additional Equipment: None  Intra-op Plan:   Post-operative Plan:   Informed Consent:   Plan Discussed with: CRNA and Anesthesiologist  Anesthesia Plan Comments:         Anesthesia Quick Evaluation

## 2019-09-29 NOTE — Progress Notes (Signed)
Progress Note  Patient Name: Kristen Mckenzie Date of Encounter: 09/29/2019  Primary Cardiologist: Rozann Lesches, MD   Subjective   Note low bp overnight. She remains in atrial fib with a RVR. BP improved.  Inpatient Medications    Scheduled Meds: . apixaban  5 mg Oral BID  . atorvastatin  20 mg Oral Daily  . metoprolol tartrate  25 mg Oral BID  . propafenone  150 mg Oral Q8H  . sodium chloride flush  3 mL Intravenous Once   Continuous Infusions: . diltiazem (CARDIZEM) infusion Stopped (09/29/19 0000)   PRN Meds: acetaminophen **OR** acetaminophen, ondansetron **OR** ondansetron (ZOFRAN) IV, pantoprazole, zolpidem   Vital Signs    Vitals:   09/29/19 0715 09/29/19 0730 09/29/19 0800 09/29/19 0809  BP: 92/73 (!) 91/55 (!) 82/61 (!) 87/64  Pulse: (!) 136 (!) 104 (!) 52 (!) 116  Resp: 20 (!) 22 (!) 22 20  Temp: 97.8 F (36.6 C)     TempSrc: Oral     SpO2: 90% 94% 95% 97%  Weight:      Height:        Intake/Output Summary (Last 24 hours) at 09/29/2019 0827 Last data filed at 09/29/2019 0600 Gross per 24 hour  Intake 593.51 ml  Output 4150 ml  Net -3556.49 ml   Filed Weights   09/27/19 1911  Weight: 57.6 kg    Telemetry    Atrial fib with a RVR - Personally Reviewed  ECG    none - Personally Reviewed  Physical Exam   GEN: No acute distress.   Neck: No JVD Cardiac: IRIRR, no murmurs, rubs, or gallops.  Respiratory: Clear to auscultation bilaterally. GI: Soft, nontender, non-distended  MS: No edema; No deformity. Neuro:  Nonfocal  Psych: Normal affect   Labs    Chemistry Recent Labs  Lab 09/27/19 1924  NA 134*  K 3.8  CL 100  CO2 22  GLUCOSE 122*  BUN 19  CREATININE 0.65  CALCIUM 9.1  PROT 6.9  ALBUMIN 4.3  AST 27  ALT 28  ALKPHOS 85  BILITOT 0.5  GFRNONAA >60  GFRAA >60  ANIONGAP 12     Hematology Recent Labs  Lab 09/27/19 1924 09/29/19 0534  WBC 7.3 11.3*  RBC 3.75* 3.71*  HGB 12.1 11.8*  HCT 36.2 35.3*  MCV 96.5 95.1   MCH 32.3 31.8  MCHC 33.4 33.4  RDW 13.1 13.0  PLT 288 256    Cardiac EnzymesNo results for input(s): TROPONINI in the last 168 hours. No results for input(s): TROPIPOC in the last 168 hours.   BNPNo results for input(s): BNP, PROBNP in the last 168 hours.   DDimer No results for input(s): DDIMER in the last 168 hours.   Radiology    DG Chest Portable 1 View  Result Date: 09/27/2019 CLINICAL DATA:  LEFT chest pain for 30 minutes, feels like heart is racing, history collagen vascular disease, hypertension, atrial fibrillation EXAM: PORTABLE CHEST 1 VIEW COMPARISON:  Portable exam 1951 hours compared to 07/03/2019 FINDINGS: Normal heart size, mediastinal contours, and pulmonary vascularity. Known Atherosclerotic calcification aorta. Lungs clear. Minimal chronic peribronchial thickening noted. No infiltrate, pleural effusion or pneumothorax. Bones demineralized. IMPRESSION: Chronic bronchitic changes without acute infiltrate. Aortic Atherosclerosis (ICD10-I70.0). Electronically Signed   By: Lavonia Dana M.D.   On: 09/27/2019 20:22    Cardiac Studies   none  Patient Profile     79 y.o. female admitted with atrial fib with a RVR and sob  Assessment &  Plan    1. Symptomatic atrial fib with a RVR - I have discussed DCCV with the patient and she wishes to proceed. She has not missed any doses of her systemic anti-coagulation. Discussed with Dr. Greggory Brandy yesterday. We will continue low dose propafenone.  2. Chronic diastolic heart failure - she remains sob. She will hopefully improve after DCCV     For questions or updates, please contact [place] Please consult [email or web address] for contact info under Cardiology/STEMI.      Signed, Cristopher Peru, MD  09/29/2019, 8:27 AM  Patient ID: [name], female   DOB: 09/18/1940, 79 y.o.   MRN: [id number]

## 2019-09-29 NOTE — Significant Event (Signed)
Cardiology Moon[place] Note  Called by care nurse about bradycardia and hypotension. Patient's HR's in the 30's and BP 60's/40's. Patient very symptomatic with this. Still in AF. Was on diltiazem drip at the time. Dilt discontinued with no improvement. Given 1L fluid bolus with no improvement. Given 0.5mg  atropine x1 and 1mg  atropine x1 with little to no sustained improvement. Started on dopamine drip first at 55mcg with significant improvement in HR's (to 90's) and SBP (to 100's). Decreased dopamine drip to 73mcg. Will keep on low dose dopamine tonight to help sustain HR and BP while diltiazem wears off.   Marcie Mowers, MD Cardiology Fellow, PGY-7

## 2019-09-29 NOTE — Anesthesia Procedure Notes (Signed)
Procedure Name: General with mask airway Date/Time: 09/29/2019 9:10 AM Performed by: Renato Shin, CRNA Pre-anesthesia Checklist: Patient identified, Emergency Drugs available, Suction available and Patient being monitored Patient Re-evaluated:Patient Re-evaluated prior to induction Oxygen Delivery Method: Nasal cannula and Ambu bag Preoxygenation: Pre-oxygenation with 100% oxygen Induction Type: IV induction Ventilation: Mask ventilation without difficulty Placement Confirmation: positive ETCO2 and breath sounds checked- equal and bilateral Dental Injury: Teeth and Oropharynx as per pre-operative assessment

## 2019-09-29 NOTE — Discharge Summary (Signed)
Physician Discharge Summary  Kristen Mckenzie:[id number] DOB: [birth year] DOA: 09/27/2019  PCP: Caryl Bis, MD  Admit date: 09/27/2019 Discharge date: 09/29/2019  Admitted From: Home Disposition:  Home  Discharge Condition:Stable CODE STATUS:FULL Diet recommendation: Heart Healthy      Brief/Interim Summary:  Patient is a 79 year old female with history of hypertension, anxiety, arthritis, atrial fibrillation status post ablation who presented to the emergency room with complaint of chest pain/palpitations.  She had ablation done last week for atrial fibrillation.  She reported developing severe palpitation with left-sided chest pain.  On presentation, she was tachycardic with heart rate in the range of 140s otherwise hemodynamically stable.  Troponin was mildly elevated.  Chest x-ray showed chronic changes.  EKG showed A. fib.  She was started on Cardizem drip on admission .Hospital course remarkable for A. fib with RVR and bradycardia/hypotension.  She underwent  DCCV cardioversion today and currently she is maintaining normal sinus rhythm.  She is hemodynamically stable for discharge today to home.  Following problems were addressed during her hospitalization:   A. fib with RVR: Presented with A. fib with RVR.  Status post ablation done a week ago for A. fib.  She was started  on Cardizem drip.  Started on heparin for  anticoagulation.  She takes Eliquis at home, which we have  resumed. Her heart rate was well controlled yesterday but yesterday afternoon she went into A. fib with RVR and had to be started on Cardizem drip which made her hypotensive, bradycardic and she had to be given IV fluids. She underwent  DCCV cardioversion today.  Currently her heart rate is well controlled and is in sinus.  She has been started on metoprolol and propafenone.  EP was following.  Elevated troponin: Most likely due to demand ischemia from rapid ventricular rate.  She also had left-sided mild  chest tightness which has resolved.  Troponin is mildly elevated.  Anxiety: Continue her home medicines  Hypertension: Now the Blood pressure  is stable.  Continue current medications  Hyperlipidemia: On Lipitor at home.  Insomnia: On zolpidem    Discharge Diagnoses:  Principal Problem:   Atrial fibrillation with RVR ([place]) Active Problems:   Essential hypertension   Elevated troponin   Chest pain    Discharge Instructions  Discharge Instructions    Diet - low sodium heart healthy   Complete by: As directed    Discharge instructions   Complete by: As directed    1)Please take prescribed medications as instructed. 2)Follow up with Dr Domenic Polite on 10/17/19 at 1:20 pm   Increase activity slowly   Complete by: As directed      Allergies as of 09/29/2019      Reactions   Phenobarbital Hives, Swelling   Tamiflu [oseltamivir Phosphate] Swelling, Rash   Depakote [valproic Acid] Hives, Itching   Lisinopril Swelling   PT can only take low dosage      Medication List    TAKE these medications   acetaminophen 325 MG tablet Commonly known as: TYLENOL Take 2 tablets (650 mg total) by mouth every 4 (four) hours as needed for headache or mild pain.   apixaban 5 MG Tabs tablet Commonly known as: Eliquis Take 1 tablet (5 mg total) by mouth 2 (two) times daily.   atorvastatin 40 MG tablet Commonly known as: LIPITOR Take 0.5 tablets (20 mg total) by mouth daily.   CALCARB 600/D PO Take 1 tablet by mouth daily.   cholecalciferol 1000 units tablet Commonly known  as: VITAMIN D Take 1,000 Units daily by mouth.   COD LIVER OIL PO Take 1 capsule by mouth every other day.   metoprolol tartrate 25 MG tablet Commonly known as: LOPRESSOR Take 1 tablet (25 mg total) by mouth 2 (two) times daily. What changed: how much to take   multivitamin with minerals Tabs tablet Take 1 tablet daily by mouth.   pantoprazole 40 MG tablet Commonly known as: PROTONIX Take 40 mg by  mouth daily as needed (heartburn).   Potassium 99 MG Tabs Take 99 mg by mouth 3 (three) times a week.   propafenone 150 MG tablet Commonly known as: RYTHMOL Take 1 tablet (150 mg total) by mouth every 8 (eight) hours.   valsartan 40 MG tablet Commonly known as: DIOVAN Take 1 tablet (40 mg total) by mouth daily.   vitamin A 10000 UNIT capsule Take 10,000 Units by mouth every other day.   zinc gluconate 50 MG tablet Take 50 mg by mouth every other day.   zolpidem 5 MG tablet Commonly known as: AMBIEN Take 5 mg by mouth at bedtime as needed for sleep.      Follow-up Information    Satira Sark, MD Follow up on 10/17/2019.   Specialty: Cardiology Why: Keep scheduled Cardiology follow-up for 10/17/2019 at 1:20 PM. Contact information: [address] [place] [address] [phone]          Allergies  Allergen Reactions  . Phenobarbital Hives and Swelling  . Tamiflu [Oseltamivir Phosphate] Swelling and Rash  . Depakote [Valproic Acid] Hives and Itching  . Lisinopril Swelling    PT can only take low dosage    Consultations:  Cardiology   Procedures/Studies: EP STUDY  Result Date: 09/19/2019 SURGEON:  Thompson Grayer, MD  PREPROCEDURE DIAGNOSES: 1. Paroxysmal atrial fibrillation.  POSTPROCEDURE DIAGNOSES: 1. Paroxysmal atrial fibrillation. 2. 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 Multiple atypical atrial flutter circuits  PROCEDURES: 1. Comprehensive electrophysiologic study. 2. Coronary sinus pacing and recording. 3. Three-dimensional mapping of atrial fibrillation with additional mapping and ablation of a second descrete arrhythmia (ectopic atrial tachycardia) as well as additional ablation within the left atrium for persistent afib 4. Ablation of atrial fibrillation with additional mapping and ablation of a second descrete arrhythmia (ectopic atrial tachycardia) as well as additional ablation within the left atrium for  persistent afib 5.  Intracardiac echocardiography. 6. Transseptal puncture of an intact septum. 7. Arrhythmia induction with pacing with isuprel infusion 8. External cardioversion. 9. Right femoral venous ultrasound guided access  INTRODUCTION:   Kristen Mckenzie is a 79 y.o. female  with a history of paroxsymal atrial fibrillation who now presents for EP study and radiofrequency ablation.  The patient reports initially being diagnosed with atrial fibrillation after presenting with symptomatic palpitations and fatgiue.  The patient has failed medical therapy with flecainide.  The patient therefore presents today for catheter ablation of atrial fibrillation.  DESCRIPTION OF PROCEDURE:  Informed written consent was obtained, and the patient was brought to the electrophysiology lab in a fasting state.   The patient was adequately sedated with intravenous medications as outlined in the anesthesia report.  [name] left and right groins were prepped and draped in the usual sterile fashion by the EP lab staff.  Using a percutaneous Seldinger technique, two 7-French and one 9-French hemostasis sheaths were placed into the right common femoral vein.   Direct Ultrasound guidance was used today for right common femoral venous access  with normal vessel patency.  Ultrasound images are captured and stored in the patients chart.  Catheter Placement:  A 7-French Biosense Webster Decapolar coronary sinus catheter was introduced through the right common femoral vein and advanced into the coronary sinus for recording and pacing from this location.  A 7 F Biosense Webster SmartTouch SF ablation catheter was introduced through the right common femoral vein and advanced into the right ventricle for recording and pacing.  This catheter was then pulled back to the His bundle location.  A luminal esophageal temperature probe was placed and used for continuous monitoring of the luminal esophageal temperature throughout the procedure as  well as to localize the esophagus on fluoroscopy. In addition, the esophagus was directly visualized with intracardiac echo and its positioned marked on Carto.  During ablation at the posterior wall there was limited esophageal heating noted during RF energy delivery with the maximal temperature recorded by the luminal temperature probe of < 38.5 degrees C. Initial Measurements: The patient presented to the electrophysiology lab in sinus rhythm.  The patients average RR interval measured 981 msec.  The PR interval was 127 msec with QRS 89 msec, QT 436 msec.  The AH was 72 msec and HV 38 msec.   Intracardiac Echocardiography: An 8-French Biosense Webster AcuNav intracardiac echocardiography catheter was introduced through the right common femoral vein and advanced into the right atrium. Intracardiac echocardiography was performed of the left atrium, and a three-dimensional anatomical rendering of the left atrium was performed using CARTO sound technology.  The patient was noted to have a small sized left atrium.  The interatrial septum was noted to not have a PFO. All 4 pulmonary veins were visualized and were noted to have separate ostia.  The pulmonary veins were moderate in size.  The left atrial appendage was visualized and did not reveal thrombus.   There was no evidence of pulmonary vein stenosis.  Transseptal Puncture: The middle right common femoral vein sheath was exchanged for an 8.5 [place] SL2 transseptal sheath and transseptal access was achieved in a standard fashion using a Baylis needle with intracardiac echocardiography confirmation of the transseptal puncture.  Once transseptal access had been achieved, heparin was administered intravenously and intra- arterially in order to maintain an ACT of greater than 350 seconds throughout the procedure.  3D Mapping and Ablation: The His bundle catheter was removed and in its place a 3.5 mm [employer] Thermocool SF ablation catheter was  advanced into the right atrium.  The transseptal sheath was pulled back into the IVC over a guidewire.  The ablation catheter was advanced across the transseptal hole using the wire as a guide.  The transseptal sheath was then re-advanced over the guidewire into the left atrium.  A duodecapolar Biosense Webster pentaray mapping catheter was introduced through the transseptal sheath and positioned over the mouth of all 4 pulmonary veins.  Three-dimensional electroanatomical mapping was performed using CARTO technology.  This demonstrated electrical activity within all four pulmonary veins at baseline. The patient underwent successful sequential electrical isolation and anatomical encircling of all four pulmonary veins using radiofrequency current with a pentaray mapping catheter as a guide. A WACA approach was used.  Sure [employer] was used.  The target Ablation Index was 300-350 on the posterior wall, 450-500 on the anterior wall of the left atrium.  RF was delivered at 50 watts for all lesions.  Additional mapping and ablation of a second descrite focus: Isuprel was infused at 20 mcg/min.  The patient developed spontaneous and incessant atrial tachycardia with CL 220 msec.  3D mapping of tachycardia was performed which revealed that the tachycardia arose from the os of the coronary sinus.  RF was delivered in this location with termination of tachycardia The patient then developed atypical appearing atrial flutter with CL of 300 msec.  The coronary sinus activation was midline.   Left atrial 3D mapping was performed however the tachycardia converted to a different atrial flutter with eccentric activation and cl of 280 msec.  This atrial flutter degenerated into atrial fibrillation.  Additional mapping and ablation due to persistence of atrial fibrillation. Due to persistence of atrial fibrillation, additional left atrial 3D mapping and ablation was performed.  A series of radiofrequency lesions were  delivered along the roof and floor of the left atrium in order to create a "standard box" lesion along the posterior wall of the left atrium.  Cardioversion: The patient was then cardioverted to sinus rhythm with a single synchronized 200-J biphasic shock with cardioversion electrodes in the anterior-posterior thoracic configuration.  Measurements Following Ablation: In sinus rhythm with RR interval was 792, with QRS 76 msec, and Qt 323msec.  Following ablation the AH interval measured 72 msec with an HV interval of 38 msec.  Ventricular pacing was performed, which revealed  Decremental VA conduction with VAWCL of < 300 msec.  Atrial pacing revealed AVWCL of 310 msec at the beginning of the procedure and 260 msec during isuprel.    With atrial pacing, PR>RR. Electroisolation was then again confirmed in all four pulmonary veins.  Entrance and exit block were demonstrated.  The procedure was therefore considered completed. All catheters were removed, and the sheaths were aspirated and flushed. Sheaths were removed and perclose stitch x 3 applied.  The patient was transferred to the recovery area for sheath removal per protocol.  EBL<10ml.   There were no early apparent complications.  CONCLUSIONS: 1. Sinus rhythm upon presentation.  2. Intracardiac echo reveals a normal sized left atrium. 3. Successful electrical isolation and anatomical encircling of all four pulmonary veins with radiofrequency current. 4. Additional mapping and ablation within the left atrium due to persistence of atrial fibrillation with a posterior wall box demonstrated 5. Atrial fibrillation successfully cardioverted to sinus rhythm. 6. Ectopic atrial tachycardia ablated at the ostium of the coronary sinus 7. 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 No early apparent complications. Thompson Grayer MD, [facility] 09/19/2019 2:06 PM   CT CARDIAC MORPH/PULM VEIN W/CM&W/O CA SCORE  Addendum  Date: 09/14/2019   ADDENDUM REPORT: 09/14/2019 13:26 CLINICAL DATA:  79 year old female with atrial fibrillation scheduled for an ablation. EXAM: Cardiac CT/CTA TECHNIQUE: The patient was scanned on a Siemens Somatom scanner. FINDINGS: A 120 kV prospective scan was triggered in the descending thoracic aorta at 111 HU's. Gantry rotation speed was 280 msecs and collimation was .9 mm. 125 mg of PO Metoprolol and no NTG was given. The 3D data set was reconstructed in 5% intervals of the 60-80 % of the R-R cycle. Diastolic phases were analyzed on a dedicated work station using MPR, MIP and VRT modes. The patient received 80 cc of contrast. There is normal pulmonary vein drainage into the left atrium (3 on the right and 2 on the left) with ostial measurements as follows: RUPV: 12.8 x 10.7 mm RMPV: 9.3 X 7.9 mm RLPV: 16.7 x 14.1 mm LUPV: 20.5 x 14.1 mm LLPV: 13.4 x 13.1 mm The left atrial  appendage is large with chicken wing morphology, 1 major lobe, ostial size 21 x 16 mm and length 34 mm. There is no thrombus in the left atrial appendage. The esophagus runs in the left atrial midline and is not in the proximity to any of the pulmonary veins. Aorta: Normal caliber. No dissection, moderate diffuse calcifications in the descending aorta. Aortic Valve:  Trileaflet.  Mild leaflet calcifications. Coronary Arteries: Normal coronary origin. Right dominance. The study was performed without use of NTG and insufficient for plaque evaluation. Calcium score is 0. IMPRESSION: 1. There is normal pulmonary vein drainage into the left atrium. 2. The left atrial appendage is large with chicken wing morphology, 1 major lobe, ostial size 21 x 16 mm and length 34 mm. There is no thrombus in the left atrial appendage. 3. The esophagus runs in the left atrial midline and is not in the proximity to any of the pulmonary veins. 4. Coronary Arteries: Normal coronary origin. Right dominance. The study was performed without use of NTG and  insufficient for plaque evaluation. Calcium score is 0. 5. Significant mitral annular calcifications. Electronically Signed   By: Ena Dawley   On: 09/14/2019 13:26   Result Date: 09/14/2019 EXAM: OVER-READ INTERPRETATION  CT CHEST The following report is an over-read performed by radiologist Dr. Abigail Miyamoto of [facility], [place] on 09/13/2019. This over-read does not include interpretation of cardiac or coronary anatomy or pathology. The coronary CTA interpretation by the cardiologist is attached. COMPARISON:  Chest radiograph 07/03/2019.  No prior CT. FINDINGS: Vascular: Aortic atherosclerosis. No central pulmonary embolism, on this non-dedicated study. Mediastinum/Nodes: No imaged thoracic adenopathy. Tiny hiatal hernia. Lungs/Pleura: Trace left pleural fluid or thickening. Mosaic attenuation within both lungs, possibly related to air trapping. Upper Abdomen: Normal imaged portions of the liver, spleen, pancreas. Musculoskeletal: Midthoracic spondylosis. IMPRESSION: 1.  No acute findings in the imaged extracardiac chest. 2.  Aortic Atherosclerosis (ICD10-I70.0). Electronically Signed: By: Abigail Miyamoto M.D. On: 09/13/2019 09:40   DG Chest Portable 1 View  Result Date: 09/27/2019 CLINICAL DATA:  LEFT chest pain for 30 minutes, feels like heart is racing, history collagen vascular disease, hypertension, atrial fibrillation EXAM: PORTABLE CHEST 1 VIEW COMPARISON:  Portable exam 1951 hours compared to 07/03/2019 FINDINGS: Normal heart size, mediastinal contours, and pulmonary vascularity. Known Atherosclerotic calcification aorta. Lungs clear. Minimal chronic peribronchial thickening noted. No infiltrate, pleural effusion or pneumothorax. Bones demineralized. IMPRESSION: Chronic bronchitic changes without acute infiltrate. Aortic Atherosclerosis (ICD10-I70.0). Electronically Signed   By: Lavonia Dana M.D.   On: 09/27/2019 20:22       Subjective: Patient seen and examined at the bedside this  morning.  Hemodynamically stable for discharge to home.  Discharge Exam: Vitals:   09/29/19 1100 09/29/19 1130  BP: 121/64 108/75  Pulse: 67 75  Resp: 19 20  Temp: 98.2 F (36.8 C) 98.2 F (36.8 C)  SpO2: 95% 94%   Vitals:   09/29/19 0940 09/29/19 0959 09/29/19 1100 09/29/19 1130  BP: (!) 95/55 103/65 121/64 108/75  Pulse: 71 73 67 75  Resp: 14 17 19 20   Temp: 98.2 F (36.8 C)  98.2 F (36.8 C) 98.2 F (36.8 C)  TempSrc:   Oral Oral  SpO2: 97% 98% 95% 94%  Weight:      Height:        General: Pt is alert, awake, not in acute distress Cardiovascular: RRR, S1/S2 +, no rubs, no gallops Respiratory: CTA bilaterally, no wheezing, no rhonchi Abdominal: Soft, NT, ND, bowel  sounds + Extremities: no edema, no cyanosis    The results of significant diagnostics from this hospitalization (including imaging, microbiology, ancillary and laboratory) are listed below for reference.     Microbiology: Recent Results (from the past 240 hour(s))  SARS Coronavirus 2 by RT PCR (hospital order, performed in [facility] hospital lab) Nasopharyngeal Nasopharyngeal Swab     Status: None   Collection Time: 09/27/19 11:10 PM   Specimen: Nasopharyngeal Swab  Result Value Ref Range Status   SARS Coronavirus 2 NEGATIVE NEGATIVE Final    Comment: (NOTE) SARS-CoV-2 target nucleic acids are NOT DETECTED. The SARS-CoV-2 RNA is generally detectable in upper and lower respiratory specimens during the acute phase of infection. The lowest concentration of SARS-CoV-2 viral copies this assay can detect is 250 copies / mL. A negative result does not preclude SARS-CoV-2 infection and should not be used as the sole basis for treatment or other patient management decisions.  A negative result may occur with improper specimen collection / handling, submission of specimen other than nasopharyngeal swab, presence of viral mutation(s) within the areas targeted by this assay, and inadequate number of viral  copies (<250 copies / mL). A negative result must be combined with clinical observations, patient history, and epidemiological information. Fact Sheet for Patients:   StrictlyIdeas.no Fact Sheet for Healthcare Providers: BankingDealers.co.za This test is not yet approved or cleared  by the [place] FDA and has been authorized for detection and/or diagnosis of SARS-CoV-2 by FDA under an Emergency Use Authorization (EUA).  This EUA will remain in effect (meaning this test can be used) for the duration of the COVID-19 declaration under Section 564(b)(1) of the Act, 21 U.S.C. section 360bbb-3(b)(1), unless the authorization is terminated or revoked sooner. Performed at [facility], [address] [place], [place] [address]   MRSA PCR Screening     Status: None   Collection Time: 09/28/19  6:31 AM   Specimen: Nasopharyngeal  Result Value Ref Range Status   MRSA by PCR NEGATIVE NEGATIVE Final    Comment:        The GeneXpert MRSA Assay (FDA approved for NASAL specimens only), is one component of a comprehensive MRSA colonization surveillance program. It is not intended to diagnose MRSA infection nor to guide or monitor treatment for MRSA infections. Performed at [facility], [place] [address], [place], [place] [address]      Labs: BNP (last 3 results) No results for input(s): BNP in the last 8760 hours. Basic Metabolic Panel: Recent Labs  Lab 09/27/19 1924  NA 134*  K 3.8  CL 100  CO2 22  GLUCOSE 122*  BUN 19  CREATININE 0.65  CALCIUM 9.1   Liver Function Tests: Recent Labs  Lab 09/27/19 1924  AST 27  ALT 28  ALKPHOS 85  BILITOT 0.5  PROT 6.9  ALBUMIN 4.3   No results for input(s): LIPASE, AMYLASE in the last 168 hours. No results for input(s): AMMONIA in the last 168 hours. CBC: Recent Labs  Lab 09/27/19 1924 09/29/19 0534  WBC 7.3 11.3*  HGB 12.1 11.8*  HCT 36.2 35.3*  MCV 96.5 95.1  PLT 288  256   Cardiac Enzymes: No results for input(s): CKTOTAL, CKMB, CKMBINDEX, TROPONINI in the last 168 hours. BNP: Invalid input(s): POCBNP CBG: No results for input(s): GLUCAP in the last 168 hours. D-Dimer No results for input(s): DDIMER in the last 72 hours. Hgb A1c No results for input(s): HGBA1C in the last 72 hours. Lipid Profile  No results for input(s): CHOL, HDL, LDLCALC, TRIG, CHOLHDL, LDLDIRECT in the last 72 hours. Thyroid function studies No results for input(s): TSH, T4TOTAL, T3FREE, THYROIDAB in the last 72 hours.  Invalid input(s): FREET3 Anemia work up No results for input(s): VITAMINB12, FOLATE, FERRITIN, TIBC, IRON, RETICCTPCT in the last 72 hours. Urinalysis No results found for: COLORURINE, APPEARANCEUR, [place], [place], [place], [place], [place], [place], PROTEINUR, UROBILINOGEN, NITRITE, LEUKOCYTESUR Sepsis Labs Invalid input(s): PROCALCITONIN,  WBC,  LACTICIDVEN Microbiology Recent Results (from the past 240 hour(s))  SARS Coronavirus 2 by RT PCR (hospital order, performed in [facility] hospital lab) Nasopharyngeal Nasopharyngeal Swab     Status: None   Collection Time: 09/27/19 11:10 PM   Specimen: Nasopharyngeal Swab  Result Value Ref Range Status   SARS Coronavirus 2 NEGATIVE NEGATIVE Final    Comment: (NOTE) SARS-CoV-2 target nucleic acids are NOT DETECTED. The SARS-CoV-2 RNA is generally detectable in upper and lower respiratory specimens during the acute phase of infection. The lowest concentration of SARS-CoV-2 viral copies this assay can detect is 250 copies / mL. A negative result does not preclude SARS-CoV-2 infection and should not be used as the sole basis for treatment or other patient management decisions.  A negative result may occur with improper specimen collection / handling, submission of specimen other than nasopharyngeal swab, presence of viral mutation(s) within the areas targeted by this assay, and inadequate number of viral  copies (<250 copies / mL). A negative result must be combined with clinical observations, patient history, and epidemiological information. Fact Sheet for Patients:   StrictlyIdeas.no Fact Sheet for Healthcare Providers: BankingDealers.co.za This test is not yet approved or cleared  by the [place] FDA and has been authorized for detection and/or diagnosis of SARS-CoV-2 by FDA under an Emergency Use Authorization (EUA).  This EUA will remain in effect (meaning this test can be used) for the duration of the COVID-19 declaration under Section 564(b)(1) of the Act, 21 U.S.C. section 360bbb-3(b)(1), unless the authorization is terminated or revoked sooner. Performed at [facility], [address], [place], [place] [address]   MRSA PCR Screening     Status: None   Collection Time: 09/28/19  6:31 AM   Specimen: Nasopharyngeal  Result Value Ref Range Status   MRSA by PCR NEGATIVE NEGATIVE Final    Comment:        The GeneXpert MRSA Assay (FDA approved for NASAL specimens only), is one component of a comprehensive MRSA colonization surveillance program. It is not intended to diagnose MRSA infection nor to guide or monitor treatment for MRSA infections. Performed at [facility], [place] [address], [place], [place] [address]     Please note: You were cared for by a hospitalist during your hospital stay. Once you are discharged, your primary care physician will handle any further medical issues. Please note that NO REFILLS for any discharge medications will be authorized once you are discharged, as it is imperative that you return to your primary care physician (or establish a relationship with a primary care physician if you do not have one) for your post hospital discharge needs so that they can reassess your need for medications and monitor your lab values.    Time coordinating discharge: 40 minutes  SIGNED:   Shelly Coss,  MD  [facility] 09/29/2019, 2:46 PM Pager [phone]  If 7PM-7AM, please contact night-coverage [email or web address] Password [email or web address]

## 2019-09-29 NOTE — Progress Notes (Signed)
Consent signed by the pt in presence of Dr Lovena Le and RN. Updated OR, pt is leaving for DCCV shortly, will give her morning meds after the procedure.  Palma Holter, RN

## 2019-09-29 NOTE — CV Procedure (Signed)
EP Procedure Note  Procedure performed: DC cardioversion  Preoperative diagnosis: Atrial fibrillation, uncontrolled  Postoperative diagnosis: Same as preoperative diagnosis  Description of the procedure: After informed consent was obtained, the patient was prepped in the usual manner.  The electrodispersive pad was placed in the anterior posterior position.  She was sedated with intravenous propofol.  When she had achieved an adequate level of sedation, she was cardioverted with 200 J of synchronized biphasic energy applied through the electrodispersive pad.  Sinus rhythm was restored.  The patient tolerated the procedure normally.  She was recovered in the usual manner.  Complications: There were no immediate procedural complications  Conclusion: Successful DC cardioversion of a patient with symptomatic atrial fibrillation.  Cristopher Peru, MD

## 2019-09-29 NOTE — Progress Notes (Signed)
[place] for heparin > back to apixaban Indication: atrial fibrillation  Allergies  Allergen Reactions   Phenobarbital Hives and Swelling   Tamiflu [Oseltamivir Phosphate] Swelling and Rash   Depakote [Valproic Acid] Hives and Itching   Lisinopril Swelling    PT can only take low dosage    Patient Measurements: Height: 5\' 3"  (160 cm) Weight: 57.6 kg (127 lb) IBW/kg (Calculated) : 52.4   Vital Signs: Temp: 98.2 F (36.8 C) (06/06 1130) Temp Source: Oral (06/06 1130) BP: 108/75 (06/06 1130) Pulse Rate: 75 (06/06 1130)  Labs: Recent Labs    09/27/19 1924 09/27/19 2101 09/28/19 0034 09/29/19 0534  HGB 12.1  --   --  11.8*  HCT 36.2  --   --  35.3*  PLT 288  --   --  256  APTT  --   --   --  32  HEPARINUNFRC  --   --   --  >2.20*  CREATININE 0.65  --   --   --   TROPONINIHS 22* 47* 170*  --     Estimated Creatinine Clearance: 47.2 mL/min (by C-G formula based on SCr of 0.65 mg/dL).  Assessment: CC/HPI: 79 yo f presenting with palpitations and chest tightness  PMH: HTN Arthritis afib on apixaban  Pt is s/p DCCV. She is back on apixaban. Age<80, wt<60, scr<1.5  Goal of Therapy:  Monitor platelets by anticoagulation protocol: Yes   Plan:  Continue apixaban 5mg  BID Follow peripherally  Onnie Boer, PharmD, BCIDP, AAHIVP, CPP Infectious Disease Pharmacist 09/29/2019 2:05 PM

## 2019-09-29 NOTE — Progress Notes (Signed)
Pt got discharged to home, discharge instructions provided and patient showed understanding to it, IV taken out,Telemonitor DC,pt left unit in wheelchair with all of the belongings accompanied with a family member (Daughter)  Sreshta Cressler, RN 

## 2019-09-29 NOTE — Transfer of Care (Signed)
Immediate Anesthesia Transfer of Care Note  Patient: Kristen Mckenzie  Procedure(s) Performed: CARDIOVERSION (N/A )  Patient Location: PACU  Anesthesia Type:General  Level of Consciousness: drowsy and patient cooperative  Airway & Oxygen Therapy: Patient Spontanous Breathing and Patient connected to nasal cannula oxygen  Post-op Assessment: Report given to RN and Post -op Vital signs reviewed and stable  Post vital signs: Reviewed and stable  Last Vitals:  Vitals Value Taken Time  BP 101/57 09/29/19 0916  Temp 36.6 C 09/29/19 0910  Pulse 73 09/29/19 0917  Resp 22 09/29/19 0917  SpO2 97 % 09/29/19 0917  Vitals shown include unvalidated device data.  Last Pain:  Vitals:   09/29/19 0910  TempSrc:   PainSc: (P) Asleep         Complications: No apparent anesthesia complications

## 2019-09-29 NOTE — Discharge Instructions (Signed)

## 2019-09-29 NOTE — Anesthesia Postprocedure Evaluation (Signed)
Anesthesia Post Note  Patient: Kristen Mckenzie  Procedure(s) Performed: CARDIOVERSION (N/A )     Patient location during evaluation: PACU Anesthesia Type: General Level of consciousness: sedated and patient cooperative Pain management: pain level controlled Vital Signs Assessment: post-procedure vital signs reviewed and stable Respiratory status: spontaneous breathing Cardiovascular status: stable Anesthetic complications: no    Last Vitals:  Vitals:   09/29/19 0940 09/29/19 0959  BP: (!) 95/55 103/65  Pulse: 71 73  Resp: 14 17  Temp: 36.8 C   SpO2: 97% 98%    Last Pain:  Vitals:   09/29/19 0959  TempSrc:   PainSc: 0-No pain                 Nolon Nations

## 2019-09-29 NOTE — Progress Notes (Signed)
Pt is planned to have DCCV this morning. NPO since midnight, BP running soft, HR upto 150's. MD and rapid response RN notified, MEWS interventions on board, pt is alert and oriented, will continue to monitor  Palma Holter, RN

## 2019-09-29 NOTE — Progress Notes (Signed)
PROGRESS NOTE    Kristen Mckenzie  OHY:[id number] DOB: 29-Jun-1940 DOA: 09/27/2019 PCP: Caryl Bis, MD   Brief Narrative: Patient is a 79 year old female with history of hypertension, anxiety, arthritis, atrial fibrillation status post ablation who presented to the emergency room with complaint of chest pain/palpitations.  She had ablation done last week for atrial fibrillation.  She reported developing severe palpitation with left-sided chest pain.  On presentation, she was tachycardic with heart rate in the range of 140s otherwise hemodynamically stable.  Troponin was mildly elevated.  Chest x-ray showed chronic changes.  EKG showed A. fib.  She was started on Cardizem drip.She underwent  DCCV cardioversion today.  Assessment & Plan:   Principal Problem:   Atrial fibrillation with RVR (HCC) Active Problems:   Essential hypertension   Elevated troponin   Chest pain  A. fib with RVR: Presented with A. fib with RVR.  Status post ablation done a week ago for A. fib.  She was started  on Cardizem drip.  Started on heparin for  anticoagulation.  She takes Eliquis at home, which we have  resumed. Her heart rate was well controlled yesterday but yesterday afternoon she went into A. fib with RVR and had to be started on Cardizem drip which made her hypotensive, bradycardic and she had to be given IV fluids. She underwent  DCCV cardioversion today.  Currently her heart rate is well controlled.  She has been started on metoprolol and propafenone.  EP following.  Elevated troponin: Most likely due to demand ischemia from rapid ventricular rate.  She also had left-sided mild chest tightness which has resolved.  Troponin is mildly elevated.  Anxiety: Continue her home medicines  Hypertension: Now the Blood pressure  is stable.  Continue current medications  Hyperlipidemia: On Lipitor at home.  Insomnia: On zolpidem         DVT prophylaxis:Eliquis Code Status: Full Family Communication:  Called daughter on phone today  Status is: Inpatient  Remains inpatient appropriate because:IV treatments appropriate due to intensity of illness or inability to take PO   Dispo: The patient is from: Home              Anticipated d/c is to: Home              Anticipated d/c date is: 1 day              Patient currently is not medically stable to d/c.    Consultants: Cardiology  Procedures:None  Antimicrobials:  Anti-infectives (From admission, onward)   None      Subjective: Patient seen and examined at the bedside this morning.  During my evaluation today, she was hypotensive.  She became hypotensive and bradycardic last night.  She had to be put on dopamine drip and was given IV fluids.  She underwent successful DCCV cardioversion today.  Objective: Vitals:   09/29/19 0910 09/29/19 0925 09/29/19 0940 09/29/19 0959  BP: 97/60 (!) 92/58 (!) 95/55 103/65  Pulse: (!) 150 72 71 73  Resp: (!) 25 (!) 24 14 17   Temp: 97.9 F (36.6 C)  98.2 F (36.8 C)   TempSrc:      SpO2: 94% 99% 97% 98%  Weight:      Height:        Intake/Output Summary (Last 24 hours) at 09/29/2019 1044 Last data filed at 09/29/2019 0915 Gross per 24 hour  Intake 693.51 ml  Output 4150 ml  Net -3456.49 ml   [employer]  09/27/19 1911  Weight: 57.6 kg    Examination:  General exam: Weak, lying in bed, pleasant elderly female HEENT:PERRL,Oral mucosa moist, Ear/Nose normal on gross exam Respiratory system: Bilateral equal air entry, normal vesicular breath sounds, no wheezes or crackles  Cardiovascular system: A. fib with RVR  gastrointestinal system: Abdomen is nondistended, soft and nontender. No organomegaly or masses felt. Normal bowel sounds heard. Central nervous system: Alert and oriented Extremities: No edema, no clubbing ,no cyanosis Skin: No rashes, lesions or ulcers,no icterus ,no pallor   Data Reviewed: I have personally reviewed following labs and imaging studies  CBC: Recent  Labs  Lab 09/27/19 1924 09/29/19 0534  WBC 7.3 11.3*  HGB 12.1 11.8*  HCT 36.2 35.3*  MCV 96.5 95.1  PLT 288 272   Basic Metabolic Panel: Recent Labs  Lab 09/27/19 1924  NA 134*  K 3.8  CL 100  CO2 22  GLUCOSE 122*  BUN 19  CREATININE 0.65  CALCIUM 9.1   GFR: Estimated Creatinine Clearance: 47.2 mL/min (by C-G formula based on SCr of 0.65 mg/dL). Liver Function Tests: Recent Labs  Lab 09/27/19 1924  AST 27  ALT 28  ALKPHOS 85  BILITOT 0.5  PROT 6.9  ALBUMIN 4.3   No results for input(s): LIPASE, AMYLASE in the last 168 hours. No results for input(s): AMMONIA in the last 168 hours. Coagulation Profile: No results for input(s): INR, PROTIME in the last 168 hours. Cardiac Enzymes: No results for input(s): CKTOTAL, CKMB, CKMBINDEX, TROPONINI in the last 168 hours. BNP (last 3 results) No results for input(s): PROBNP in the last 8760 hours. HbA1C: No results for input(s): HGBA1C in the last 72 hours. CBG: No results for input(s): GLUCAP in the last 168 hours. Lipid Profile: No results for input(s): CHOL, HDL, LDLCALC, TRIG, CHOLHDL, LDLDIRECT in the last 72 hours. Thyroid Function Tests: No results for input(s): TSH, T4TOTAL, FREET4, T3FREE, THYROIDAB in the last 72 hours. Anemia Panel: No results for input(s): VITAMINB12, FOLATE, FERRITIN, TIBC, IRON, RETICCTPCT in the last 72 hours. Sepsis Labs: No results for input(s): PROCALCITON, LATICACIDVEN in the last 168 hours.  Recent Results (from the past 240 hour(s))  SARS Coronavirus 2 by RT PCR (hospital order, performed in [facility] hospital lab) Nasopharyngeal Nasopharyngeal Swab     Status: None   Collection Time: 09/27/19 11:10 PM   Specimen: Nasopharyngeal Swab  Result Value Ref Range Status   SARS Coronavirus 2 NEGATIVE NEGATIVE Final    Comment: (NOTE) SARS-CoV-2 target nucleic acids are NOT DETECTED. The SARS-CoV-2 RNA is generally detectable in upper and lower respiratory specimens during the  acute phase of infection. The lowest concentration of SARS-CoV-2 viral copies this assay can detect is 250 copies / mL. A negative result does not preclude SARS-CoV-2 infection and should not be used as the sole basis for treatment or other patient management decisions.  A negative result may occur with improper specimen collection / handling, submission of specimen other than nasopharyngeal swab, presence of viral mutation(s) within the areas targeted by this assay, and inadequate number of viral copies (<250 copies / mL). A negative result must be combined with clinical observations, patient history, and epidemiological information. Fact Sheet for Patients:   StrictlyIdeas.no Fact Sheet for Healthcare Providers: BankingDealers.co.za This test is not yet approved or cleared  by the [place] FDA and has been authorized for detection and/or diagnosis of SARS-CoV-2 by FDA under an Emergency Use Authorization (EUA).  This EUA will remain in effect (meaning this  test can be used) for the duration of the COVID-19 declaration under Section 564(b)(1) of the Act, 21 U.S.C. section 360bbb-3(b)(1), unless the authorization is terminated or revoked sooner. Performed at [facility], [address] [place], [place] [address]   MRSA PCR Screening     Status: None   Collection Time: 09/28/19  6:31 AM   Specimen: Nasopharyngeal  Result Value Ref Range Status   MRSA by PCR NEGATIVE NEGATIVE Final    Comment:        The GeneXpert MRSA Assay (FDA approved for NASAL specimens only), is one component of a comprehensive MRSA colonization surveillance program. It is not intended to diagnose MRSA infection nor to guide or monitor treatment for MRSA infections. Performed at [facility], [place] [address] [place], [place] [address]          Radiology Studies: DG Chest Portable 1 View  Result Date: 09/27/2019 CLINICAL DATA:  LEFT chest  pain for 30 minutes, feels like heart is racing, history collagen vascular disease, hypertension, atrial fibrillation EXAM: PORTABLE CHEST 1 VIEW COMPARISON:  Portable exam 1951 hours compared to 07/03/2019 FINDINGS: Normal heart size, mediastinal contours, and pulmonary vascularity. Known Atherosclerotic calcification aorta. Lungs clear. Minimal chronic peribronchial thickening noted. No infiltrate, pleural effusion or pneumothorax. Bones demineralized. IMPRESSION: Chronic bronchitic changes without acute infiltrate. Aortic Atherosclerosis (ICD10-I70.0). Electronically Signed   By: Lavonia Dana M.D.   On: 09/27/2019 20:22        Scheduled Meds: . apixaban  5 mg Oral BID  . atorvastatin  20 mg Oral Daily  . metoprolol tartrate  25 mg Oral BID  . propafenone  150 mg Oral Q8H  . sodium chloride flush  3 mL Intravenous Once   Continuous Infusions:    LOS: 1 day    Time spent:25 mins. More than 50% of that time was spent in counseling and/or coordination of care.      Shelly Coss, MD [facility] P6/09/2019, 10:44 AM

## 2019-10-07 DIAGNOSIS — K2100 Gastro-esophageal reflux disease with esophagitis, without bleeding: Secondary | ICD-10-CM | POA: Diagnosis not present

## 2019-10-07 DIAGNOSIS — E782 Mixed hyperlipidemia: Secondary | ICD-10-CM | POA: Diagnosis not present

## 2019-10-07 DIAGNOSIS — E079 Disorder of thyroid, unspecified: Secondary | ICD-10-CM | POA: Diagnosis not present

## 2019-10-07 DIAGNOSIS — I471 Supraventricular tachycardia: Secondary | ICD-10-CM | POA: Diagnosis not present

## 2019-10-07 DIAGNOSIS — I480 Paroxysmal atrial fibrillation: Secondary | ICD-10-CM | POA: Diagnosis not present

## 2019-10-07 DIAGNOSIS — F331 Major depressive disorder, recurrent, moderate: Secondary | ICD-10-CM | POA: Diagnosis not present

## 2019-10-07 DIAGNOSIS — F418 Other specified anxiety disorders: Secondary | ICD-10-CM | POA: Diagnosis not present

## 2019-10-07 DIAGNOSIS — Z6822 Body mass index (BMI) 22.0-22.9, adult: Secondary | ICD-10-CM | POA: Diagnosis not present

## 2019-10-07 DIAGNOSIS — K219 Gastro-esophageal reflux disease without esophagitis: Secondary | ICD-10-CM | POA: Diagnosis not present

## 2019-10-07 DIAGNOSIS — I10 Essential (primary) hypertension: Secondary | ICD-10-CM | POA: Diagnosis not present

## 2019-10-17 ENCOUNTER — Encounter: Payer: Self-pay | Admitting: Cardiology

## 2019-10-17 ENCOUNTER — Other Ambulatory Visit: Payer: Self-pay

## 2019-10-17 ENCOUNTER — Ambulatory Visit (INDEPENDENT_AMBULATORY_CARE_PROVIDER_SITE_OTHER): Payer: Medicare Other | Admitting: Cardiology

## 2019-10-17 VITALS — BP 110/60 | HR 60 | Ht 63.0 in | Wt 127.0 lb

## 2019-10-17 DIAGNOSIS — I480 Paroxysmal atrial fibrillation: Secondary | ICD-10-CM | POA: Diagnosis not present

## 2019-10-17 DIAGNOSIS — I10 Essential (primary) hypertension: Secondary | ICD-10-CM

## 2019-10-17 MED ORDER — METOPROLOL TARTRATE 25 MG PO TABS: 12.5000 mg | ORAL_TABLET | Freq: Two times a day (BID) | ORAL | 6 refills | Status: DC

## 2019-10-17 NOTE — Patient Instructions (Addendum)
Medication Instructions:   Your physician has recommended you make the following change in your medication:   Decrease metoprolol tartrate to 12.5 mg twice daily. Please break your 25 mg tablet in half twice daily  Continue other medications the same  Labwork:  NONE  Testing/Procedures:  NONE  Follow-Up:  Your physician recommends that you schedule a follow-up appointment in: 4 months (office).  Any Other Special Instructions Will Be Listed Below (If Applicable).  If you need a refill on your cardiac medications before your next appointment, please call your pharmacy.

## 2019-10-17 NOTE — Progress Notes (Signed)
Cardiology Office Note  Date: 10/17/2019   ID: Kristen, Mckenzie 02-09-1941, MRN [id number]  PCP:  Caryl Bis, MD  Cardiologist:  Rozann Lesches, MD Electrophysiologist:  Thompson Grayer, MD   Chief Complaint  Patient presents with  . Cardiac follow-up    History of Present Illness: Kristen Mckenzie is a 79 y.o. female last seen in March.  She presents for a follow-up visit.  I reviewed interval records. She underwent atrial fibrillation ablation with Dr. Rayann Heman back in May.  She has had recurrent atrial fibrillation and continues on low-dose propafenone along with Lopressor, underwent electrical cardioversion recently in early June.  She presents today reporting no progressive palpitations.  She does feel short of breath with activity.  Heart rate is 60 today, I personally reviewed her ECG which shows sinus rhythm with low voltage, normal QTc.  She has normal LVEF by echocardiography and also no evidence of significant CAD by cardiac catheterization in October of last year.  We went over her medications today and I recommended that she consider cutting her Lopressor down to 12.5 mg twice daily while she is on the Rythmol and see if this makes any difference and her shortness of breath.  She has follow-up in the atrial fibrillation clinic tomorrow.  Past Medical History:  Diagnosis Date  . Anxiety   . Arthritis   . Cholecystolithiasis   . Collagen vascular disease ([place])   . Cystocele with prolapse   . Hypertension   . PAF (paroxysmal atrial fibrillation) ([place])   . Rectocele     Past Surgical History:  Procedure Laterality Date  . ANTERIOR AND POSTERIOR REPAIR N/A 02/21/2018   Procedure: ANTERIOR (CYSTOCELE) REPAIR;  Surgeon: Bjorn Loser, MD;  Location: [facility];  Service: Urology;  Laterality: N/A;  . ATRIAL FIBRILLATION ABLATION N/A 09/19/2019   Procedure: ATRIAL FIBRILLATION ABLATION;  Surgeon: Thompson Grayer, MD;  Location: [facility];   Service: Cardiovascular;  Laterality: N/A;  . CARDIOVERSION N/A 09/29/2019   Procedure: CARDIOVERSION;  Surgeon: Evans Lance, MD;  Location: [facility];  Service: Cardiovascular;  Laterality: N/A;  . CYSTOSCOPY N/A 02/21/2018   Procedure: CYSTOSCOPY;  Surgeon: Bjorn Loser, MD;  Location[facility];  Service: Urology;  Laterality: N/A;  . EYE SURGERY Bilateral 2011   cataract extraction   . LAPAROSCOPIC VAGINAL HYSTERECTOMY WITH SALPINGO OOPHORECTOMY Bilateral 02/21/2018   Procedure: LAPAROSCOPIC ASSISTED VAGINAL HYSTERECTOMY WITH SALPINGO OOPHORECTOMY;  Surgeon: Maisie Fus, MD;  Location: [facility];  Service: Gynecology;  Laterality: Bilateral;  Dr. Matilde Sprang to follow  . LEFT HEART CATH AND CORONARY ANGIOGRAPHY N/A 02/07/2019   Procedure: LEFT HEART CATH AND CORONARY ANGIOGRAPHY;  Surgeon: Burnell Blanks, MD;  Location: [facility];  Service: Cardiovascular;  Laterality: N/A;    Current Outpatient Medications  Medication Sig Dispense Refill  . acetaminophen (TYLENOL) 325 MG tablet Take 2 tablets (650 mg total) by mouth every 4 (four) hours as needed for headache or mild pain.    Marland Kitchen apixaban (ELIQUIS) 5 MG TABS tablet Take 1 tablet (5 mg total) by mouth 2 (two) times daily. 28 tablet 0  . atorvastatin (LIPITOR) 40 MG tablet Take 0.5 tablets (20 mg total) by mouth daily.    . Calcium Carbonate-Vitamin D (CALCARB 600/D PO) Take 1 tablet by mouth daily.     . cholecalciferol (VITAMIN D) 1000 units tablet Take 1,000 Units daily by mouth.    . COD LIVER  OIL PO Take 1 capsule by mouth every other day.     . metoprolol tartrate (LOPRESSOR) 25 MG tablet Take 0.5 tablets (12.5 mg total) by mouth 2 (two) times daily. 30 tablet 6  . Multiple Vitamin (MULTIVITAMIN WITH MINERALS) TABS tablet Take 1 tablet daily by mouth.    . pantoprazole (PROTONIX) 40 MG tablet Take 40 mg by mouth daily as needed (heartburn).    . Potassium 99 MG TABS Take 99 mg by  mouth 3 (three) times a week.     . propafenone (RYTHMOL) 150 MG tablet Take 1 tablet (150 mg total) by mouth every 8 (eight) hours. 90 tablet 1  . valsartan (DIOVAN) 40 MG tablet Take 1 tablet (40 mg total) by mouth daily. 90 tablet 3  . vitamin A 10000 UNIT capsule Take 10,000 Units by mouth every other day.     . zinc gluconate 50 MG tablet Take 50 mg by mouth every other day.    . zolpidem (AMBIEN) 5 MG tablet Take 5 mg by mouth at bedtime as needed for sleep.      No current facility-administered medications for this visit.   Allergies:  Phenobarbital, Tamiflu [oseltamivir phosphate], Depakote [valproic acid], and Lisinopril   ROS:   No syncope.  Physical Exam: VS:  BP 110/60   Pulse 60   Ht 5\' 3"  (1.6 m)   Wt 127 lb (57.6 kg)   SpO2 96%   BMI 22.50 kg/m , BMI Body mass index is 22.5 kg/m.  Wt Readings from Last 3 Encounters:  10/17/19 127 lb (57.6 kg)  09/27/19 127 lb (57.6 kg)  09/20/19 130 lb 1.1 oz (59 kg)    General: Elderly woman, appears comfortable at rest. HEENT: Conjunctiva and lids normal, wearing a mask. Neck: Supple, no elevated JVP or carotid bruits, no thyromegaly. Lungs: Clear to auscultation, nonlabored breathing at rest. Cardiac: Regular rate and rhythm, no S3, 2/6 systolic murmur, no pericardial rub. Extremities: No pitting edema, distal pulses 2+.  ECG:  An ECG dated 09/27/2019 was personally reviewed today and demonstrated:  Atrial fibrillation with RVR.  Recent Labwork: 02/06/2019: TSH 2.815 07/03/2019: Magnesium 2.0 09/27/2019: ALT 28; AST 27; BUN 19; Creatinine, Ser 0.65; Potassium 3.8; Sodium 134 09/29/2019: Hemoglobin 11.8; Platelets 256     Component Value Date/Time   CHOL 222 (H) 03/01/2017 0458   TRIG 109 03/01/2017 0458   HDL 47 03/01/2017 0458   CHOLHDL 4.7 03/01/2017 0458   VLDL 22 03/01/2017 0458   LDLCALC 153 (H) 03/01/2017 0458    Other Studies Reviewed Today:  Cardiac catheterization 02/07/2019:  The left ventricular systolic  function is normal.  LV end diastolic pressure is normal.  The left ventricular ejection fraction is greater than 65% by visual estimate.  There is no mitral valve regurgitation.   1. No angiographic evidence of CAD 2. Normal LV systolic function' 3. Elevation in troponin likely due to demand ischemia in setting of rapid atrial fibrillation  Echocardiogram 01/16/2019: 1. Left ventricular ejection fraction, by visual estimation, is 70 to  75%. The left ventricle has hyperdynamic function. Normal left ventricular  posterior wall thickness. There is no left ventricular hypertrophy.  2. Elevated mean left atrial pressure.  3. Left ventricular diastolic Doppler parameters are consistent with  impaired relaxation pattern of LV diastolic filling.  4. There is focal basal septal LV hypertrophy. In the setting of focal  basal septal hypertrophy and hyperdynamic LV function there is SAM of the  anterior mitral valve leaflet  and a dynamic LVOT gradient with a peak of  32 mmHg.  5. Global right ventricle has normal systolic function.The right  ventricular size is normal. No increase in right ventricular wall  thickness.  6. Left atrial size was mildly dilated.  7. Right atrial size was normal.  8. Moderate mitral annular calcification.  9. The mitral valve is abnormal. Mild mitral valve regurgitation. No  evidence of mitral stenosis.  10. The tricuspid valve is normal in structure. Tricuspid valve  regurgitation is mild.  11. The aortic valve is tricuspid Aortic valve regurgitation was not  visualized by color flow Doppler. Structurally normal aortic valve, with  no evidence of sclerosis or stenosis.  12. The pulmonic valve was not well visualized. Pulmonic valve  regurgitation is not visualized by color flow Doppler.  13. Mildly elevated pulmonary artery systolic pressure.  14. PASP is borderline elevated at 30 mmHg.   Assessment and Plan:  1.  Paroxysmal atrial fibrillation  with CHA2DS2-VASc score of 4.  She is status post atrial fibrillation ablation with Dr. Rayann Heman back in May, more recently cardioversion for symptomatic rapid atrial fibrillation in early June.  She is in sinus rhythm today, heart rate 60, does have some exercise intolerance with shortness of breath.  She has normal LVEF and no significant CAD by most recent work-up.  Plan will be to continue Rythmol and Eliquis, reduce Lopressor to 12.5 mg twice daily.  Keep follow-up in the atrial fibrillation clinic.  2.  Essential hypertension by history, blood pressure is normal today.  She remains on Diovan as well.  Medication Adjustments/Labs and Tests Ordered: Current medicines are reviewed at length with the patient today.  Concerns regarding medicines are outlined above.   Tests Ordered: No orders of the defined types were placed in this encounter.   Medication Changes: Meds ordered this encounter  Medications  . metoprolol tartrate (LOPRESSOR) 25 MG tablet    Sig: Take 0.5 tablets (12.5 mg total) by mouth 2 (two) times daily.    Dispense:  30 tablet    Refill:  6    10/17/2019 decrease dose    Disposition:  Follow up 4 months in the [place] office.  Signed, Satira Sark, MD, [facility] 10/17/2019 1:54 PM    [facility] [place], [place], [place] [address] Phone: [phone]; Fax: [phone]

## 2019-10-17 NOTE — Addendum Note (Signed)
Addended by: Merlene Laughter on: 10/17/2019 03:20 PM   Modules accepted: Orders

## 2019-10-18 ENCOUNTER — Ambulatory Visit (HOSPITAL_COMMUNITY)
Admission: RE | Admit: 2019-10-18 | Discharge: 2019-10-18 | Disposition: A | Discharge status: Home / Self Care | Payer: Medicare Other | Source: Ambulatory Visit | Attending: Physician Assistant | Admitting: Physician Assistant

## 2019-10-18 ENCOUNTER — Encounter (HOSPITAL_COMMUNITY): Payer: Self-pay | Admitting: Physician Assistant

## 2019-10-18 VITALS — BP 142/72 | HR 58 | Ht 63.0 in | Wt 127.8 lb

## 2019-10-18 DIAGNOSIS — Z7901 Long term (current) use of anticoagulants: Secondary | ICD-10-CM | POA: Diagnosis not present

## 2019-10-18 DIAGNOSIS — F419 Anxiety disorder, unspecified: Secondary | ICD-10-CM | POA: Diagnosis not present

## 2019-10-18 DIAGNOSIS — M1990 Unspecified osteoarthritis, unspecified site: Secondary | ICD-10-CM | POA: Diagnosis not present

## 2019-10-18 DIAGNOSIS — D6869 Other thrombophilia: Secondary | ICD-10-CM | POA: Diagnosis not present

## 2019-10-18 DIAGNOSIS — I998 Other disorder of circulatory system: Secondary | ICD-10-CM | POA: Diagnosis not present

## 2019-10-18 DIAGNOSIS — Z79899 Other long term (current) drug therapy: Secondary | ICD-10-CM | POA: Insufficient documentation

## 2019-10-18 DIAGNOSIS — Z888 Allergy status to other drugs, medicaments and biological substances status: Secondary | ICD-10-CM | POA: Insufficient documentation

## 2019-10-18 DIAGNOSIS — I10 Essential (primary) hypertension: Secondary | ICD-10-CM | POA: Insufficient documentation

## 2019-10-18 DIAGNOSIS — I480 Paroxysmal atrial fibrillation: Secondary | ICD-10-CM

## 2019-10-18 DIAGNOSIS — Z8249 Family history of ischemic heart disease and other diseases of the circulatory system: Secondary | ICD-10-CM | POA: Diagnosis not present

## 2019-10-18 DIAGNOSIS — I4892 Unspecified atrial flutter: Secondary | ICD-10-CM | POA: Diagnosis not present

## 2019-10-18 NOTE — Progress Notes (Signed)
Primary Care Physician: Caryl Bis, MD Primary Cardiologist: Dr Domenic Polite Primary Electrophysiologist: Dr Rayann Heman Referring Physician: Dr Vonzell Schlatter is a 79 y.o. female with a history of collagen vascular disease, HTN, and paroxysmal atrial fibrillation who presents for follow up in the [facility]. The patient was initially diagnosed with atrial fibrillation 03/10/2017 after the death of her husband. She continued to have symptomatic afib despite flecainide. Patient is on Eliquis for a CHADS2VASC score of 4. She underwent afib ablation with Dr Rayann Heman on 09/20/19. On 09/27/19, patient presented to the [facility] ED with symptoms of chest pain. She was found to be in afib with RVR and admitted. She underwent DCCV on 09/29/19 and her propafenone was continued. She denies any further heart racing. Her BB was decreased by Dr Domenic Polite 2/2 side effects. She denies CP, swallowing, or groin issues.   Today, she denies symptoms of palpitations, chest pain, shortness of breath, orthopnea, PND, lower extremity edema, dizziness, presyncope, syncope, snoring, daytime somnolence, bleeding, or neurologic sequela. The patient is tolerating medications without difficulties and is otherwise without complaint today.    Atrial Fibrillation Risk Factors:  she does not have symptoms or diagnosis of sleep apnea. she does not have a history of rheumatic fever.   she has a BMI of Body mass index is 22.64 kg/m.Marland Kitchen Filed Weights   10/18/19 1104  Weight: 58 kg    Family History  Problem Relation Age of Onset  . Hypertension Father      Atrial Fibrillation Management history:  Previous antiarrhythmic drugs: flecainide, propafenone  Previous cardioversions: 09/29/19 Previous ablations: 09/20/19 CHADS2VASC score: 4 Anticoagulation history: Eliquis   Past Medical History:  Diagnosis Date  . Anxiety   . Arthritis   . Cholecystolithiasis   . Collagen vascular disease  ([place])   . Cystocele with prolapse   . Hypertension   . PAF (paroxysmal atrial fibrillation) ([place])   . Rectocele    Past Surgical History:  Procedure Laterality Date  . ANTERIOR AND POSTERIOR REPAIR N/A 02/21/2018   Procedure: ANTERIOR (CYSTOCELE) REPAIR;  Surgeon: Bjorn Loser, MD;  Location: [facility];  Service: Urology;  Laterality: N/A;  . ATRIAL FIBRILLATION ABLATION N/A 09/19/2019   Procedure: ATRIAL FIBRILLATION ABLATION;  Surgeon: Thompson Grayer, MD;  Location: [facility];  Service: Cardiovascular;  Laterality: N/A;  . CARDIOVERSION N/A 09/29/2019   Procedure: CARDIOVERSION;  Surgeon: Evans Lance, MD;  Location: [facility];  Service: Cardiovascular;  Laterality: N/A;  . CYSTOSCOPY N/A 02/21/2018   Procedure: CYSTOSCOPY;  Surgeon: Bjorn Loser, MD;  Location[facility];  Service: Urology;  Laterality: N/A;  . EYE SURGERY Bilateral 2011   cataract extraction   . LAPAROSCOPIC VAGINAL HYSTERECTOMY WITH SALPINGO OOPHORECTOMY Bilateral 02/21/2018   Procedure: LAPAROSCOPIC ASSISTED VAGINAL HYSTERECTOMY WITH SALPINGO OOPHORECTOMY;  Surgeon: Maisie Fus, MD;  Location[facility];  Service: Gynecology;  Laterality: Bilateral;  Dr. Matilde Sprang to follow  . LEFT HEART CATH AND CORONARY ANGIOGRAPHY N/A 02/07/2019   Procedure: LEFT HEART CATH AND CORONARY ANGIOGRAPHY;  Surgeon: Burnell Blanks, MD;  Location: [facility];  Service: Cardiovascular;  Laterality: N/A;    Current Outpatient Medications  Medication Sig Dispense Refill  . acetaminophen (TYLENOL) 325 MG tablet Take 2 tablets (650 mg total) by mouth every 4 (four) hours as needed for headache or mild pain.    Marland Kitchen apixaban (ELIQUIS) 5 MG TABS tablet Take 1  tablet (5 mg total) by mouth 2 (two) times daily. 28 tablet 0  . atorvastatin (LIPITOR) 40 MG tablet Take 0.5 tablets (20 mg total) by mouth daily.    . Calcium Carbonate-Vitamin D (CALCARB 600/D PO) Take 1  tablet by mouth daily.     . cholecalciferol (VITAMIN D) 1000 units tablet Take 1,000 Units daily by mouth.    . COD LIVER OIL PO Take 1 capsule by mouth every other day.     . metoprolol tartrate (LOPRESSOR) 25 MG tablet Take 0.5 tablets (12.5 mg total) by mouth 2 (two) times daily. 30 tablet 6  . Multiple Vitamin (MULTIVITAMIN WITH MINERALS) TABS tablet Take 1 tablet daily by mouth.    . pantoprazole (PROTONIX) 40 MG tablet Take 40 mg by mouth daily as needed (heartburn).    . Potassium 99 MG TABS Take 99 mg by mouth 3 (three) times a week.     . propafenone (RYTHMOL) 150 MG tablet Take 1 tablet (150 mg total) by mouth every 8 (eight) hours. 90 tablet 1  . valsartan (DIOVAN) 40 MG tablet Take 1 tablet (40 mg total) by mouth daily. 90 tablet 3  . vitamin A 10000 UNIT capsule Take 10,000 Units by mouth every other day.     . zinc gluconate 50 MG tablet Take 50 mg by mouth every other day.    . zolpidem (AMBIEN) 5 MG tablet Take 5 mg by mouth at bedtime as needed for sleep.      No current facility-administered medications for this encounter.    Allergies  Allergen Reactions  . Phenobarbital Hives and Swelling  . Tamiflu [Oseltamivir Phosphate] Swelling and Rash  . Depakote [Valproic Acid] Hives and Itching  . Lisinopril Swelling    PT can only take low dosage    Social History   Socioeconomic History  . Marital status: Widowed    Spouse name: Not on file  . Number of children: Not on file  . Years of education: Not on file  . Highest education level: Not on file  Occupational History  . Occupation: retired  Tobacco Use  . Smoking status: Never Smoker  . Smokeless tobacco: Never Used  Vaping Use  . Vaping Use: Never used  Substance and Sexual Activity  . Alcohol use: No  . Drug use: No  . Sexual activity: Not on file  Other Topics Concern  . Not on file  Social History Narrative   Lives in [place] alone.  Widowed.   Retired.  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   Social Determinants of Health   Financial Resource Strain: Low Risk   . Difficulty of Paying Living Expenses: Not hard at all  Food Insecurity: No Food Insecurity  . Worried About Charity fundraiser in the Last Year: Never true  . Ran Out of Food in the Last Year: Never true  Transportation Needs: No Transportation Needs  . Lack of Transportation (Medical): No  . Lack of Transportation (Non-Medical): No  Physical Activity: Sufficiently Active  . Days of Exercise per Week: 7 days  . Minutes of Exercise per Session: 30 min  Stress: No Stress Concern Present  . Feeling of Stress : Not at all  Social Connections: Moderately Integrated  . Frequency of Communication with Friends and Family: More than three times a week  . Frequency of Social Gatherings with Friends and Family: More than three times a week  . Attends Religious Services: More  than 4 times per year  . Active Member of Clubs or Organizations: Yes  . Attends Archivist Meetings: More than 4 times per year  . Marital Status: Widowed  Intimate Partner Violence: Not At Risk  . Fear of Current or Ex-Partner: No  . Emotionally Abused: No  . Physically Abused: No  . Sexually Abused: No     ROS- All systems are reviewed and negative except as per the HPI above.  Physical Exam: Vitals:   10/18/19 1104  BP: (!) 142/72  Pulse: (!) 58  Weight: 58 kg  Height: 5\' 3"  (1.6 m)    GEN- The patient is well appearing elderly female, alert and oriented x 3 today.   Head- normocephalic, atraumatic Eyes-  Sclera clear, conjunctiva pink Ears- hearing intact Oropharynx- clear Neck- supple  Lungs- Clear to ausculation bilaterally, normal work of breathing Heart- Regular rate and rhythm, no murmurs, rubs or gallops  GI- soft, NT, ND, + BS Extremities- no clubbing, cyanosis, or edema MS- no significant deformity or atrophy Skin- no rash or lesion Psych- euthymic mood, full affect Neuro- strength and  sensation are intact  Wt Readings from Last 3 Encounters:  10/18/19 58 kg  10/17/19 57.6 kg  09/27/19 57.6 kg    EKG today demonstrates SB HR 58, PR 174, QRS 82, QTc 400  Echo 01/16/19 demonstrated  1. Left ventricular ejection fraction, by visual estimation, is 70 to  75%. The left ventricle has hyperdynamic function. Normal left ventricular  posterior wall thickness. There is no left ventricular hypertrophy.  2. Elevated mean left atrial pressure.  3. Left ventricular diastolic Doppler parameters are consistent with  impaired relaxation pattern of LV diastolic filling.  4. There is focal basal septal LV hypertrophy. In the setting of focal  basal septal hypertrophy and hyperdynamic LV function there is SAM of the  anterior mitral valve leaflet and a dynamic LVOT gradient with a peak of  32 mmHg.  5. Global right ventricle has normal systolic function.The right  ventricular size is normal. No increase in right ventricular wall  thickness.  6. Left atrial size was mildly dilated.  7. Right atrial size was normal.  8. Moderate mitral annular calcification.  9. The mitral valve is abnormal. Mild mitral valve regurgitation. No  evidence of mitral stenosis.  10. The tricuspid valve is normal in structure. Tricuspid valve  regurgitation is mild.  11. The aortic valve is tricuspid Aortic valve regurgitation was not  visualized by color flow Doppler. Structurally normal aortic valve, with  no evidence of sclerosis or stenosis.  12. The pulmonic valve was not well visualized. Pulmonic valve  regurgitation is not visualized by color flow Doppler.  13. Mildly elevated pulmonary artery systolic pressure.  14. PASP is borderline elevated at 30 mmHg.   In comparison to the previous echocardiogram(s): Echocradiogram done  03/01/17 shoed an EF of 65%.   Epic records are reviewed at length today  CHA2DS2-VASc Score = 4  [name] score is based upon: CHF History: 0 HTN  History: 1 Age : 2 Diabetes History: 0 Stroke History: 0 Vascular Disease History: 0 Gender: 1      ASSESSMENT AND PLAN: 1. Paroxysmal Atrial Fibrillation/atypical atrial flutter/atach [name] CHA2DS2-VASc score is 4, indicating a 4.8% annual risk of stroke.   S/p afib ablation 09/19/19. Multiple atypical atrial flutter circuits noted. Ectopic atrial tach also ablated.  S/p DCCV on 09/29/19 Patient appears to be maintaining SR.  Continue propafenone 150 mg TID  Continue Lopressor 12.5 mg BID Continue Eliquis 5 mg BID for at least 3 months post ablation with no missed doses. She will turn 79 y/o in Sept, with her weight <60kg will need to consider decreasing to 2.5 mg BID.  2. Secondary Hypercoagulable State (ICD10:  D68.69) The patient is at significant risk for stroke/thromboembolism based upon her CHA2DS2-VASc Score of 4.  Continue Apixaban (Eliquis).   3. HTN Stable, no changes today.   Follow up with Dr Rayann Heman and Dr Domenic Polite as scheduled. Patient prefers to follow up in [place] if possible in the future.    [facility] [address] [place], [place] [address] [phone] 10/18/2019 11:37 AM

## 2019-11-01 DIAGNOSIS — R300 Dysuria: Secondary | ICD-10-CM | POA: Diagnosis not present

## 2019-11-01 DIAGNOSIS — Z6822 Body mass index (BMI) 22.0-22.9, adult: Secondary | ICD-10-CM | POA: Diagnosis not present

## 2019-11-12 IMAGING — CR DG CHEST 1V PORT
1 series · 1 of 1 positions shown · non-contrast
Comparison: Chest x-ray dated February 28, 2017.

CLINICAL DATA: Sudden onset palpitations and blurry vision.

EXAM:
PORTABLE CHEST 1 VIEW

[portable]
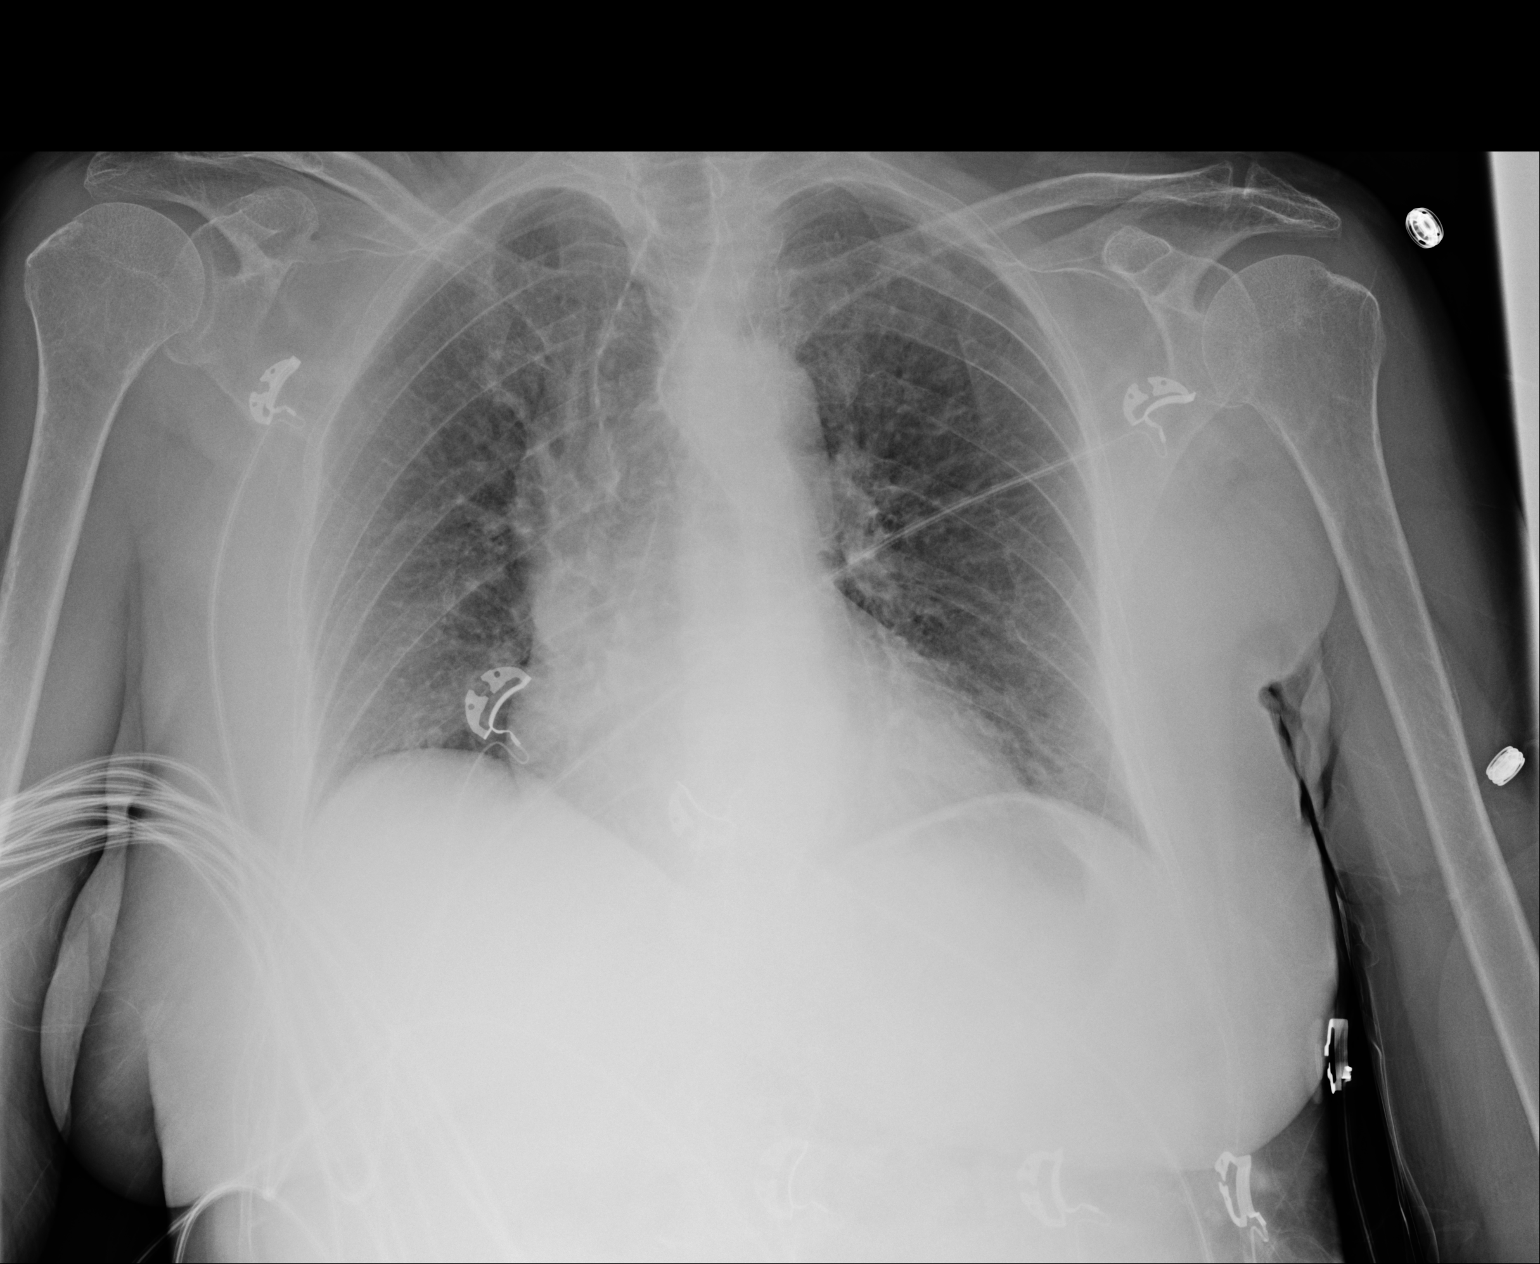

[1 of 1 positions shown; findings below may reference images not displayed]

FINDINGS: The heart size and mediastinal contours are within normal limits.
Both lungs are clear. The visualized skeletal structures are
unremarkable.
IMPRESSION: No active disease.

## 2019-11-27 ENCOUNTER — Telehealth: Payer: Self-pay | Admitting: Cardiology

## 2019-11-27 NOTE — Telephone Encounter (Signed)
Pt called stating her BP is 124/62 and pulse is 38  States no CP, no SOB- only feeling weak   Please call [phone]

## 2019-11-27 NOTE — Telephone Encounter (Signed)
HR checked with BP monitor and was 38 and felt like she was fatigue BP & HR rechecked while on phone and BP 137/72 & HR 68 Reports fatigue but was able to prepare lunch for grandson today Denies chest pain, dizziness or sob Advised that when having an abnormal heart rhythm that digital monitors don't always pick up an accurate HR or BP Advised to continue monitoring symptoms and if they get worse or continue to have a low HR, to call office to come in for an EKG.  Patient also reported that after she takes propafenone, 15-20 minutes later she feels her heart beat in her head that last for several minutes. Says she has discussed this with one of her providers and is unsure who she talked to about this but was told that she needed this medications temporarily after the DCCV and may be able to stop it.  Advised that message would be sent to provider and that his can also be discussed with Dr. Rayann Heman at her next visit on 12/27/19. Verbalized understanding.

## 2019-11-27 NOTE — Telephone Encounter (Signed)
Thank you for the update.  I would continue current medications as directed.  She has had an ablation, sometimes people can interrupt antiarrhythmic therapy depending on how well the rhythm is controlled, but I would not make any adjustments until she sees Dr. Rayann Heman.

## 2019-11-28 MED ORDER — PROPAFENONE HCL 150 MG PO TABS: 150.0000 mg | ORAL_TABLET | Freq: Three times a day (TID) | ORAL | 1 refills | Status: DC

## 2019-11-28 NOTE — Addendum Note (Signed)
Addended by: Merlene Laughter on: 11/28/2019 09:37 AM   Modules accepted: Orders

## 2019-11-28 NOTE — Telephone Encounter (Signed)
Patient informed and verbalized understanding of plan. 

## 2019-12-27 ENCOUNTER — Encounter: Payer: Self-pay | Admitting: Internal Medicine

## 2019-12-27 ENCOUNTER — Other Ambulatory Visit: Payer: Self-pay

## 2019-12-27 ENCOUNTER — Ambulatory Visit (INDEPENDENT_AMBULATORY_CARE_PROVIDER_SITE_OTHER): Payer: Medicare Other | Admitting: Internal Medicine

## 2019-12-27 VITALS — BP 130/74 | HR 58 | Ht 63.0 in | Wt 126.0 lb

## 2019-12-27 DIAGNOSIS — I10 Essential (primary) hypertension: Secondary | ICD-10-CM | POA: Diagnosis not present

## 2019-12-27 DIAGNOSIS — D6869 Other thrombophilia: Secondary | ICD-10-CM

## 2019-12-27 DIAGNOSIS — I480 Paroxysmal atrial fibrillation: Secondary | ICD-10-CM

## 2019-12-27 MED ORDER — VALSARTAN 80 MG PO TABS: 80.0000 mg | ORAL_TABLET | Freq: Every day | ORAL | 3 refills | Status: DC

## 2019-12-27 NOTE — Progress Notes (Signed)
PCP: Caryl Bis, MD  CC: afib  Kristen Mckenzie is a 79 y.o. female who presents today for routine electrophysiology followup.  Since his recent afib ablation, the patient reports doing very well.  she denies procedure related complications and is pleased with the results of the procedure.  Today, she denies symptoms of palpitations, chest pain, shortness of breath,  lower extremity edema, dizziness, presyncope, or syncope.  The patient is otherwise without complaint today.   Past Medical History:  Diagnosis Date  . Anxiety   . Arthritis   . Cholecystolithiasis   . Collagen vascular disease ([place])   . Cystocele with prolapse   . Hypertension   . PAF (paroxysmal atrial fibrillation) ([place])   . Rectocele    Past Surgical History:  Procedure Laterality Date  . ANTERIOR AND POSTERIOR REPAIR N/A 02/21/2018   Procedure: ANTERIOR (CYSTOCELE) REPAIR;  Surgeon: Bjorn Loser, MD;  Location: [facility];  Service: Urology;  Laterality: N/A;  . ATRIAL FIBRILLATION ABLATION N/A 09/19/2019   Procedure: ATRIAL FIBRILLATION ABLATION;  Surgeon: Thompson Grayer, MD;  Location: [facility];  Service: Cardiovascular;  Laterality: N/A;  . CARDIOVERSION N/A 09/29/2019   Procedure: CARDIOVERSION;  Surgeon: Evans Lance, MD;  Location: [facility];  Service: Cardiovascular;  Laterality: N/A;  . CYSTOSCOPY N/A 02/21/2018   Procedure: CYSTOSCOPY;  Surgeon: Bjorn Loser, MD;  Location: [facility];  Service: Urology;  Laterality: N/A;  . EYE SURGERY Bilateral 2011   cataract extraction   . LAPAROSCOPIC VAGINAL HYSTERECTOMY WITH SALPINGO OOPHORECTOMY Bilateral 02/21/2018   Procedure: LAPAROSCOPIC ASSISTED VAGINAL HYSTERECTOMY WITH SALPINGO OOPHORECTOMY;  Surgeon: Maisie Fus, MD;  Location: [facility];  Service: Gynecology;  Laterality: Bilateral;  Dr. Matilde Sprang to follow  . LEFT HEART CATH AND CORONARY ANGIOGRAPHY N/A 02/07/2019   Procedure: LEFT  HEART CATH AND CORONARY ANGIOGRAPHY;  Surgeon: Burnell Blanks, MD;  Location: [facility];  Service: Cardiovascular;  Laterality: N/A;    ROS- all systems are personally reviewed and negatives except as per HPI above  Current Outpatient Medications  Medication Sig Dispense Refill  . acetaminophen (TYLENOL) 325 MG tablet Take 2 tablets (650 mg total) by mouth every 4 (four) hours as needed for headache or mild pain.    Marland Kitchen apixaban (ELIQUIS) 5 MG TABS tablet Take 1 tablet (5 mg total) by mouth 2 (two) times daily. 28 tablet 0  . atorvastatin (LIPITOR) 40 MG tablet Take 0.5 tablets (20 mg total) by mouth daily.    . Calcium Carbonate-Vitamin D (CALCARB 600/D PO) Take 1 tablet by mouth daily.     . cholecalciferol (VITAMIN D) 1000 units tablet Take 1,000 Units daily by mouth.    . COD LIVER OIL PO Take 1 capsule by mouth every other day.     . metoprolol tartrate (LOPRESSOR) 25 MG tablet Take 0.5 tablets (12.5 mg total) by mouth 2 (two) times daily. 30 tablet 6  . Multiple Vitamin (MULTIVITAMIN WITH MINERALS) TABS tablet Take 1 tablet daily by mouth.    . pantoprazole (PROTONIX) 40 MG tablet Take 40 mg by mouth daily as needed (heartburn).    . Potassium 99 MG TABS Take 99 mg by mouth 3 (three) times a week.     . valsartan (DIOVAN) 80 MG tablet Take 1 tablet (80 mg total) by mouth daily. 90 tablet 3  . vitamin A 10000 UNIT capsule Take 10,000 Units by mouth every other day.     Marland Kitchen  zinc gluconate 50 MG tablet Take 50 mg by mouth every other day.    . zolpidem (AMBIEN) 5 MG tablet Take 5 mg by mouth at bedtime as needed for sleep.      No current facility-administered medications for this visit.    Physical Exam: Vitals:   12/27/19 1202  BP: 130/74  Pulse: (!) 58  SpO2: 96%  Weight: 126 lb (57.2 kg)  Height: 5\' 3"  (1.6 m)    GEN- The patient is well appearing, alert and oriented x 3 today.   Head- normocephalic, atraumatic Eyes-  Sclera clear, conjunctiva pink Ears-  hearing intact Oropharynx- clear Lungs- Clear to ausculation bilaterally, normal work of breathing Heart- Regular rate and rhythm, no murmurs, rubs or gallops, PMI not laterally displaced GI- soft, NT, ND, + BS Extremities- no clubbing, cyanosis, or edema  EKG tracing ordered today is personally reviewed and shows sinus bradycardia  Assessment and Plan:  1. Paroxysmal atrial fibrillation/ atypical atrial flutter Doing well s/p ablation chads2vasc score is 4. She is on eliquis Stop propafenone today  2. HTN She worries about frequent elevations in her BP I have reviewed her BP journal at length.  She does have some SBPs 170s, 180s Increase diovan to 80mg  daily We discussed BP management at length today  3.  HL Continue statin  Return to see me in 3 months  Thompson Grayer MD, [facility] 12/27/2019 12:21 PM

## 2019-12-27 NOTE — Patient Instructions (Signed)
Medication Instructions:   Stop Rythmol (Propafenone).    Increase Valsartan to 80mg  daily.   Continue all other medications.    Labwork: none  Testing/Procedures: none  Follow-Up: [facility] - 3 months   Any Other Special Instructions Will Be Listed Below (If Applicable).  If you need a refill on your cardiac medications before your next appointment, please call your pharmacy.

## 2019-12-31 ENCOUNTER — Telehealth: Payer: Self-pay | Admitting: Internal Medicine

## 2019-12-31 NOTE — Telephone Encounter (Signed)
Reports feeling intermittent heart beats in the back of her neck that goes up her head. Reports it doesn't feel like a headache. Says she did speak with [facility] on Friday about this. Says she thinks its caused by her blood pressure. Today, BP was 120/57 & HR 65 at 1:00 pm when she felt the sensation of her heart beat in her head & again at 4:00 pm, BP 144/64 & HR 69. Reports that after she massages the area of her head,the pulsation goes away. Denies chest pain, sob, blurred vision. Advised to contact her PCP as well and message would be sent to provider. Verbalized understanding of plan.

## 2019-12-31 NOTE — Addendum Note (Signed)
Addended by: Merlene Laughter on: 12/31/2019 03:10 PM   Modules accepted: Orders

## 2019-12-31 NOTE — Telephone Encounter (Signed)
Seen by Dr. Rayann Heman on 12-27-2019. States that he switched her medication . She feels like she has a heartbeat in her head. No dizziness, shortness of breath or blurred vision.

## 2020-01-02 DIAGNOSIS — R519 Headache, unspecified: Secondary | ICD-10-CM | POA: Diagnosis not present

## 2020-01-02 DIAGNOSIS — Z6822 Body mass index (BMI) 22.0-22.9, adult: Secondary | ICD-10-CM | POA: Diagnosis not present

## 2020-01-02 DIAGNOSIS — I10 Essential (primary) hypertension: Secondary | ICD-10-CM | POA: Diagnosis not present

## 2020-01-10 DIAGNOSIS — I10 Essential (primary) hypertension: Secondary | ICD-10-CM | POA: Diagnosis not present

## 2020-01-10 DIAGNOSIS — R519 Headache, unspecified: Secondary | ICD-10-CM | POA: Diagnosis not present

## 2020-01-28 DIAGNOSIS — H35363 Drusen (degenerative) of macula, bilateral: Secondary | ICD-10-CM | POA: Diagnosis not present

## 2020-02-05 DIAGNOSIS — E782 Mixed hyperlipidemia: Secondary | ICD-10-CM | POA: Diagnosis not present

## 2020-02-05 DIAGNOSIS — K2100 Gastro-esophageal reflux disease with esophagitis, without bleeding: Secondary | ICD-10-CM | POA: Diagnosis not present

## 2020-02-05 DIAGNOSIS — I10 Essential (primary) hypertension: Secondary | ICD-10-CM | POA: Diagnosis not present

## 2020-02-07 DIAGNOSIS — E782 Mixed hyperlipidemia: Secondary | ICD-10-CM | POA: Diagnosis not present

## 2020-02-07 DIAGNOSIS — I10 Essential (primary) hypertension: Secondary | ICD-10-CM | POA: Diagnosis not present

## 2020-02-07 DIAGNOSIS — I480 Paroxysmal atrial fibrillation: Secondary | ICD-10-CM | POA: Diagnosis not present

## 2020-02-07 DIAGNOSIS — R002 Palpitations: Secondary | ICD-10-CM | POA: Diagnosis not present

## 2020-02-07 DIAGNOSIS — F331 Major depressive disorder, recurrent, moderate: Secondary | ICD-10-CM | POA: Diagnosis not present

## 2020-02-07 DIAGNOSIS — R4582 Worries: Secondary | ICD-10-CM | POA: Diagnosis not present

## 2020-02-07 DIAGNOSIS — Z6822 Body mass index (BMI) 22.0-22.9, adult: Secondary | ICD-10-CM | POA: Diagnosis not present

## 2020-02-07 DIAGNOSIS — M19049 Primary osteoarthritis, unspecified hand: Secondary | ICD-10-CM | POA: Diagnosis not present

## 2020-02-18 ENCOUNTER — Encounter: Payer: Self-pay | Admitting: Cardiology

## 2020-02-18 ENCOUNTER — Ambulatory Visit (INDEPENDENT_AMBULATORY_CARE_PROVIDER_SITE_OTHER): Payer: Medicare Other | Admitting: Cardiology

## 2020-02-18 VITALS — BP 142/68 | HR 80 | Ht 64.0 in | Wt 125.0 lb

## 2020-02-18 DIAGNOSIS — I10 Essential (primary) hypertension: Secondary | ICD-10-CM

## 2020-02-18 DIAGNOSIS — I480 Paroxysmal atrial fibrillation: Secondary | ICD-10-CM | POA: Diagnosis not present

## 2020-02-18 MED ORDER — SPIRONOLACTONE 25 MG PO TABS: 12.5000 mg | ORAL_TABLET | Freq: Every day | ORAL | 1 refills | Status: DC

## 2020-02-18 MED ORDER — APIXABAN 5 MG PO TABS: 5.0000 mg | ORAL_TABLET | Freq: Two times a day (BID) | ORAL | 3 refills | Status: DC

## 2020-02-18 NOTE — Patient Instructions (Addendum)
Medication Instructions:    Your physician has recommended you make the following change in your medication:   Start spironolactone 12.5 mg by mouth daily  Continue other medications the same  Labwork:  Your physician recommends that you return for non-fasting lab work in: 7-10 days to check your BMET. This may be done at [facility] or [facility] ([place]) Monday-Friday from 8:00 am - 4:00 pm. No appointment is needed.  Testing/Procedures:  None  Follow-Up:  Your physician recommends that you schedule a follow-up appointment in: 3 months.   Any Other Special Instructions Will Be Listed Below (If Applicable).  If you need a refill on your cardiac medications before your next appointment, please call your pharmacy.

## 2020-02-18 NOTE — Progress Notes (Signed)
Cardiology Office Note  Date: 02/18/2020   ID: Kristen, Mckenzie 02/21/1941, MRN [id number]  PCP:  Caryl Bis, MD  Cardiologist:  Rozann Lesches, MD Electrophysiologist:  Thompson Grayer, MD   Chief Complaint  Patient presents with   Cardiac follow-up     History of Present Illness: Kristen Mckenzie is an 79 y.o. female last seen in June.  She presents for a follow-up visit.  She reports intermittent brief palpitations and lightheadedness, has had no obvious sustained atrial arrhythmias however.  I personally reviewed her ECG today which shows normal sinus rhythm with low voltage and decreased R wave progression.  Follow-up visit noted with Dr. Rayann Heman in September.  At that time she was taken off propafenone and Diovan was uptitrated for better blood pressure control.  She continues to have trouble with optimal blood pressure control.  She has seen her PCP with subsequent up titration of Diovan to 320 mg daily.  She has not tolerated a further increase in Lopressor beyond 12.5 mg twice daily, feels very fatigued at higher dose.  We discussed initiation of low-dose diuretic.  Past Medical History:  Diagnosis Date   Anxiety    Arthritis    Cholecystolithiasis    Collagen vascular disease ([place])    Cystocele with prolapse    Hypertension    PAF (paroxysmal atrial fibrillation) ([place])    Rectocele     Past Surgical History:  Procedure Laterality Date   ANTERIOR AND POSTERIOR REPAIR N/A 02/21/2018   Procedure: ANTERIOR (CYSTOCELE) REPAIR;  Surgeon: Bjorn Loser, MD;  Location: [facility];  Service: Urology;  Laterality: N/A;   ATRIAL FIBRILLATION ABLATION N/A 09/19/2019   Procedure: ATRIAL FIBRILLATION ABLATION;  Surgeon: Thompson Grayer, MD;  Location: [facility];  Service: Cardiovascular;  Laterality: N/A;   CARDIOVERSION N/A 09/29/2019   Procedure: CARDIOVERSION;  Surgeon: Evans Lance, MD;  Location: [facility];  Service:  Cardiovascular;  Laterality: N/A;   CYSTOSCOPY N/A 02/21/2018   Procedure: CYSTOSCOPY;  Surgeon: Bjorn Loser, MD;  Location: [facility];  Service: Urology;  Laterality: N/A;   EYE SURGERY Bilateral 2011   cataract extraction    LAPAROSCOPIC VAGINAL HYSTERECTOMY WITH SALPINGO OOPHORECTOMY Bilateral 02/21/2018   Procedure: LAPAROSCOPIC ASSISTED VAGINAL HYSTERECTOMY WITH SALPINGO OOPHORECTOMY;  Surgeon: Maisie Fus, MD;  Location: [facility];  Service: Gynecology;  Laterality: Bilateral;  Dr. Matilde Sprang to follow   LEFT HEART CATH AND CORONARY ANGIOGRAPHY N/A 02/07/2019   Procedure: LEFT HEART CATH AND CORONARY ANGIOGRAPHY;  Surgeon: Burnell Blanks, MD;  Location: [facility];  Service: Cardiovascular;  Laterality: N/A;    Current Outpatient Medications  Medication Sig Dispense Refill   acetaminophen (TYLENOL) 325 MG tablet Take 2 tablets (650 mg total) by mouth every 4 (four) hours as needed for headache or mild pain.     apixaban (ELIQUIS) 5 MG TABS tablet Take 1 tablet (5 mg total) by mouth 2 (two) times daily. 180 tablet 3   atorvastatin (LIPITOR) 40 MG tablet Take 0.5 tablets (20 mg total) by mouth daily.     Calcium Carbonate-Vitamin D (CALCARB 600/D PO) Take 1 tablet by mouth daily.      cholecalciferol (VITAMIN D) 1000 units tablet Take 1,000 Units daily by mouth.     COD LIVER OIL PO Take 1 capsule by mouth every other day.      metoprolol tartrate (LOPRESSOR) 25 MG tablet Take 0.5 tablets (12.5 mg total) by  mouth 2 (two) times daily. 30 tablet 6   Multiple Vitamin (MULTIVITAMIN WITH MINERALS) TABS tablet Take 1 tablet daily by mouth.     pantoprazole (PROTONIX) 40 MG tablet Take 40 mg by mouth daily as needed (heartburn).     Potassium 99 MG TABS Take 99 mg by mouth 3 (three) times a week.      valsartan (DIOVAN) 320 MG tablet Take 320 mg by mouth daily.     vitamin A 10000 UNIT capsule Take 10,000 Units by mouth  every other day.      zinc gluconate 50 MG tablet Take 50 mg by mouth every other day.     zolpidem (AMBIEN) 5 MG tablet Take 5 mg by mouth at bedtime as needed for sleep.      spironolactone (ALDACTONE) 25 MG tablet Take 0.5 tablets (12.5 mg total) by mouth daily. 45 tablet 1   No current facility-administered medications for this visit.   Allergies:  Phenobarbital, Tamiflu [oseltamivir phosphate], Depakote [valproic acid], and Lisinopril   ROS: No syncope.  Physical Exam: VS:  BP (!) 142/68    Pulse 80    Ht 5\' 4"  (1.626 m)    Wt 125 lb (56.7 kg)    SpO2 97%    BMI 21.46 kg/m , BMI Body mass index is 21.46 kg/m.  Wt Readings from Last 3 Encounters:  02/18/20 125 lb (56.7 kg)  12/27/19 126 lb (57.2 kg)  10/18/19 127 lb 12.8 oz (58 kg)    General: Elderly woman, appears comfortable at rest. HEENT: Conjunctiva and lids normal, wearing a mask. Neck: Supple, no elevated JVP or carotid bruits, no thyromegaly. Lungs: Clear to auscultation, nonlabored breathing at rest. Cardiac: Regular rate and rhythm, no S3, 2/6 systolic murmur, no pericardial rub. Extremities: No pitting edema.  ECG:  An ECG dated 12/27/2019 was personally reviewed today and demonstrated:  Sinus bradycardia with low voltage.  Recent Labwork: 07/03/2019: Magnesium 2.0 09/27/2019: ALT 28; AST 27; BUN 19; Creatinine, Ser 0.65; Potassium 3.8; Sodium 134 09/29/2019: Hemoglobin 11.8; Platelets 256   Other Studies Reviewed Today:  Cardiac catheterization 02/07/2019:  The left ventricular systolic function is normal.  LV end diastolic pressure is normal.  The left ventricular ejection fraction is greater than 65% by visual estimate.  There is no mitral valve regurgitation.  1. No angiographic evidence of CAD 2. Normal LV systolic function' 3. Elevation in troponin likely due to demand ischemia in setting of rapid atrial fibrillation  Echocardiogram 01/16/2019: 1. Left ventricular ejection fraction, by visual  estimation, is 70 to  75%. The left ventricle has hyperdynamic function. Normal left ventricular  posterior wall thickness. There is no left ventricular hypertrophy.  2. Elevated mean left atrial pressure.  3. Left ventricular diastolic Doppler parameters are consistent with  impaired relaxation pattern of LV diastolic filling.  4. There is focal basal septal LV hypertrophy. In the setting of focal  basal septal hypertrophy and hyperdynamic LV function there is SAM of the  anterior mitral valve leaflet and a dynamic LVOT gradient with a peak of  32 mmHg.  5. Global right ventricle has normal systolic function.The right  ventricular size is normal. No increase in right ventricular wall  thickness.  6. Left atrial size was mildly dilated.  7. Right atrial size was normal.  8. Moderate mitral annular calcification.  9. The mitral valve is abnormal. Mild mitral valve regurgitation. No  evidence of mitral stenosis.  10. The tricuspid valve is normal in structure. Tricuspid  valve  regurgitation is mild.  11. The aortic valve is tricuspid Aortic valve regurgitation was not  visualized by color flow Doppler. Structurally normal aortic valve, with  no evidence of sclerosis or stenosis.  12. The pulmonic valve was not well visualized. Pulmonic valve  regurgitation is not visualized by color flow Doppler.  13. Mildly elevated pulmonary artery systolic pressure.  14. PASP is borderline elevated at 30 mmHg.   Assessment and Plan:  1.  Paroxysmal atrial fibrillation status post atrial fibrillation ablation.  CHA2DS2-VASc score is 4.  She continues on Eliquis for stroke prophylaxis, does have intermittent palpitations but no documented sustained arrhythmia.  She was taken off propafenone by Dr. Rayann Heman.  She continues on low-dose Lopressor, not able to tolerate up titration.  No reported spontaneous bleeding problems.  2.  Essential hypertension, blood pressure is not optimally controlled.   Diovan has been uptitrated to 320 mg daily by PCP.  Add Aldactone 12.5 mg daily.  Follow-up BMET in 7 to 10 days.  Medication Adjustments/Labs and Tests Ordered: Current medicines are reviewed at length with the patient today.  Concerns regarding medicines are outlined above.   Tests Ordered: Orders Placed This Encounter  Procedures   Basic metabolic panel   EKG [id number]    Medication Changes: Meds ordered this encounter  Medications   apixaban (ELIQUIS) 5 MG TABS tablet    Sig: Take 1 tablet (5 mg total) by mouth 2 (two) times daily.    Dispense:  180 tablet    Refill:  3   spironolactone (ALDACTONE) 25 MG tablet    Sig: Take 0.5 tablets (12.5 mg total) by mouth daily.    Dispense:  45 tablet    Refill:  1    02/18/2020 NEW    Disposition:  Follow up 3 months in the [place] office.  Signed, Satira Sark, MD, [facility] 02/18/2020 4:45 PM    [facility] [address], [place], [place] [address] Phone: [phone]; Fax: [phone]

## 2020-02-19 DIAGNOSIS — N958 Other specified menopausal and perimenopausal disorders: Secondary | ICD-10-CM | POA: Diagnosis not present

## 2020-02-19 DIAGNOSIS — Z01419 Encounter for gynecological examination (general) (routine) without abnormal findings: Secondary | ICD-10-CM | POA: Diagnosis not present

## 2020-02-19 DIAGNOSIS — Z6822 Body mass index (BMI) 22.0-22.9, adult: Secondary | ICD-10-CM | POA: Diagnosis not present

## 2020-02-19 DIAGNOSIS — M816 Localized osteoporosis [Lequesne]: Secondary | ICD-10-CM | POA: Diagnosis not present

## 2020-03-04 ENCOUNTER — Telehealth: Payer: Self-pay | Admitting: Cardiology

## 2020-03-04 NOTE — Telephone Encounter (Signed)
New message     patient wants a call back today she doesn't think the medication she is on is not controlling her bp, she wants to speak to Dr Domenic Polite before she leaves on Friday

## 2020-03-05 ENCOUNTER — Telehealth: Payer: Self-pay | Admitting: *Deleted

## 2020-03-05 ENCOUNTER — Other Ambulatory Visit (HOSPITAL_COMMUNITY)
Admission: RE | Admit: 2020-03-05 | Discharge: 2020-03-05 | Disposition: A | Discharge status: Home / Self Care | Payer: Medicare Other | Source: Ambulatory Visit | Attending: Cardiology | Admitting: Cardiology

## 2020-03-05 ENCOUNTER — Other Ambulatory Visit: Payer: Self-pay

## 2020-03-05 DIAGNOSIS — I480 Paroxysmal atrial fibrillation: Secondary | ICD-10-CM | POA: Insufficient documentation

## 2020-03-05 LAB — BASIC METABOLIC PANEL
Anion gap: 8 (ref 5–15)
BUN: 15 mg/dL (ref 8–23)
CO2: 27 mmol/L (ref 22–32)
Calcium: 9.3 mg/dL (ref 8.9–10.3)
Chloride: 101 mmol/L (ref 98–111)
Creatinine, Ser: 0.68 mg/dL (ref 0.44–1.00)
GFR, Estimated: >60 mL/min (ref >60)
Glucose, Bld: 96 mg/dL (ref 70–99)
Potassium: 4.1 mmol/L (ref 3.5–5.1)
Sodium: 136 mmol/L (ref 135–145)

## 2020-03-05 NOTE — Telephone Encounter (Signed)
-----   Message from Satira Sark, MD sent at 03/05/2020  3:11 PM EST ----- Results reviewed.  Renal function and potassium were normal after recent initiation of Aldactone.  If blood pressure remains elevated, could increase dose to 25 mg daily.

## 2020-03-05 NOTE — Telephone Encounter (Addendum)
Reports BP at night has been averaging around 168/78 and BP yesterday morning, BP was 164/70. Reports checking her BP at random times of the day without consistency. Reports headaches for 2-3 months that is being treated by Dr. Pleas Koch. Advised to contact his office to re-evaluate headaches. Reports taking tylenol for headache with a little bit of relief. Reports home BP monitor has been checked for accuracy. Medications reviewed. Advised to check BP daily, 1-2 hours after BP medications, record numbers and call office next week with update. Verbalized understanding of plan.

## 2020-03-05 NOTE — Telephone Encounter (Signed)
Returning call - please call [phone]

## 2020-03-06 MED ORDER — SPIRONOLACTONE 25 MG PO TABS: 25.0000 mg | ORAL_TABLET | Freq: Every day | ORAL | 1 refills | Status: DC

## 2020-03-06 NOTE — Telephone Encounter (Signed)
Patient informed and will go ahead and increase spironolactone to 25 mg daily. New rx sent. Copy sent to PCP

## 2020-04-07 DIAGNOSIS — F331 Major depressive disorder, recurrent, moderate: Secondary | ICD-10-CM | POA: Diagnosis not present

## 2020-04-07 DIAGNOSIS — I10 Essential (primary) hypertension: Secondary | ICD-10-CM | POA: Diagnosis not present

## 2020-04-07 DIAGNOSIS — R4582 Worries: Secondary | ICD-10-CM | POA: Diagnosis not present

## 2020-04-07 DIAGNOSIS — I480 Paroxysmal atrial fibrillation: Secondary | ICD-10-CM | POA: Diagnosis not present

## 2020-04-07 DIAGNOSIS — E782 Mixed hyperlipidemia: Secondary | ICD-10-CM | POA: Diagnosis not present

## 2020-04-07 DIAGNOSIS — R002 Palpitations: Secondary | ICD-10-CM | POA: Diagnosis not present

## 2020-04-07 DIAGNOSIS — Z6822 Body mass index (BMI) 22.0-22.9, adult: Secondary | ICD-10-CM | POA: Diagnosis not present

## 2020-04-07 DIAGNOSIS — Z0001 Encounter for general adult medical examination with abnormal findings: Secondary | ICD-10-CM | POA: Diagnosis not present

## 2020-04-07 IMAGING — DX DG CHEST 1V PORT
1 series · 1 of 1 positions shown · non-contrast
Comparison: 02/06/2019 chest radiograph.

CLINICAL DATA: Tachycardia

EXAM:
PORTABLE CHEST 1 VIEW

[chest ap]
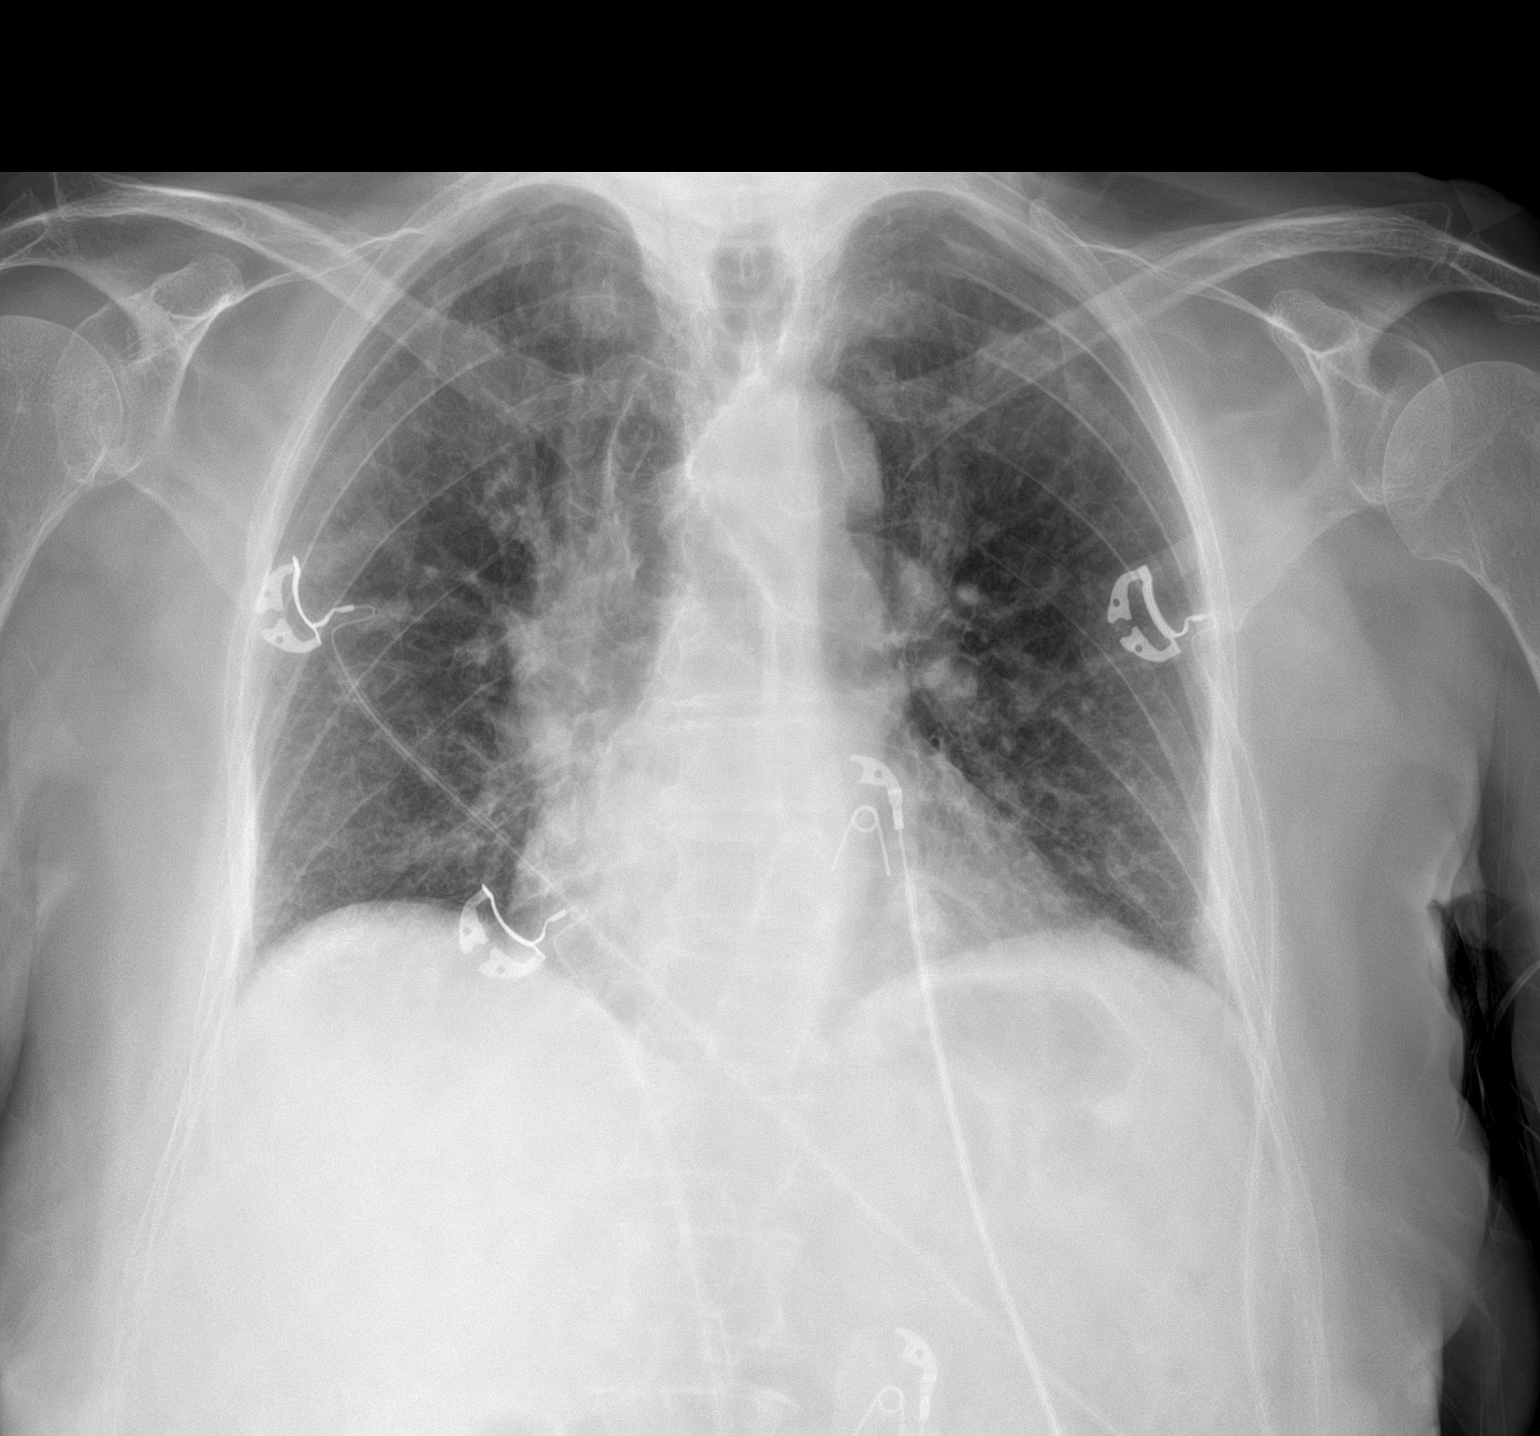

[1 of 1 positions shown; findings below may reference images not displayed]

FINDINGS: Stable cardiomediastinal silhouette with normal heart size. No
pneumothorax. No pleural effusion. Lungs appear clear, with no acute
consolidative airspace disease and no pulmonary edema.
IMPRESSION: No active disease.

## 2020-04-21 DIAGNOSIS — J45909 Unspecified asthma, uncomplicated: Secondary | ICD-10-CM | POA: Diagnosis not present

## 2020-04-21 DIAGNOSIS — Z20828 Contact with and (suspected) exposure to other viral communicable diseases: Secondary | ICD-10-CM | POA: Diagnosis not present

## 2020-04-21 DIAGNOSIS — R059 Cough, unspecified: Secondary | ICD-10-CM | POA: Diagnosis not present

## 2020-04-23 DIAGNOSIS — Z20828 Contact with and (suspected) exposure to other viral communicable diseases: Secondary | ICD-10-CM | POA: Diagnosis not present

## 2020-05-01 ENCOUNTER — Ambulatory Visit: Payer: Medicare Other | Admitting: Internal Medicine

## 2020-05-05 DIAGNOSIS — J45909 Unspecified asthma, uncomplicated: Secondary | ICD-10-CM | POA: Diagnosis not present

## 2020-05-05 DIAGNOSIS — R059 Cough, unspecified: Secondary | ICD-10-CM | POA: Diagnosis not present

## 2020-05-09 ENCOUNTER — Inpatient Hospital Stay (HOSPITAL_COMMUNITY)
Admission: EM | Admit: 2020-05-09 | Discharge: 2020-05-12 | DRG: 177 | Disposition: A | Discharge status: Home / Self Care | Payer: Medicare Other | Attending: Internal Medicine | Admitting: Internal Medicine

## 2020-05-09 ENCOUNTER — Encounter (HOSPITAL_COMMUNITY): Payer: Self-pay | Admitting: Emergency Medicine

## 2020-05-09 ENCOUNTER — Emergency Department (HOSPITAL_COMMUNITY): Payer: Medicare Other

## 2020-05-09 ENCOUNTER — Other Ambulatory Visit: Payer: Self-pay

## 2020-05-09 DIAGNOSIS — J189 Pneumonia, unspecified organism: Secondary | ICD-10-CM | POA: Diagnosis not present

## 2020-05-09 DIAGNOSIS — A0839 Other viral enteritis: Secondary | ICD-10-CM | POA: Diagnosis present

## 2020-05-09 DIAGNOSIS — I2510 Atherosclerotic heart disease of native coronary artery without angina pectoris: Secondary | ICD-10-CM | POA: Diagnosis present

## 2020-05-09 DIAGNOSIS — D6959 Other secondary thrombocytopenia: Secondary | ICD-10-CM | POA: Diagnosis present

## 2020-05-09 DIAGNOSIS — I951 Orthostatic hypotension: Secondary | ICD-10-CM | POA: Diagnosis present

## 2020-05-09 DIAGNOSIS — J9601 Acute respiratory failure with hypoxia: Secondary | ICD-10-CM | POA: Diagnosis present

## 2020-05-09 DIAGNOSIS — Z7901 Long term (current) use of anticoagulants: Secondary | ICD-10-CM | POA: Diagnosis not present

## 2020-05-09 DIAGNOSIS — E860 Dehydration: Secondary | ICD-10-CM

## 2020-05-09 DIAGNOSIS — Z79899 Other long term (current) drug therapy: Secondary | ICD-10-CM

## 2020-05-09 DIAGNOSIS — R52 Pain, unspecified: Secondary | ICD-10-CM | POA: Diagnosis not present

## 2020-05-09 DIAGNOSIS — I4891 Unspecified atrial fibrillation: Secondary | ICD-10-CM | POA: Diagnosis not present

## 2020-05-09 DIAGNOSIS — R059 Cough, unspecified: Secondary | ICD-10-CM | POA: Diagnosis not present

## 2020-05-09 DIAGNOSIS — S0990XA Unspecified injury of head, initial encounter: Secondary | ICD-10-CM | POA: Diagnosis not present

## 2020-05-09 DIAGNOSIS — I480 Paroxysmal atrial fibrillation: Secondary | ICD-10-CM | POA: Diagnosis present

## 2020-05-09 DIAGNOSIS — A419 Sepsis, unspecified organism: Secondary | ICD-10-CM

## 2020-05-09 DIAGNOSIS — R791 Abnormal coagulation profile: Secondary | ICD-10-CM | POA: Diagnosis present

## 2020-05-09 DIAGNOSIS — U071 COVID-19: Principal | ICD-10-CM | POA: Diagnosis present

## 2020-05-09 DIAGNOSIS — J1282 Pneumonia due to coronavirus disease 2019: Secondary | ICD-10-CM | POA: Diagnosis present

## 2020-05-09 DIAGNOSIS — R55 Syncope and collapse: Secondary | ICD-10-CM

## 2020-05-09 DIAGNOSIS — E785 Hyperlipidemia, unspecified: Secondary | ICD-10-CM | POA: Diagnosis present

## 2020-05-09 DIAGNOSIS — E871 Hypo-osmolality and hyponatremia: Secondary | ICD-10-CM | POA: Diagnosis present

## 2020-05-09 DIAGNOSIS — I10 Essential (primary) hypertension: Secondary | ICD-10-CM | POA: Diagnosis present

## 2020-05-09 DIAGNOSIS — R197 Diarrhea, unspecified: Secondary | ICD-10-CM | POA: Diagnosis present

## 2020-05-09 DIAGNOSIS — I471 Supraventricular tachycardia: Secondary | ICD-10-CM | POA: Diagnosis present

## 2020-05-09 DIAGNOSIS — I252 Old myocardial infarction: Secondary | ICD-10-CM

## 2020-05-09 DIAGNOSIS — R531 Weakness: Secondary | ICD-10-CM | POA: Diagnosis not present

## 2020-05-09 DIAGNOSIS — R0602 Shortness of breath: Secondary | ICD-10-CM | POA: Diagnosis not present

## 2020-05-09 DIAGNOSIS — R0902 Hypoxemia: Secondary | ICD-10-CM | POA: Diagnosis not present

## 2020-05-09 LAB — BASIC METABOLIC PANEL
Anion gap: 6 (ref 5–15)
BUN: 21 mg/dL (ref 8–23)
CO2: 20 mmol/L — ABNORMAL LOW (ref 22–32)
Calcium: 7.8 mg/dL — ABNORMAL LOW (ref 8.9–10.3)
Chloride: 101 mmol/L (ref 98–111)
Creatinine, Ser: 0.67 mg/dL (ref 0.44–1.00)
GFR, Estimated: >60 mL/min (ref >60)
Glucose, Bld: 98 mg/dL (ref 70–99)
Potassium: 4.1 mmol/L (ref 3.5–5.1)
Sodium: 127 mmol/L — ABNORMAL LOW (ref 135–145)

## 2020-05-09 LAB — CBC WITH DIFFERENTIAL/PLATELET
Abs Immature Granulocytes: 0 10*3/uL (ref 0.00–0.07)
Basophils Absolute: 0 10*3/uL (ref 0.0–0.1)
Basophils Relative: 0 %
Eosinophils Absolute: 0 10*3/uL (ref 0.0–0.5)
Eosinophils Relative: 0 %
HCT: 33.4 % — ABNORMAL LOW (ref 36.0–46.0)
Hemoglobin: 11.6 g/dL — ABNORMAL LOW (ref 12.0–15.0)
Immature Granulocytes: 0 %
Lymphocytes Relative: 8 %
Lymphs Abs: 0.3 10*3/uL — ABNORMAL LOW (ref 0.7–4.0)
MCH: 32.3 pg (ref 26.0–34.0)
MCHC: 34.7 g/dL (ref 30.0–36.0)
MCV: 93 fL (ref 80.0–100.0)
Monocytes Absolute: 0.2 10*3/uL (ref 0.1–1.0)
Monocytes Relative: 5 %
Neutro Abs: 3.7 10*3/uL (ref 1.7–7.7)
Neutrophils Relative %: 87 %
Platelets: 114 10*3/uL — ABNORMAL LOW (ref 150–400)
RBC: 3.59 MIL/uL — ABNORMAL LOW (ref 3.87–5.11)
RDW: 12.5 % (ref 11.5–15.5)
WBC: 4.3 10*3/uL (ref 4.0–10.5)
nRBC: 0 % (ref 0.0–0.2)

## 2020-05-09 LAB — COMPREHENSIVE METABOLIC PANEL
ALT: 20 U/L (ref 0–44)
AST: 34 U/L (ref 15–41)
Albumin: 3.2 g/dL — ABNORMAL LOW (ref 3.5–5.0)
Alkaline Phosphatase: 71 U/L (ref 38–126)
Anion gap: 7 (ref 5–15)
BUN: 23 mg/dL (ref 8–23)
CO2: 20 mmol/L — ABNORMAL LOW (ref 22–32)
Calcium: 8.1 mg/dL — ABNORMAL LOW (ref 8.9–10.3)
Chloride: 98 mmol/L (ref 98–111)
Creatinine, Ser: 0.88 mg/dL (ref 0.44–1.00)
GFR, Estimated: >60 mL/min (ref >60)
Glucose, Bld: 127 mg/dL — ABNORMAL HIGH (ref 70–99)
Potassium: 4.3 mmol/L (ref 3.5–5.1)
Sodium: 125 mmol/L — ABNORMAL LOW (ref 135–145)
Total Bilirubin: 0.7 mg/dL (ref 0.3–1.2)
Total Protein: 5.9 g/dL — ABNORMAL LOW (ref 6.5–8.1)

## 2020-05-09 LAB — PROCALCITONIN: Procalcitonin: <0.1 ng/mL

## 2020-05-09 LAB — RESP PANEL BY RT-PCR (FLU A&B, COVID) ARPGX2
Influenza A by PCR: NEGATIVE
Influenza B by PCR: NEGATIVE
SARS Coronavirus 2 by RT PCR: POSITIVE — AB

## 2020-05-09 LAB — CBG MONITORING, ED: Glucose-Capillary: 128 mg/dL — ABNORMAL HIGH (ref 70–99)

## 2020-05-09 LAB — PROTIME-INR
INR: 1.5 — ABNORMAL HIGH (ref 0.8–1.2)
Prothrombin Time: 17.4 seconds — ABNORMAL HIGH (ref 11.4–15.2)

## 2020-05-09 LAB — LACTIC ACID, PLASMA
Lactic Acid, Venous: 1.5 mmol/L (ref 0.5–1.9)
Lactic Acid, Venous: 0.7 mmol/L (ref 0.5–1.9)

## 2020-05-09 LAB — C-REACTIVE PROTEIN: CRP: 1.9 mg/dL — ABNORMAL HIGH (ref <1.0)

## 2020-05-09 MED ORDER — IRBESARTAN 150 MG PO TABS: 300.0000 mg | ORAL_TABLET | Freq: Every day | ORAL | Status: DC

## 2020-05-09 MED ORDER — METOPROLOL TARTRATE 25 MG PO TABS
12.5000 mg | ORAL_TABLET | Freq: Two times a day (BID) | ORAL | Status: DC
  Administered 2020-05-10 – 2020-05-12 (×6): 12.5 mg via ORAL
  Filled 2020-05-09 (×6): qty 1

## 2020-05-09 MED ORDER — SODIUM CHLORIDE 0.9 % IV SOLN: 100.0000 mg | Freq: Once | INTRAVENOUS | Status: DC

## 2020-05-09 MED ORDER — ONDANSETRON HCL 4 MG PO TABS: 4.0000 mg | ORAL_TABLET | Freq: Four times a day (QID) | ORAL | Status: DC | PRN

## 2020-05-09 MED ORDER — SODIUM CHLORIDE 0.9 % IV SOLN: INTRAVENOUS | Status: DC

## 2020-05-09 MED ORDER — HYDROCOD POLST-CPM POLST ER 10-8 MG/5ML PO SUER
5.0000 mL | Freq: Two times a day (BID) | ORAL | Status: DC | PRN
  Administered 2020-05-10 – 2020-05-12 (×3): 5 mL via ORAL
  Filled 2020-05-09 (×3): qty 5

## 2020-05-09 MED ORDER — ZINC SULFATE 220 (50 ZN) MG PO CAPS
220.0000 mg | ORAL_CAPSULE | Freq: Every day | ORAL | Status: DC
  Administered 2020-05-10 – 2020-05-12 (×3): 220 mg via ORAL
  Filled 2020-05-09 (×4): qty 1

## 2020-05-09 MED ORDER — SODIUM CHLORIDE 0.9 % IV BOLUS (SEPSIS)
500.0000 mL | Freq: Once | INTRAVENOUS | Status: AC
  Administered 2020-05-09: 500 mL via INTRAVENOUS

## 2020-05-09 MED ORDER — GUAIFENESIN-DM 100-10 MG/5ML PO SYRP
10.0000 mL | ORAL_SOLUTION | ORAL | Status: DC | PRN
  Administered 2020-05-11 – 2020-05-12 (×3): 10 mL via ORAL
  Filled 2020-05-09 (×4): qty 10

## 2020-05-09 MED ORDER — LOPERAMIDE HCL 2 MG PO CAPS: 2.0000 mg | ORAL_CAPSULE | ORAL | Status: DC | PRN

## 2020-05-09 MED ORDER — APIXABAN 2.5 MG PO TABS
2.5000 mg | ORAL_TABLET | Freq: Two times a day (BID) | ORAL | Status: DC
  Administered 2020-05-10 – 2020-05-12 (×5): 2.5 mg via ORAL
  Filled 2020-05-09 (×6): qty 1

## 2020-05-09 MED ORDER — DEXAMETHASONE 4 MG PO TABS
6.0000 mg | ORAL_TABLET | ORAL | Status: DC
  Administered 2020-05-10: 6 mg via ORAL
  Filled 2020-05-09: qty 2

## 2020-05-09 MED ORDER — ALBUTEROL SULFATE HFA 108 (90 BASE) MCG/ACT IN AERS
2.0000 | INHALATION_SPRAY | Freq: Four times a day (QID) | RESPIRATORY_TRACT | Status: DC
  Administered 2020-05-10 (×2): 2 via RESPIRATORY_TRACT
  Filled 2020-05-09: qty 6.7

## 2020-05-09 MED ORDER — FLUTICASONE PROPIONATE 50 MCG/ACT NA SUSP
1.0000 | Freq: Two times a day (BID) | NASAL | Status: DC
  Administered 2020-05-10 – 2020-05-12 (×6): 1 via NASAL
  Filled 2020-05-09 (×2): qty 16

## 2020-05-09 MED ORDER — ASCORBIC ACID 500 MG PO TABS
500.0000 mg | ORAL_TABLET | Freq: Every day | ORAL | Status: DC
  Administered 2020-05-10 – 2020-05-12 (×3): 500 mg via ORAL
  Filled 2020-05-09 (×3): qty 1

## 2020-05-09 MED ORDER — SODIUM CHLORIDE 0.9 % IV SOLN: 100.0000 mg | Freq: Every day | INTRAVENOUS | Status: DC

## 2020-05-09 MED ORDER — ZOLPIDEM TARTRATE 5 MG PO TABS: 5.0000 mg | ORAL_TABLET | Freq: Every evening | ORAL | Status: DC | PRN

## 2020-05-09 MED ORDER — SODIUM CHLORIDE 0.9 % IV BOLUS
1000.0000 mL | Freq: Once | INTRAVENOUS | Status: AC
  Administered 2020-05-09: 1000 mL via INTRAVENOUS

## 2020-05-09 MED ORDER — ONDANSETRON HCL 4 MG/2ML IJ SOLN: 4.0000 mg | Freq: Four times a day (QID) | INTRAMUSCULAR | Status: DC | PRN

## 2020-05-09 MED ORDER — GUAIFENESIN 100 MG/5ML PO SOLN
10.0000 mL | Freq: Once | ORAL | Status: AC
  Administered 2020-05-09: 200 mg via ORAL
  Filled 2020-05-09: qty 5

## 2020-05-09 MED ORDER — ACETAMINOPHEN 325 MG PO TABS
650.0000 mg | ORAL_TABLET | Freq: Once | ORAL | Status: AC
  Administered 2020-05-09: 650 mg via ORAL
  Filled 2020-05-09: qty 2

## 2020-05-09 MED ORDER — APIXABAN 5 MG PO TABS: 5.0000 mg | ORAL_TABLET | Freq: Two times a day (BID) | ORAL | Status: DC

## 2020-05-09 MED ORDER — ATORVASTATIN CALCIUM 20 MG PO TABS
20.0000 mg | ORAL_TABLET | Freq: Every day | ORAL | Status: DC
Start: 2020-05-10 — End: 2020-05-13
  Administered 2020-05-11 – 2020-05-12 (×2): 20 mg via ORAL
  Filled 2020-05-09 (×3): qty 1

## 2020-05-09 MED ORDER — PANTOPRAZOLE SODIUM 40 MG PO TBEC
40.0000 mg | DELAYED_RELEASE_TABLET | Freq: Every day | ORAL | Status: DC | PRN
Start: 2020-05-09 — End: 2020-05-13
  Administered 2020-05-11: 40 mg via ORAL
  Filled 2020-05-09: qty 1

## 2020-05-09 NOTE — ED Provider Notes (Addendum)
[facility] EMERGENCY DEPARTMENT Provider Note   CSN: [id number] Arrival date & time: 05/09/20  1150     History Chief Complaint  Patient presents with  . Loss of Consciousness    Kristen Mckenzie is a 80 y.o. female.  HPI   Patient is an 80 year old female with a history of atrial fibrillation, NSTEMI, A. fib with RVR, who presents emergency department due to a near syncopal episode.  Patient was brought into the emergency department via EMS.  Patient states that she has been experiencing a cough for the past few weeks.  She has not vaccinated for COVID-19.  Multiple sick contacts that are COVID positive in her home.  She states that she stood up and was walking to the kitchen after taking her medications and began to feel faint.  She was able to lower herself down to the floor where she had a brief syncopal episode.  Per nursing note, patient states it lasted about 1 minute.  Denies falling to the floor.  No trauma.  When EMS arrived patient was found to be in SVT with a heart rate of 206 bpm.  She was given 6 mg of IV adenosine and converted to normal sinus rhythm with a rate of about 100.  She is still in normal sinus rhythm with a rate of about 100.  Patient states that she has been experiencing a cough since just before Christmas.  Was started on steroids as well as antihistamines without relief.  She was recently started on doxycycline as well.  Denies any improvement and notes now experiencing persistent diarrhea.  She denies any chest pain or shortness of breath.  No abdominal pain.  No nausea or vomiting.  She is anticoagulated on Eliquis and states she is compliant.  Patient states she lives alone.     Past Medical History:  Diagnosis Date  . Anxiety   . Arthritis   . Cholecystolithiasis   . Collagen vascular disease ([place])   . Cystocele with prolapse   . Hypertension   . PAF (paroxysmal atrial fibrillation) ([place])   . Rectocele     Patient Active Problem List   Diagnosis  Date Noted  . Secondary hypercoagulable state ([place]) 10/18/2019  . Elevated troponin 09/28/2019  . Chest pain 09/28/2019  . Atrial fibrillation ([place]) 09/19/2019  . Non-ST elevation (NSTEMI) myocardial infarction ([place]) 02/07/2019  . Atrial fibrillation with RVR ([place]) 02/06/2019  . 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 Tachycardia 02/28/2017  . Essential hypertension 02/28/2017  . Acute hyperglycemia 02/28/2017  . 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 ANTERIOR AND POSTERIOR REPAIR N/A 02/21/2018   Procedure: ANTERIOR (CYSTOCELE) REPAIR;  Surgeon: Bjorn Loser, MD;  Location: [place];  Service: Urology;  Laterality: N/A;  . ATRIAL FIBRILLATION ABLATION N/A 09/19/2019   Procedure: ATRIAL FIBRILLATION ABLATION;  Surgeon: Thompson Grayer, MD;  Location: [place] [facility];  Service: Cardiovascular;  Laterality: N/A;  . CARDIOVERSION N/A 09/29/2019   Procedure: CARDIOVERSION;  Surgeon: Evans Lance, MD;  Location: [place];  Service: Cardiovascular;  Laterality: N/A;  . CYSTOSCOPY N/A 02/21/2018   Procedure: CYSTOSCOPY;  Surgeon: Bjorn Loser, MD;  Location: [facility];  Service: Urology;  Laterality: N/A;  . EYE SURGERY Bilateral 2011   cataract extraction   . LAPAROSCOPIC VAGINAL HYSTERECTOMY WITH SALPINGO OOPHORECTOMY Bilateral 02/21/2018   Procedure: LAPAROSCOPIC ASSISTED VAGINAL HYSTERECTOMY WITH SALPINGO OOPHORECTOMY;  Surgeon: Mercie Eon  Jori Moll, MD;  Location: [facility];  Service: Gynecology;  Laterality: Bilateral;  Dr. Matilde Sprang to follow  . LEFT HEART CATH AND CORONARY ANGIOGRAPHY N/A 02/07/2019   Procedure: LEFT HEART CATH AND CORONARY ANGIOGRAPHY;  Surgeon: Burnell Blanks, MD;  Location: [place] [facility];  Service: Cardiovascular;  Laterality: N/A;     OB History    Gravida  2   Para  2   Term  2   Preterm      AB       Living  2     SAB      IAB      Ectopic      Multiple      Live Births              Family History  Problem Relation Age of Onset  . Hypertension Father     Social History   Tobacco Use  . Smoking status: Never Smoker  . Smokeless tobacco: Never Used  Vaping Use  . Vaping Use: Never used  Substance Use Topics  . Alcohol use: No  . Drug use: No    Home Medications Prior to Admission medications   Medication Sig Start Date End Date Taking? Authorizing Provider  acetaminophen (TYLENOL) 325 MG tablet Take 2 tablets (650 mg total) by mouth every 4 (four) hours as needed for headache or mild pain. 09/20/19   Shirley Friar, PA-C  apixaban (ELIQUIS) 5 MG TABS tablet Take 1 tablet (5 mg total) by mouth 2 (two) times daily. 02/18/20   Satira Sark, MD  atorvastatin (LIPITOR) 40 MG tablet Take 0.5 tablets (20 mg total) by mouth daily. 09/29/19   Shelly Coss, MD  Calcium Carbonate-Vitamin D (CALCARB 600/D PO) Take 1 tablet by mouth daily.     [provider]  cholecalciferol (VITAMIN D) 1000 units tablet Take 1,000 Units daily by mouth.    [provider]  COD LIVER OIL PO Take 1 capsule by mouth every other day.     [provider]  doxycycline (VIBRA-TABS) 100 MG tablet Take 100 mg by mouth 2 (two) times daily. 05/05/20   [provider]  metoprolol tartrate (LOPRESSOR) 25 MG tablet Take 0.5 tablets (12.5 mg total) by mouth 2 (two) times daily. 10/17/19   Satira Sark, MD  Multiple Vitamin (MULTIVITAMIN WITH MINERALS) TABS tablet Take 1 tablet daily by mouth.    [provider]  pantoprazole (PROTONIX) 40 MG tablet Take 40 mg by mouth daily as needed (heartburn).    [provider]  Potassium 99 MG TABS Take 99 mg by mouth 3 (three) times a week.     [provider]  spironolactone (ALDACTONE) 25 MG tablet Take 1 tablet (25 mg total) by mouth daily. 03/06/20 06/04/20  Satira Sark, MD   valsartan (DIOVAN) 320 MG tablet Take 320 mg by mouth daily.    [provider]  vitamin A 10000 UNIT capsule Take 10,000 Units by mouth every other day.     [provider]  zinc gluconate 50 MG tablet Take 50 mg by mouth every other day.    [provider]  zolpidem (AMBIEN) 5 MG tablet Take 5 mg by mouth at bedtime as needed for sleep.     [provider]    Allergies    Phenobarbital, Tamiflu [oseltamivir phosphate], Depakote [valproic acid], and Lisinopril  Review of Systems   Review of Systems  All other systems reviewed  and are negative. Ten systems reviewed and are negative for acute change, except as noted in the HPI.    Physical Exam Updated Vital Signs BP 122/60   Pulse 69   Temp 99.4 F (37.4 C) (Oral)   Resp (!) 22   Ht 5\' 4"  (1.626 m)   Wt 54.4 kg   SpO2 98%   BMI 20.60 kg/m   Physical Exam Vitals and nursing note reviewed.  Constitutional:      General: She is not in acute distress.    Appearance: Normal appearance. She is not ill-appearing, toxic-appearing or diaphoretic.  HENT:     Head: Normocephalic and atraumatic.     Right Ear: External ear normal.     Left Ear: External ear normal.     Nose: Nose normal.     Mouth/Throat:     Mouth: Mucous membranes are moist.     Pharynx: Oropharynx is clear. No oropharyngeal exudate or posterior oropharyngeal erythema.  Eyes:     General: No scleral icterus.       Right eye: No discharge.        Left eye: No discharge.     Extraocular Movements: Extraocular movements intact.     Conjunctiva/sclera: Conjunctivae normal.  Cardiovascular:     Rate and Rhythm: Regular rhythm. Tachycardia present.     Pulses: Normal pulses.     Heart sounds: Normal heart sounds. No murmur heard. No friction rub. No gallop.   Pulmonary:     Effort: Pulmonary effort is normal. No respiratory distress.     Breath sounds: No stridor. Rales present. No wheezing or rhonchi.     Comments: Crackles  noted in the bilateral lung bases. Abdominal:     General: Abdomen is flat.     Palpations: Abdomen is soft.     Tenderness: There is no abdominal tenderness.  Musculoskeletal:        General: Normal range of motion.     Cervical back: Normal range of motion and neck supple. No tenderness.     Right lower leg: No edema.     Left lower leg: No edema.  Skin:    General: Skin is warm and dry.  Neurological:     General: No focal deficit present.     Mental Status: She is alert and oriented to person, place, and time.     Comments: Patient is oriented to person, place, and time. Patient phonates in clear, complete, and coherent sentences.  No gross deficits.  Moving all 4 extremities with ease.  Distal sensation intact in all four extremities.   Psychiatric:        Mood and Affect: Mood normal.        Behavior: Behavior normal.    ED Results / Procedures / Treatments   Labs (all labs ordered are listed, but only abnormal results are displayed) Labs Reviewed  RESP PANEL BY RT-PCR (FLU A&B, COVID) ARPGX2 - Abnormal; Notable for the following components:      Result Value   SARS Coronavirus 2 by RT PCR POSITIVE (*)    All other components within normal limits  COMPREHENSIVE METABOLIC PANEL - Abnormal; Notable for the following components:   Sodium 125 (*)    CO2 20 (*)    Glucose, Bld 127 (*)    Calcium 8.1 (*)    Total Protein 5.9 (*)    Albumin 3.2 (*)    All other components within normal limits  CBC WITH DIFFERENTIAL/PLATELET - Abnormal; Notable  for the following components:   RBC 3.59 (*)    Hemoglobin 11.6 (*)    HCT 33.4 (*)    Platelets 114 (*)    Lymphs Abs 0.3 (*)    All other components within normal limits  PROTIME-INR - Abnormal; Notable for the following components:   Prothrombin Time 17.4 (*)    INR 1.5 (*)    All other components within normal limits  CBG MONITORING, ED - Abnormal; Notable for the following components:   Glucose-Capillary 128 (*)    All  other components within normal limits  CULTURE, BLOOD (ROUTINE X 2)  CULTURE, BLOOD (ROUTINE X 2)  LACTIC ACID, PLASMA  LACTIC ACID, PLASMA  URINALYSIS, ROUTINE W REFLEX MICROSCOPIC   EKG EKG Interpretation  Date/Time:  Saturday May 09 2020 11:57:04 EST Ventricular Rate:  97 PR Interval:    QRS Duration: 80 QT Interval:  327 QTC Calculation: 416 R Axis:   39 Text Interpretation: Sinus rhythm Since last tracing rate faster Otherwise no significant change Confirmed by Daleen Bo [phone]) on 05/09/2020 12:02:47 PM  Radiology CT Head Wo Contrast  Result Date: 05/09/2020 CLINICAL DATA:  Generalized weakness 1 week with syncopal episode today. Denies head injury. EXAM: CT HEAD WITHOUT CONTRAST TECHNIQUE: Contiguous axial images were obtained from the base of the skull through the vertex without intravenous contrast. COMPARISON:  None. FINDINGS: Brain: Ventricles, cisterns and other CSF spaces are normal. There is no mass, mass effect, shift of midline structures or acute hemorrhage. Minimal left basal ganglia calcification. No evidence of acute infarction. Vascular: No hyperdense vessel or unexpected calcification. Skull: Normal. Negative for fracture or focal lesion. Sinuses/Orbits: No acute finding. Other: None. IMPRESSION: No acute findings. Electronically Signed   By: Marin Olp M.D.   On: 05/09/2020 13:13   DG Chest Port 1 View  Result Date: 05/09/2020 CLINICAL DATA:  Cough and weakness for 1 week, COVID exposure EXAM: PORTABLE CHEST 1 VIEW COMPARISON:  09/27/2019 chest radiograph. FINDINGS: Stable cardiomediastinal silhouette with normal heart size. No pneumothorax. No pleural effusion. Moderate patchy opacities in both lungs, right greater than left, most prominent in the mid to lower right lung. IMPRESSION: Moderate patchy opacities in both lungs, right greater than left, suspicious for COVID-19 pneumonia. Electronically Signed   By: Ilona Sorrel M.D.   On: 05/09/2020 12:28    Procedures Procedures (including critical care time)  Medications Ordered in ED Medications  acetaminophen (TYLENOL) tablet 650 mg (650 mg Oral Given 05/09/20 1346)  sodium chloride 0.9 % bolus 500 mL (0 mLs Intravenous Stopped 05/09/20 1630)  guaiFENesin (ROBITUSSIN) 100 MG/5ML solution 200 mg (200 mg Oral Given 05/09/20 1752)   ED Course  I have reviewed the triage vital signs and the nursing notes.  Pertinent labs & imaging results that were available during my care of the patient were reviewed by me and considered in my medical decision making (see chart for details).  Clinical Course as of 05/09/20 1826  Sat May 09, 2020  1242 DG Chest [place] 1 View IMPRESSION: Moderate patchy opacities in both lungs, right greater than left, suspicious for COVID-19 pneumonia. [LJ]  1317 CT Head Wo Contrast IMPRESSION: No acute findings. [LJ]  1329 Sodium(!): 125 Down from 136, 2 months ago. [LJ]  1824 SARS Coronavirus 2 by RT PCR(!): POSITIVE [LJ]    Clinical Course User Index [LJ] Rayna Sexton, PA-C   MDM Rules/Calculators/A&P  Patient is a 80 year old female with a complex medical history who presents to the emergency department due to a near syncopal episode.  Patient was ambulating through her kitchen and became lightheaded and lowered herself down to the floor.  No head trauma, per patient.  But she has anticoagulated on Eliquis.  Obtained a CT scan of the head which was negative.  When EMS arrived patient was found to be tachycardic around 200 bpm.  She does have a history of A. fib with RVR.  Patient was thought to be in SVT at this time and was given 6 mg of adenosine.  She converted to normal sinus rhythm and stayed in normal sinus rhythm since arrival to the emergency department.  Patient has been experiencing a cough for the past few weeks.  Initially was treated with steroids as well as antihistamines without relief.  She started doxycycline a few days  ago and has also had minimal relief but has now started experiencing significant diarrhea.  Chest x-ray shows moderate patchy opacities in both lungs, right greater than left, suspicious for COVID-19 pneumonia.  Patient does have crackles in both lung bases on my exam.  She was initially febrile at 102.6 F but given APAP which reduced her fever to 99.4 F.  Patient hyponatremic at 125, down from 136, 2 months ago.  Also appears to be mildly orthostatic with a BP of 106/56 lying and 87/56 standing.  Likely secondary to volume loss from diarrhea and poor p.o. intake.  Patient lives alone.  Given this as well as her significant medical history and multiple issues today, feel that admission is reasonable at this time.  We will discuss with the medicine team.  Final Clinical Impression(s) / ED Diagnoses Final diagnoses:  Near syncope  Hyponatremia  Orthostatic hypotension  SVT (supraventricular tachycardia) ([place])  COVID-19   Rx / DC Orders ED Discharge Orders    None       Rayna Sexton, PA-C 05/09/20 1829    Rayna Sexton, PA-C 05/09/20 1843    Daleen Bo, MD 05/10/20 [phone]

## 2020-05-09 NOTE — ED Notes (Signed)
Per family and pt. Pt is to not receive Remdesivir.

## 2020-05-09 NOTE — ED Triage Notes (Signed)
Patient brought via EMS from home. Alert and oriented. Airway patent. Patient had syncopal episode today lasting approx 1 minute. Per paramedic patient felt faint and laid on floor prior to syncopal episode. Patient showing SVT of 206 om monitor, given 6mg  of adenosine via IV. Patient converted into normal sinus with rate of 100. Patient currently taking doxycycline for bronchitis and has had diarrhea x3 days. Patient also has positive Covid family members. Patient checked for Covid prior to the antibiotics and was negative. Patient's temp 102.6. Denies any pain just reports generalized weakness.

## 2020-05-09 NOTE — H&P (Signed)
History and Physical  Kristen Mckenzie:[id number] DOB: 12/16/1940 DOA: 05/09/2020  Referring physician: Rayna Sexton, PA-C, EDP PCP: Caryl Bis, MD  Outpatient Specialists:  Patient Coming From: home  Chief Complaint: diarrhea, presyncope  HPI: Kristen Mckenzie is a 80 y.o. female with a history of atrial fibrillation on Eliquis, hypertension, STEMI.  Patient presents with cough over the past 3 weeks.  She been tested for COVID twice at the beginning of her cough, both of which were negative.  She does have positive COVID contacts.  Her physician started her on doxycycline for bronchitis.  Shortly afterwards, she started having diarrhea -multiple episodes a day.  Today, she was in her kitchen when she began to feel weak and feeling like she was going to pass out.  She slowly sat down on the floor.  EMS was called.  The patient was found to be in SVT.  She was given adenosine which converted her heart rate into sinus rhythm with a rate around 100.  She was brought to the hospital for evaluation.  Emergency Department Course: Patient test Positive for COVID.  X-ray consistent with COVID-pneumonia.  White count 4.3.  Lactic acid 1.5 and 0.7 on repeat.  Blood cultures obtained.  Head CT negative.  Patient initially had fever on admission  Review of Systems:   Pt denies any fevers, chills, nausea, vomiting, diarrhea, constipation, abdominal pain, shortness of breath, dyspnea on exertion, orthopnea, cough, wheezing, palpitations, headache, vision changes, lightheadedness, dizziness, melena, rectal bleeding.  Review of systems are otherwise negative  Past Medical History:  Diagnosis Date  . Anxiety   . Arthritis   . Cholecystolithiasis   . Collagen vascular disease ([facility])   . Cystocele with prolapse   . Hypertension   . PAF (paroxysmal atrial fibrillation) ([place])   . Rectocele    Past Surgical History:  Procedure Laterality Date  . ANTERIOR AND POSTERIOR REPAIR N/A 02/21/2018    Procedure: ANTERIOR (CYSTOCELE) REPAIR;  Surgeon: Bjorn Loser, MD;  Location: [facility];  Service: Urology;  Laterality: N/A;  . ATRIAL FIBRILLATION ABLATION N/A 09/19/2019   Procedure: ATRIAL FIBRILLATION ABLATION;  Surgeon: Thompson Grayer, MD;  Location: [facility];  Service: Cardiovascular;  Laterality: N/A;  . CARDIOVERSION N/A 09/29/2019   Procedure: CARDIOVERSION;  Surgeon: Evans Lance, MD;  Location: [facility];  Service: Cardiovascular;  Laterality: N/A;  . CYSTOSCOPY N/A 02/21/2018   Procedure: CYSTOSCOPY;  Surgeon: Bjorn Loser, MD;  Location: [facility];  Service: Urology;  Laterality: N/A;  . EYE SURGERY Bilateral 2011   cataract extraction   . LAPAROSCOPIC VAGINAL HYSTERECTOMY WITH SALPINGO OOPHORECTOMY Bilateral 02/21/2018   Procedure: LAPAROSCOPIC ASSISTED VAGINAL HYSTERECTOMY WITH SALPINGO OOPHORECTOMY;  Surgeon: Maisie Fus, MD;  Location: [facility];  Service: Gynecology;  Laterality: Bilateral;  Dr. Matilde Sprang to follow  . LEFT HEART CATH AND CORONARY ANGIOGRAPHY N/A 02/07/2019   Procedure: LEFT HEART CATH AND CORONARY ANGIOGRAPHY;  Surgeon: Burnell Blanks, MD;  Location: [facility];  Service: Cardiovascular;  Laterality: N/A;   Social History:  reports that she has never smoked. She has never used smokeless tobacco. She reports that she does not drink alcohol and does not use drugs. Patient lives at home  Allergies  Allergen Reactions  . Phenobarbital Hives and Swelling  . Tamiflu [Oseltamivir Phosphate] Swelling and Rash  . Depakote [Valproic Acid] Hives and Itching  . Lisinopril Swelling    PT can only take low dosage  Family History  Problem Relation Age of Onset  . Hypertension Father       Prior to Admission medications   Medication Sig Start Date End Date Taking? Authorizing Provider  acetaminophen (TYLENOL) 325 MG tablet Take 2 tablets (650 mg total) by mouth every 4 (four)  hours as needed for headache or mild pain. 09/20/19   Shirley Friar, PA-C  apixaban (ELIQUIS) 5 MG TABS tablet Take 1 tablet (5 mg total) by mouth 2 (two) times daily. 02/18/20   Satira Sark, MD  atorvastatin (LIPITOR) 40 MG tablet Take 0.5 tablets (20 mg total) by mouth daily. 09/29/19   Shelly Coss, MD  Calcium Carbonate-Vitamin D (CALCARB 600/D PO) Take 1 tablet by mouth daily.     [provider]  cholecalciferol (VITAMIN D) 1000 units tablet Take 1,000 Units daily by mouth.    [provider]  COD LIVER OIL PO Take 1 capsule by mouth every other day.     [provider]  doxycycline (VIBRA-TABS) 100 MG tablet Take 100 mg by mouth 2 (two) times daily. 05/05/20   [provider]  metoprolol tartrate (LOPRESSOR) 25 MG tablet Take 0.5 tablets (12.5 mg total) by mouth 2 (two) times daily. 10/17/19   Satira Sark, MD  Multiple Vitamin (MULTIVITAMIN WITH MINERALS) TABS tablet Take 1 tablet daily by mouth.    [provider]  pantoprazole (PROTONIX) 40 MG tablet Take 40 mg by mouth daily as needed (heartburn).    [provider]  Potassium 99 MG TABS Take 99 mg by mouth 3 (three) times a week.     [provider]  spironolactone (ALDACTONE) 25 MG tablet Take 1 tablet (25 mg total) by mouth daily. 03/06/20 06/04/20  Satira Sark, MD  valsartan (DIOVAN) 320 MG tablet Take 320 mg by mouth daily.    [provider]  vitamin A 10000 UNIT capsule Take 10,000 Units by mouth every other day.     [provider]  zinc gluconate 50 MG tablet Take 50 mg by mouth every other day.    [provider]  zolpidem (AMBIEN) 5 MG tablet Take 5 mg by mouth at bedtime as needed for sleep.     [provider]    Physical Exam: BP (!) 143/61   Pulse 81   Temp 99.4 F (37.4 C) (Oral)   Resp 15   Ht 5\' 4"  (1.626 m)   Wt 54.4 kg   SpO2 98%   BMI 20.60 kg/m   . General: Elderly female. Awake  and alert and oriented x3. No acute cardiopulmonary distress.  Marland Kitchen HEENT: Normocephalic atraumatic.  Right and left ears normal in appearance.  Pupils equal, round, reactive to light. Extraocular muscles are intact. Sclerae anicteric and noninjected.  Moist mucosal membranes. No mucosal lesions.  . Neck: Neck supple without lymphadenopathy. No carotid bruits. No masses palpated.  . Cardiovascular: Irregularly irregular rate with normal S1-S2 sounds. No murmurs, rubs, gallops auscultated. No JVD.  Marland Kitchen Respiratory: rales bilaterally. No accessory muscle use. . Abdomen: Soft, nontender, nondistended. Active bowel sounds. No masses or hepatosplenomegaly  . Skin: No rashes, lesions, or ulcerations.  Dry, warm to touch. 2+ dorsalis pedis and radial pulses. . Musculoskeletal: No calf or leg pain. All major joints not erythematous nontender.  No upper or lower joint deformation.  Good ROM.  No contractures  . Psychiatric: Intact judgment and insight. Pleasant and cooperative. . Neurologic: No focal neurological deficits. Strength is 5/5 and  symmetric in upper and lower extremities.  Cranial nerves II through XII are grossly intact.           Labs on Admission: I have personally reviewed following labs and imaging studies  CBC: Recent Labs  Lab 05/09/20 1240  WBC 4.3  NEUTROABS 3.7  HGB 11.6*  HCT 33.4*  MCV 93.0  PLT 99991111*   Basic Metabolic Panel: Recent Labs  Lab 05/09/20 1240  NA 125*  K 4.3  CL 98  CO2 20*  GLUCOSE 127*  BUN 23  CREATININE 0.88  CALCIUM 8.1*   GFR: Estimated Creatinine Clearance: 43.8 mL/min (by C-G formula based on SCr of 0.88 mg/dL). Liver Function Tests: Recent Labs  Lab 05/09/20 1240  AST 34  ALT 20  ALKPHOS 71  BILITOT 0.7  PROT 5.9*  ALBUMIN 3.2*   No results for input(s): LIPASE, AMYLASE in the last 168 hours. No results for input(s): AMMONIA in the last 168 hours. Coagulation Profile: Recent Labs  Lab 05/09/20 1240  INR 1.5*   Cardiac  Enzymes: No results for input(s): CKTOTAL, CKMB, CKMBINDEX, TROPONINI in the last 168 hours. BNP (last 3 results) No results for input(s): PROBNP in the last 8760 hours. HbA1C: No results for input(s): HGBA1C in the last 72 hours. CBG: Recent Labs  Lab 05/09/20 1203  GLUCAP 128*   Lipid Profile: No results for input(s): CHOL, HDL, LDLCALC, TRIG, CHOLHDL, LDLDIRECT in the last 72 hours. Thyroid Function Tests: No results for input(s): TSH, T4TOTAL, FREET4, T3FREE, THYROIDAB in the last 72 hours. Anemia Panel: No results for input(s): VITAMINB12, FOLATE, FERRITIN, TIBC, IRON, RETICCTPCT in the last 72 hours. Urine analysis: No results found for: COLORURINE, APPEARANCEUR, LABSPEC, PHURINE, GLUCOSEU, HGBUR, BILIRUBINUR, KETONESUR, PROTEINUR, UROBILINOGEN, NITRITE, LEUKOCYTESUR Sepsis Labs: @LABRCNTIP (procalcitonin:4,lacticidven:4) ) Recent Results (from the past 240 hour(s))  Culture, blood (Routine x 2)     Status: None (Preliminary result)   Collection Time: 05/09/20 12:51 PM   Specimen: BLOOD LEFT FOREARM  Result Value Ref Range Status   Specimen Description   Final    BLOOD LEFT FOREARM BOTTLES DRAWN AEROBIC AND ANAEROBIC   Special Requests   Final    Blood Culture adequate volume Performed at [facility], [address] [place], [place] [address]    Culture PENDING  Incomplete   Report Status PENDING  Incomplete  Culture, blood (Routine x 2)     Status: None (Preliminary result)   Collection Time: 05/09/20 12:51 PM   Specimen: BLOOD RIGHT WRIST  Result Value Ref Range Status   Specimen Description   Final    BLOOD RIGHT WRIST BOTTLES DRAWN AEROBIC AND ANAEROBIC   Special Requests   Final    Blood Culture adequate volume Performed at [facility], [address], [place], [place] [address]    Culture PENDING  Incomplete   Report Status PENDING  Incomplete  Resp Panel by RT-PCR (Flu A&B, Covid) Nasopharyngeal Swab     Status: Abnormal   Collection Time: 05/09/20  1:52  PM   Specimen: Nasopharyngeal Swab; Nasopharyngeal(NP) swabs in vial transport medium  Result Value Ref Range Status   SARS Coronavirus 2 by RT PCR POSITIVE (A) NEGATIVE Final    Comment: RESULT CALLED TO, READ BACK BY AND VERIFIED WITH: [place],K @ 1822 ON 05/09/20 BY JUW (NOTE) SARS-CoV-2 target nucleic acids are DETECTED.  The SARS-CoV-2 RNA is generally detectable in upper respiratory specimens during the acute phase of infection. Positive results are indicative of the presence of the identified virus,  but do not rule out bacterial infection or co-infection with other pathogens not detected by the test. Clinical correlation with patient history and other diagnostic information is necessary to determine patient infection status. The expected result is Negative.  Fact Sheet for Patients: EntrepreneurPulse.com.au  Fact Sheet for Healthcare Providers: IncredibleEmployment.be  This test is not yet approved or cleared by the [place] FDA and  has been authorized for detection and/or diagnosis of SARS-CoV-2 by FDA under an Emergency Use Authorization (EUA).  This EUA will remain in effect (meaning this test ca n be used) for the duration of  the COVID-19 declaration under Section 564(b)(1) of the Act, 21 U.S.C. section 360bbb-3(b)(1), unless the authorization is terminated or revoked sooner.     Influenza A by PCR NEGATIVE NEGATIVE Final   Influenza B by PCR NEGATIVE NEGATIVE Final    Comment: (NOTE) The Xpert Xpress SARS-CoV-2/FLU/RSV plus assay is intended as an aid in the diagnosis of influenza from Nasopharyngeal swab specimens and should not be used as a sole basis for treatment. Nasal washings and aspirates are unacceptable for Xpert Xpress SARS-CoV-2/FLU/RSV testing.  Fact Sheet for Patients: EntrepreneurPulse.com.au  Fact Sheet for Healthcare Providers: IncredibleEmployment.be  This test  is not yet approved or cleared by the [place] FDA and has been authorized for detection and/or diagnosis of SARS-CoV-2 by FDA under an Emergency Use Authorization (EUA). This EUA will remain in effect (meaning this test can be used) for the duration of the COVID-19 declaration under Section 564(b)(1) of the Act, 21 U.S.C. section 360bbb-3(b)(1), unless the authorization is terminated or revoked.  Performed at [facility], [address], [place], [place] [address]      Radiological Exams on Admission: CT Head Wo Contrast  Result Date: 05/09/2020 CLINICAL DATA:  Generalized weakness 1 week with syncopal episode today. Denies head injury. EXAM: CT HEAD WITHOUT CONTRAST TECHNIQUE: Contiguous axial images were obtained from the base of the skull through the vertex without intravenous contrast. COMPARISON:  None. FINDINGS: Brain: Ventricles, cisterns and other CSF spaces are normal. There is no mass, mass effect, shift of midline structures or acute hemorrhage. Minimal left basal ganglia calcification. No evidence of acute infarction. Vascular: No hyperdense vessel or unexpected calcification. Skull: Normal. Negative for fracture or focal lesion. Sinuses/Orbits: No acute finding. Other: None. IMPRESSION: No acute findings. Electronically Signed   By: Marin Olp M.D.   On: 05/09/2020 13:13   DG Chest Port 1 View  Result Date: 05/09/2020 CLINICAL DATA:  Cough and weakness for 1 week, COVID exposure EXAM: PORTABLE CHEST 1 VIEW COMPARISON:  09/27/2019 chest radiograph. FINDINGS: Stable cardiomediastinal silhouette with normal heart size. No pneumothorax. No pleural effusion. Moderate patchy opacities in both lungs, right greater than left, most prominent in the mid to lower right lung. IMPRESSION: Moderate patchy opacities in both lungs, right greater than left, suspicious for COVID-19 pneumonia. Electronically Signed   By: Ilona Sorrel M.D.   On: 05/09/2020 12:28    EKG: Independently  reviewed. Sinus rhythm. No acute ST changes.  Assessment/Plan: Principal Problem:   Acute hyponatremia Active Problems:   Essential hypertension   History of non-ST elevation myocardial infarction (NSTEMI)   Atrial fibrillation (HCC)   Pneumonia due to COVID-19 virus   Dehydration   Diarrhea    This patient was discussed with the ED physician, including pertinent vitals, physical exam findings, labs, and imaging.  We also discussed care given by the ED provider.  1. Acute hyponatremia due to dehydration a. Possibly  due to antibiotic use.  Also possibly secondary to COVID-19 b. Hypovolemic c. Will replace with IV fluids. d. Check BMP now and tomorrow morning 2. Diarrhea a. Possibly secondary to antibiotic use or COVID-19 b. Check C. difficile c. Loperamide due to the patient being symptomatic with hyponatremia 3. COVID-19 pneumonia a. Discussed the use of remdesivir with the patient.  Unfortunately, the patient's daughter has convinced her not to take the medication.  I advised the patient that this was the best way to treat the virus and that studies have shown great success in shortening the course of the illness, in particular because the patient is still febrile.  At this time, the patient still does not want to take the medication.  I advised the patient that should she change her mind, to let us know I be more than happy to give her the medication. b. We will give her steroids: Dexamethasone 6 mg daily c. Albuterol inhalers d. Zinc, vitamin C 4. Hypertension, history of non-ST elevation MI a. Continue home regimen 5. Paroxysmal atrial fibrillation a. Continue beta-blocker, Eliquis  DVT prophylaxis: Eliquis Consultants: None Code Status: Full code Family Communication:   Disposition Plan: Patient should be able to return home following stabilization of sodium level   Truett Mainland, DO

## 2020-05-09 NOTE — ED Notes (Signed)
ED TO INPATIENT HANDOFF REPORT  ED Nurse Name and Phone #:   S Name/Age/Gender Kristen Mckenzie 80 y.o. female Room/Bed: APA07/APA07  Code Status   Code Status: Prior  Home/SNF/Other Home Patient oriented to: self, place, time and situation Is this baseline? Yes   Triage Complete: Triage complete  Chief Complaint Acute hyponatremia [E87.1]  Triage Note Patient brought via EMS from home. Alert and oriented. Airway patent. Patient had syncopal episode today lasting approx 1 minute. Per paramedic patient felt faint and laid on floor prior to syncopal episode. Patient showing SVT of 206 om monitor, given 6mg  of adenosine via IV. Patient converted into normal sinus with rate of 100. Patient currently taking doxycycline for bronchitis and has had diarrhea x3 days. Patient also has positive Covid family members. Patient checked for Covid prior to the antibiotics and was negative. Patient's temp 102.6. Denies any pain just reports generalized weakness.     Allergies Allergies  Allergen Reactions  . Phenobarbital Hives and Swelling  . Tamiflu [Oseltamivir Phosphate] Swelling and Rash  . Depakote [Valproic Acid] Hives and Itching  . Lisinopril Swelling    PT can only take low dosage    Level of Care/Admitting Diagnosis ED Disposition    ED Disposition Condition [facility]: [facility] [[id number]]  Level of Care: Telemetry [5]  Covid Evaluation: Confirmed COVID Positive  Diagnosis: Acute hyponatremia [045409]  Admitting Physician: Truett Mainland [[id number]]  Attending Physician: Truett Mainland [[id number]]  Estimated length of stay: 3 - 4 days  Certification:: I certify this patient will need inpatient services for at least 2 midnights       B Medical/Surgery History Past Medical History:  Diagnosis Date  . Anxiety   . Arthritis   . Cholecystolithiasis   . Collagen vascular disease ([place])   . Cystocele with prolapse   . Hypertension   . PAF  (paroxysmal atrial fibrillation) ([place])   . Rectocele    Past Surgical History:  Procedure Laterality Date  . ANTERIOR AND POSTERIOR REPAIR N/A 02/21/2018   Procedure: ANTERIOR (CYSTOCELE) REPAIR;  Surgeon: Bjorn Loser, MD;  Location: [place];  Service: Urology;  Laterality: N/A;  . ATRIAL FIBRILLATION ABLATION N/A 09/19/2019   Procedure: ATRIAL FIBRILLATION ABLATION;  Surgeon: Thompson Grayer, MD;  Location: [facility];  Service: Cardiovascular;  Laterality: N/A;  . CARDIOVERSION N/A 09/29/2019   Procedure: CARDIOVERSION;  Surgeon: Evans Lance, MD;  Location: [facility];  Service: Cardiovascular;  Laterality: N/A;  . CYSTOSCOPY N/A 02/21/2018   Procedure: CYSTOSCOPY;  Surgeon: Bjorn Loser, MD;  Location: [facility];  Service: Urology;  Laterality: N/A;  . EYE SURGERY Bilateral 2011   cataract extraction   . LAPAROSCOPIC VAGINAL HYSTERECTOMY WITH SALPINGO OOPHORECTOMY Bilateral 02/21/2018   Procedure: LAPAROSCOPIC ASSISTED VAGINAL HYSTERECTOMY WITH SALPINGO OOPHORECTOMY;  Surgeon: Maisie Fus, MD;  Location: [facility];  Service: Gynecology;  Laterality: Bilateral;  Dr. Matilde Sprang to follow  . LEFT HEART CATH AND CORONARY ANGIOGRAPHY N/A 02/07/2019   Procedure: LEFT HEART CATH AND CORONARY ANGIOGRAPHY;  Surgeon: Burnell Blanks, MD;  Location: [place] [facility];  Service: Cardiovascular;  Laterality: N/A;     A IV Location/Drains/Wounds Patient Lines/Drains/Airways Status    Active Line/Drains/Airways    Name Placement date Placement time Site Days   Peripheral IV 05/09/20 Right Antecubital 05/09/20  --  Antecubital  less than 1   Peripheral IV 05/09/20 Left Forearm 05/09/20  1253  Forearm  less than 1   External Urinary Catheter 09/28/19  0719  --  224          Intake/Output Last 24 hours  Intake/Output Summary (Last 24 hours) at 05/09/2020 2110 Last data filed at 05/09/2020 1630 Gross per 24 hour   Intake 500 ml  Output --  Net 500 ml    Labs/Imaging Results for orders placed or performed during the hospital encounter of 05/09/20 (from the past 48 hour(s))  CBG monitoring, ED     Status: Abnormal   Collection Time: 05/09/20 12:03 PM  Result Value Ref Range   Glucose-Capillary 128 (H) 70 - 99 mg/dL    Comment: Glucose reference range applies only to samples taken after fasting for at least 8 hours.  Comprehensive metabolic panel     Status: Abnormal   Collection Time: 05/09/20 12:40 PM  Result Value Ref Range   Sodium 125 (L) 135 - 145 mmol/L   Potassium 4.3 3.5 - 5.1 mmol/L   Chloride 98 98 - 111 mmol/L   CO2 20 (L) 22 - 32 mmol/L   Glucose, Bld 127 (H) 70 - 99 mg/dL    Comment: Glucose reference range applies only to samples taken after fasting for at least 8 hours.   BUN 23 8 - 23 mg/dL   Creatinine, Ser 0.88 0.44 - 1.00 mg/dL   Calcium 8.1 (L) 8.9 - 10.3 mg/dL   Total Protein 5.9 (L) 6.5 - 8.1 g/dL   Albumin 3.2 (L) 3.5 - 5.0 g/dL   AST 34 15 - 41 U/L   ALT 20 0 - 44 U/L   Alkaline Phosphatase 71 38 - 126 U/L   Total Bilirubin 0.7 0.3 - 1.2 mg/dL   GFR, Estimated >60 >60 mL/min    Comment: (NOTE) Calculated using the CKD-EPI Creatinine Equation (2021)    Anion gap 7 5 - 15    Comment: Performed at [facility], [address], [place], [place] [address]  Lactic acid, plasma     Status: None   Collection Time: 05/09/20 12:40 PM  Result Value Ref Range   Lactic Acid, Venous 1.5 0.5 - 1.9 mmol/L    Comment: Performed at [facility], [address], [place],  [address]  CBC with Differential     Status: Abnormal   Collection Time: 05/09/20 12:40 PM  Result Value Ref Range   WBC 4.3 4.0 - 10.5 K/uL   RBC 3.59 (L) 3.87 - 5.11 MIL/uL   Hemoglobin 11.6 (L) 12.0 - 15.0 g/dL   HCT 33.4 (L) 36.0 - 46.0 %   MCV 93.0 80.0 - 100.0 fL   MCH 32.3 26.0 - 34.0 pg   MCHC 34.7 30.0 - 36.0 g/dL   RDW 12.5 11.5 - 15.5 %   Platelets 114 (L) 150 - 400 K/uL    Comment:  REPEATED TO VERIFY PLATELET COUNT CONFIRMED BY SMEAR SPECIMEN CHECKED FOR CLOTS    nRBC 0.0 0.0 - 0.2 %   Neutrophils Relative % 87 %   Neutro Abs 3.7 1.7 - 7.7 K/uL   Lymphocytes Relative 8 %   Lymphs Abs 0.3 (L) 0.7 - 4.0 K/uL   Monocytes Relative 5 %   Monocytes Absolute 0.2 0.1 - 1.0 K/uL   Eosinophils Relative 0 %   Eosinophils Absolute 0.0 0.0 - 0.5 K/uL   Basophils Relative 0 %   Basophils Absolute 0.0 0.0 - 0.1 K/uL   Immature Granulocytes 0 %   Abs Immature Granulocytes  0.00 0.00 - 0.07 K/uL    Comment: Performed at [facility], [address], [place], [place] [address]  Protime-INR     Status: Abnormal   Collection Time: 05/09/20 12:40 PM  Result Value Ref Range   Prothrombin Time 17.4 (H) 11.4 - 15.2 seconds   INR 1.5 (H) 0.8 - 1.2    Comment: (NOTE) INR goal varies based on device and disease states. Performed at [facility], [address] [place], [place] [address]   Culture, blood (Routine x 2)     Status: None (Preliminary result)   Collection Time: 05/09/20 12:51 PM   Specimen: BLOOD LEFT FOREARM  Result Value Ref Range   Specimen Description      BLOOD LEFT FOREARM BOTTLES DRAWN AEROBIC AND ANAEROBIC   Special Requests      Blood Culture adequate volume Performed at [facility], [address] [place], [place] [address]    Culture PENDING    Report Status PENDING   Culture, blood (Routine x 2)     Status: None (Preliminary result)   Collection Time: 05/09/20 12:51 PM   Specimen: BLOOD RIGHT WRIST  Result Value Ref Range   Specimen Description      BLOOD RIGHT WRIST BOTTLES DRAWN AEROBIC AND ANAEROBIC   Special Requests      Blood Culture adequate volume Performed at [facility], [address] [place], [place] [address]    Culture PENDING    Report Status PENDING   Resp Panel by RT-PCR (Flu A&B, Covid) Nasopharyngeal Swab     Status: Abnormal   Collection Time: 05/09/20  1:52 PM   Specimen: Nasopharyngeal Swab; Nasopharyngeal(NP) swabs in  vial transport medium  Result Value Ref Range   SARS Coronavirus 2 by RT PCR POSITIVE (A) NEGATIVE    Comment: RESULT CALLED TO, READ BACK BY AND VERIFIED WITH: [place],K @ 1822 ON 05/09/20 BY JUW (NOTE) SARS-CoV-2 target nucleic acids are DETECTED.  The SARS-CoV-2 RNA is generally detectable in upper respiratory specimens during the acute phase of infection. Positive results are indicative of the presence of the identified virus, but do not rule out bacterial infection or co-infection with other pathogens not detected by the test. Clinical correlation with patient history and other diagnostic information is necessary to determine patient infection status. The expected result is Negative.  Fact Sheet for Patients: EntrepreneurPulse.com.au  Fact Sheet for Healthcare Providers: IncredibleEmployment.be  This test is not yet approved or cleared by the [place] FDA and  has been authorized for detection and/or diagnosis of SARS-CoV-2 by FDA under an Emergency Use Authorization (EUA).  This EUA will remain in effect (meaning this test ca n be used) for the duration of  the COVID-19 declaration under Section 564(b)(1) of the Act, 21 U.S.C. section 360bbb-3(b)(1), unless the authorization is terminated or revoked sooner.     Influenza A by PCR NEGATIVE NEGATIVE   Influenza B by PCR NEGATIVE NEGATIVE    Comment: (NOTE) The Xpert Xpress SARS-CoV-2/FLU/RSV plus assay is intended as an aid in the diagnosis of influenza from Nasopharyngeal swab specimens and should not be used as a sole basis for treatment. Nasal washings and aspirates are unacceptable for Xpert Xpress SARS-CoV-2/FLU/RSV testing.  Fact Sheet for Patients: EntrepreneurPulse.com.au  Fact Sheet for Healthcare Providers: IncredibleEmployment.be  This test is not yet approved or cleared by the [place] FDA and has been authorized for  detection and/or diagnosis of SARS-CoV-2 by FDA under an Emergency Use Authorization (EUA). This EUA will remain  in effect (meaning this test can be used) for the duration of the COVID-19 declaration under Section 564(b)(1) of the Act, 21 U.S.C. section 360bbb-3(b)(1), unless the authorization is terminated or revoked.  Performed at [facility], [address] [place], [place] [address]   Lactic acid, plasma     Status: None   Collection Time: 05/09/20  3:45 PM  Result Value Ref Range   Lactic Acid, Venous 0.7 0.5 - 1.9 mmol/L    Comment: Performed at [facility], [address] [place], [place] [address]   CT Head Wo Contrast  Result Date: 05/09/2020 CLINICAL DATA:  Generalized weakness 1 week with syncopal episode today. Denies head injury. EXAM: CT HEAD WITHOUT CONTRAST TECHNIQUE: Contiguous axial images were obtained from the base of the skull through the vertex without intravenous contrast. COMPARISON:  None. FINDINGS: Brain: Ventricles, cisterns and other CSF spaces are normal. There is no mass, mass effect, shift of midline structures or acute hemorrhage. Minimal left basal ganglia calcification. No evidence of acute infarction. Vascular: No hyperdense vessel or unexpected calcification. Skull: Normal. Negative for fracture or focal lesion. Sinuses/Orbits: No acute finding. Other: None. IMPRESSION: No acute findings. Electronically Signed   By: Marin Olp M.D.   On: 05/09/2020 13:13   DG Chest Port 1 View  Result Date: 05/09/2020 CLINICAL DATA:  Cough and weakness for 1 week, COVID exposure EXAM: PORTABLE CHEST 1 VIEW COMPARISON:  09/27/2019 chest radiograph. FINDINGS: Stable cardiomediastinal silhouette with normal heart size. No pneumothorax. No pleural effusion. Moderate patchy opacities in both lungs, right greater than left, most prominent in the mid to lower right lung. IMPRESSION: Moderate patchy opacities in both lungs, right greater than left, suspicious for COVID-19  pneumonia. Electronically Signed   By: Ilona Sorrel M.D.   On: 05/09/2020 12:28    Pending Labs Unresulted Labs (From admission, onward)          Start     Ordered   05/09/20 2031  C Difficile Quick Screen w PCR reflex  (C Difficile quick screen w PCR reflex panel)  Once, for 24 hours,   STAT       References:    CDiff Information Tool   05/09/20 2030   05/09/20 2019  Procalcitonin - Baseline  ONCE - STAT,   STAT        05/09/20 2018   05/09/20 2019  C-reactive protein  ONCE - STAT,   STAT        05/09/20 2018   05/09/20 [id number]  Basic metabolic panel  Once,   STAT        05/09/20 1844   05/09/20 1207  Urinalysis, Routine w reflex microscopic  ONCE - STAT,   STAT        05/09/20 1207   Signed and Held  CBC with Differential/Platelet  Daily,   R      Signed and Held   Signed and Held  Comprehensive metabolic panel  Daily,   R      Signed and Held   Signed and Held  C-reactive protein  Daily,   R      Signed and Held   Signed and Held  D-dimer, quantitative (not at [facility])  Daily,   R      Signed and Held   Signed and Held  Magnesium  Daily,   R      Signed and Held   Signed and Held  Phosphorus  Daily,   R  Signed and Held          Vitals/Pain Today's Vitals   05/09/20 1900 05/09/20 1930 05/09/20 2000 05/09/20 2030  BP: 129/63 (!) 143/61 130/62 140/60  Pulse: 72 81 75 74  Resp: (!) 30 15 (!) 29 (!) 27  Temp:      TempSrc:      SpO2: 98% 98% 99% 97%  Weight:      Height:      PainSc:        Isolation Precautions Enteric precautions (UV disinfection)  Medications Medications  sodium chloride 0.9 % bolus 1,000 mL (1,000 mLs Intravenous New Bag/Given 05/09/20 1958)    Followed by  0.9 %  sodium chloride infusion (has no administration in time range)  acetaminophen (TYLENOL) tablet 650 mg (650 mg Oral Given 05/09/20 1346)  sodium chloride 0.9 % bolus 500 mL (0 mLs Intravenous Stopped 05/09/20 1630)  guaiFENesin (ROBITUSSIN) 100 MG/5ML solution 200 mg (200 mg Oral  Given 05/09/20 1752)    Mobility walks High fall risk   Focused Assessments    R Recommendations: See Admitting Provider Note  Report given to:   Additional Notes:

## 2020-05-09 NOTE — ED Provider Notes (Signed)
  Face-to-face evaluation   History: She presents for evaluation of cough for several weeks, despite treatment, by her PCP x2 including antibiotics and prednisone.  She recalls having a fever for about a week.  She has not been immunized against COVID.  She did not take a flu shot this year.  Physical exam: Alert, nontoxic.  No respiratory distress.  Normal pulse right radius.  Medical screening examination/treatment/procedure(s) were conducted as a shared visit with non-physician practitioner(s) and myself.  I personally evaluated the patient during the encounter    Daleen Bo, MD 05/10/20 [phone]

## 2020-05-09 NOTE — ED Notes (Signed)
Patient transported to CT 

## 2020-05-10 DIAGNOSIS — E871 Hypo-osmolality and hyponatremia: Secondary | ICD-10-CM | POA: Diagnosis not present

## 2020-05-10 DIAGNOSIS — J1282 Pneumonia due to coronavirus disease 2019: Secondary | ICD-10-CM

## 2020-05-10 DIAGNOSIS — I951 Orthostatic hypotension: Secondary | ICD-10-CM

## 2020-05-10 DIAGNOSIS — U071 COVID-19: Principal | ICD-10-CM

## 2020-05-10 DIAGNOSIS — I10 Essential (primary) hypertension: Secondary | ICD-10-CM

## 2020-05-10 DIAGNOSIS — I480 Paroxysmal atrial fibrillation: Secondary | ICD-10-CM | POA: Diagnosis not present

## 2020-05-10 LAB — CBC WITH DIFFERENTIAL/PLATELET
Abs Immature Granulocytes: 0 10*3/uL (ref 0.00–0.07)
Basophils Absolute: 0 10*3/uL (ref 0.0–0.1)
Basophils Relative: 0 %
Eosinophils Absolute: 0 10*3/uL (ref 0.0–0.5)
Eosinophils Relative: 0 %
HCT: 31.3 % — ABNORMAL LOW (ref 36.0–46.0)
Hemoglobin: 10.8 g/dL — ABNORMAL LOW (ref 12.0–15.0)
Immature Granulocytes: 0 %
Lymphocytes Relative: 21 %
Lymphs Abs: 0.4 10*3/uL — ABNORMAL LOW (ref 0.7–4.0)
MCH: 32.1 pg (ref 26.0–34.0)
MCHC: 34.5 g/dL (ref 30.0–36.0)
MCV: 93.2 fL (ref 80.0–100.0)
Monocytes Absolute: 0.1 10*3/uL (ref 0.1–1.0)
Monocytes Relative: 5 %
Neutro Abs: 1.4 10*3/uL — ABNORMAL LOW (ref 1.7–7.7)
Neutrophils Relative %: 74 %
Platelets: 105 10*3/uL — ABNORMAL LOW (ref 150–400)
RBC: 3.36 MIL/uL — ABNORMAL LOW (ref 3.87–5.11)
RDW: 12.4 % (ref 11.5–15.5)
WBC: 1.9 10*3/uL — ABNORMAL LOW (ref 4.0–10.5)
nRBC: 0 % (ref 0.0–0.2)

## 2020-05-10 LAB — C DIFFICILE QUICK SCREEN W PCR REFLEX
C Diff antigen: NEGATIVE
C Diff interpretation: NOT DETECTED
C Diff toxin: NEGATIVE

## 2020-05-10 LAB — PHOSPHORUS: Phosphorus: 2.9 mg/dL (ref 2.5–4.6)

## 2020-05-10 LAB — COMPREHENSIVE METABOLIC PANEL
ALT: 19 U/L (ref 0–44)
AST: 33 U/L (ref 15–41)
Albumin: 2.8 g/dL — ABNORMAL LOW (ref 3.5–5.0)
Alkaline Phosphatase: 59 U/L (ref 38–126)
Anion gap: 8 (ref 5–15)
BUN: 15 mg/dL (ref 8–23)
CO2: 18 mmol/L — ABNORMAL LOW (ref 22–32)
Calcium: 8 mg/dL — ABNORMAL LOW (ref 8.9–10.3)
Chloride: 103 mmol/L (ref 98–111)
Creatinine, Ser: 0.53 mg/dL (ref 0.44–1.00)
GFR, Estimated: >60 mL/min (ref >60)
Glucose, Bld: 138 mg/dL — ABNORMAL HIGH (ref 70–99)
Potassium: 4.2 mmol/L (ref 3.5–5.1)
Sodium: 129 mmol/L — ABNORMAL LOW (ref 135–145)
Total Bilirubin: 0.8 mg/dL (ref 0.3–1.2)
Total Protein: 5.3 g/dL — ABNORMAL LOW (ref 6.5–8.1)

## 2020-05-10 LAB — D-DIMER, QUANTITATIVE: D-Dimer, Quant: 0.69 ug/mL-FEU — ABNORMAL HIGH (ref 0.00–0.50)

## 2020-05-10 LAB — MAGNESIUM: Magnesium: 1.6 mg/dL — ABNORMAL LOW (ref 1.7–2.4)

## 2020-05-10 LAB — C-REACTIVE PROTEIN: CRP: 2.7 mg/dL — ABNORMAL HIGH (ref <1.0)

## 2020-05-10 MED ORDER — SODIUM CHLORIDE 0.9 % IV SOLN: INTRAVENOUS | Status: DC

## 2020-05-10 MED ORDER — MAGNESIUM SULFATE 2 GM/50ML IV SOLN
2.0000 g | Freq: Once | INTRAVENOUS | Status: AC
  Administered 2020-05-10: 2 g via INTRAVENOUS
  Filled 2020-05-10: qty 50

## 2020-05-10 MED ORDER — ALBUTEROL SULFATE HFA 108 (90 BASE) MCG/ACT IN AERS
2.0000 | INHALATION_SPRAY | Freq: Three times a day (TID) | RESPIRATORY_TRACT | Status: DC
  Administered 2020-05-10 – 2020-05-12 (×6): 2 via RESPIRATORY_TRACT

## 2020-05-10 MED ORDER — CALCIUM CARBONATE ANTACID 500 MG PO CHEW
1.0000 | CHEWABLE_TABLET | Freq: Every day | ORAL | Status: DC | PRN
  Administered 2020-05-10: 200 mg via ORAL
  Filled 2020-05-10: qty 1

## 2020-05-10 MED ORDER — ALBUTEROL SULFATE HFA 108 (90 BASE) MCG/ACT IN AERS
2.0000 | INHALATION_SPRAY | RESPIRATORY_TRACT | Status: DC | PRN
  Administered 2020-05-11: 2 via RESPIRATORY_TRACT

## 2020-05-10 NOTE — Plan of Care (Signed)

## 2020-05-10 NOTE — Progress Notes (Signed)
PROGRESS NOTE  Kristen Mckenzie [id number] DOB: 03/28/1941 DOA: 05/09/2020 PCP: Caryl Bis, MD  Brief History:  80 year old female with a history of atrial fibrillation, coronary artery disease with history of NSTEMI, anxiety, hypertension presenting with a near syncopal episode and loose stools.  The patient states that she was getting up from the couch to go to the kitchen when she felt dizzy to the point where she had to lower herself to the ground and sit on the ground for a few moments.  She denied any frank loss of consciousness.  She denied any aura, chest discomfort, shortness of breath, palpitations.  EMS was activated, and the patient was reportedly in SVT with heart rate 200s.  She was given adenosine with conversion to normal sinus rhythm with heart rate 100.  In the emergency department, the patient remained in sinus rhythm. Notably, the patient states that she has been exposed to her daughter who is COVID-positive.  She states that she began having coughing, chest congestion, nasal congestion and intermittent rhinorrhea on 04/21/2020.  She went to see her PCP and was placed on some type of antibiotic for her bronchitis.  She went back to her PCP on 04/24/2020 and underwent a COVID test at her PCPs office.  It was reportedly negative.  Nevertheless, she was given prednisone and continued on her antibiotics.  Over the next 2 weeks, the patient continued to have coughing and chest congestion.  She went back to see her PCP on 05/06/2019 and was placed on doxycycline.  She stated that after taking the doxycycline she began having loose stools up to 3-4 bowel movements on a daily basis without any hematochezia or melena.  She started taking the doxycycline on the morning of 05/09/2019.  There is no hematochezia, melena, abdominal pain.  She denies any hemoptysis, shortness of breath, headache, sore throat.  She has had some intermittent chest pain and upper abdominal pain when she  coughs.  She has had some fevers and chills, but states that her temperature was never above 100.0 F. In the emergency department, the patient was febrile up to 1 to 2.6 F with soft blood pressures.  Orthostatics were positive.  Oxygen saturation 97-90% on room air.  BMP showed a sodium of 125 with serum creatinine 0.88.  LFTs were unremarkable.  WBC 4.3, hemoglobin 11.6, platelets 114,000.  Chest x-ray showed bilateral patchy infiltrates, right greater than left.  Assessment/Plan: COVID-19 pneumonia -The patient has been fairly asymptomatic except for her cough -personally reviewed CXR--patchy bilateral infiltrates R>L -Stable on room air -CRP 1.9 -Ferritin -D-dimer -PCT <0.10 -Lactic acid 0.7>> 1.5 -Given the patient's history and duration of symptoms, it appears that she is outside the window of benefit for remdesivir (she refuses it anyway)  Orthostatic hypotension/near syncope -Secondary to volume depletion -Orthostatic vital signs positive in the ED -Continue IV fluids  Diarrhea -May be a combination of her doxycycline versus COVID-19 enteritis -Check C. Difficile -Stool pathogen panel  Paroxysmal atrial fibrillation -Currently in sinus rhythm -Continue metoprolol tartrate -Continue apixaban  Hyperlipidemia -Continue statin  Essential hypertension -Holding ARB secondary to soft blood pressure -Holding spironolactone  Coronary artery disease -No chest pain presently -Personally reviewed EKG--sinus rhythm, no ST-T wave changes -Continue apixaban  Hyponatremia -Secondary to volume depletion -Continue IV fluids      Status is: Inpatient  Remains inpatient appropriate because:IV treatments appropriate due to intensity of illness or inability to take PO  Dispo: The patient is from: Home              Anticipated d/c is to: Home              Anticipated d/c date is: 1 day              Patient currently is not medically stable to d/c.        Family  Communication:  no Family at bedside  Consultants:  none  Code Status:  FULL  DVT Prophylaxis:  apixaban   Procedures: As Listed in Progress Note Above  Antibiotics: None       Subjective: The patient continues to complain of a nonproductive cough.  She did not lose any nausea, vomiting, abdominal pain.  She has some chest discomfort and upper abdominal pain with coughing.  There is no hematochezia or melena.  There is no dysuria or hematuria.  Objective: Vitals:   05/09/20 2030 05/09/20 2245 05/10/20 0548 05/10/20 0732  BP: 140/60 (!) 155/73 (!) 104/53   Pulse: 74 81 78   Resp: (!) 27 18 18    Temp:  99.6 F (37.6 C) 99.3 F (37.4 C)   TempSrc:  Oral Oral   SpO2: 97% 98% 97% 96%  Weight:      Height:        Intake/Output Summary (Last 24 hours) at 05/10/2020 0850 Last data filed at 05/09/2020 1630 Gross per 24 hour  Intake 500 ml  Output --  Net 500 ml   Weight change:  Exam:   General:  Pt is alert, follows commands appropriately, not in acute distress  HEENT: No icterus, No thrush, No neck mass, [place]/AT  Cardiovascular: RRR, S1/S2, no rubs, no gallops  Respiratory: bibasilar rales.  No wheezing.  Good air movement.  Abdomen: Soft/+BS, non tender, non distended, no guarding  Extremities: No edema, No lymphangitis, No petechiae, No rashes, no synovitis   Data Reviewed: I have personally reviewed following labs and imaging studies Basic Metabolic Panel: Recent Labs  Lab 05/09/20 1240 05/09/20 2000  NA 125* 127*  K 4.3 4.1  CL 98 101  CO2 20* 20*  GLUCOSE 127* 98  BUN 23 21  CREATININE 0.88 0.67  CALCIUM 8.1* 7.8*   Liver Function Tests: Recent Labs  Lab 05/09/20 1240  AST 34  ALT 20  ALKPHOS 71  BILITOT 0.7  PROT 5.9*  ALBUMIN 3.2*   No results for input(s): LIPASE, AMYLASE in the last 168 hours. No results for input(s): AMMONIA in the last 168 hours. Coagulation Profile: Recent Labs  Lab 05/09/20 1240  INR 1.5*    CBC: Recent Labs  Lab 05/09/20 1240  WBC 4.3  NEUTROABS 3.7  HGB 11.6*  HCT 33.4*  MCV 93.0  PLT 114*   Cardiac Enzymes: No results for input(s): CKTOTAL, CKMB, CKMBINDEX, TROPONINI in the last 168 hours. BNP: Invalid input(s): POCBNP CBG: Recent Labs  Lab 05/09/20 1203  GLUCAP 128*   HbA1C: No results for input(s): HGBA1C in the last 72 hours. Urine analysis: No results found for: COLORURINE, APPEARANCEUR, LABSPEC, PHURINE, GLUCOSEU, HGBUR, BILIRUBINUR, KETONESUR, PROTEINUR, UROBILINOGEN, NITRITE, LEUKOCYTESUR Sepsis Labs: @LABRCNTIP (procalcitonin:4,lacticidven:4) ) Recent Results (from the past 240 hour(s))  Culture, blood (Routine x 2)     Status: None (Preliminary result)   Collection Time: 05/09/20 12:51 PM   Specimen: BLOOD LEFT FOREARM  Result Value Ref Range Status   Specimen Description   Final    BLOOD LEFT FOREARM BOTTLES DRAWN AEROBIC AND ANAEROBIC  Special Requests   Final    Blood Culture adequate volume Performed at [facility], [address], [place], [place] [address]    Culture PENDING  Incomplete   Report Status PENDING  Incomplete  Culture, blood (Routine x 2)     Status: None (Preliminary result)   Collection Time: 05/09/20 12:51 PM   Specimen: BLOOD RIGHT WRIST  Result Value Ref Range Status   Specimen Description   Final    BLOOD RIGHT WRIST BOTTLES DRAWN AEROBIC AND ANAEROBIC   Special Requests   Final    Blood Culture adequate volume Performed at [facility], [address], [place], [place] [address]    Culture PENDING  Incomplete   Report Status PENDING  Incomplete  Resp Panel by RT-PCR (Flu A&B, Covid) Nasopharyngeal Swab     Status: Abnormal   Collection Time: 05/09/20  1:52 PM   Specimen: Nasopharyngeal Swab; Nasopharyngeal(NP) swabs in vial transport medium  Result Value Ref Range Status   SARS Coronavirus 2 by RT PCR POSITIVE (A) NEGATIVE Final    Comment: RESULT CALLED TO, READ BACK BY AND VERIFIED WITH: [place],K @  1822 ON 05/09/20 BY JUW (NOTE) SARS-CoV-2 target nucleic acids are DETECTED.  The SARS-CoV-2 RNA is generally detectable in upper respiratory specimens during the acute phase of infection. Positive results are indicative of the presence of the identified virus, but do not rule out bacterial infection or co-infection with other pathogens not detected by the test. Clinical correlation with patient history and other diagnostic information is necessary to determine patient infection status. The expected result is Negative.  Fact Sheet for Patients: EntrepreneurPulse.com.au  Fact Sheet for Healthcare Providers: IncredibleEmployment.be  This test is not yet approved or cleared by the [place] FDA and  has been authorized for detection and/or diagnosis of SARS-CoV-2 by FDA under an Emergency Use Authorization (EUA).  This EUA will remain in effect (meaning this test ca n be used) for the duration of  the COVID-19 declaration under Section 564(b)(1) of the Act, 21 U.S.C. section 360bbb-3(b)(1), unless the authorization is terminated or revoked sooner.     Influenza A by PCR NEGATIVE NEGATIVE Final   Influenza B by PCR NEGATIVE NEGATIVE Final    Comment: (NOTE) The Xpert Xpress SARS-CoV-2/FLU/RSV plus assay is intended as an aid in the diagnosis of influenza from Nasopharyngeal swab specimens and should not be used as a sole basis for treatment. Nasal washings and aspirates are unacceptable for Xpert Xpress SARS-CoV-2/FLU/RSV testing.  Fact Sheet for Patients: EntrepreneurPulse.com.au  Fact Sheet for Healthcare Providers: IncredibleEmployment.be  This test is not yet approved or cleared by the [place] FDA and has been authorized for detection and/or diagnosis of SARS-CoV-2 by FDA under an Emergency Use Authorization (EUA). This EUA will remain in effect (meaning this test can be used) for the duration  of the COVID-19 declaration under Section 564(b)(1) of the Act, 21 U.S.C. section 360bbb-3(b)(1), unless the authorization is terminated or revoked.  Performed at [facility], [address] [place],  [address]      Scheduled Meds: . albuterol  2 puff Inhalation TID  . apixaban  2.5 mg Oral BID  . vitamin C  500 mg Oral Daily  . atorvastatin  20 mg Oral Daily  . dexamethasone  6 mg Oral Q24H  . fluticasone  1 spray Each Nare BID  . metoprolol tartrate  12.5 mg Oral BID  . zinc sulfate  220 mg Oral Daily   Continuous Infusions: .  sodium chloride 100 mL/hr at 05/09/20 2310    Procedures/Studies: CT Head Wo Contrast  Result Date: 05/09/2020 CLINICAL DATA:  Generalized weakness 1 week with syncopal episode today. Denies head injury. EXAM: CT HEAD WITHOUT CONTRAST TECHNIQUE: Contiguous axial images were obtained from the base of the skull through the vertex without intravenous contrast. COMPARISON:  None. FINDINGS: Brain: Ventricles, cisterns and other CSF spaces are normal. There is no mass, mass effect, shift of midline structures or acute hemorrhage. Minimal left basal ganglia calcification. No evidence of acute infarction. Vascular: No hyperdense vessel or unexpected calcification. Skull: Normal. Negative for fracture or focal lesion. Sinuses/Orbits: No acute finding. Other: None. IMPRESSION: No acute findings. Electronically Signed   By: Marin Olp M.D.   On: 05/09/2020 13:13   DG Chest Port 1 View  Result Date: 05/09/2020 CLINICAL DATA:  Cough and weakness for 1 week, COVID exposure EXAM: PORTABLE CHEST 1 VIEW COMPARISON:  09/27/2019 chest radiograph. FINDINGS: Stable cardiomediastinal silhouette with normal heart size. No pneumothorax. No pleural effusion. Moderate patchy opacities in both lungs, right greater than left, most prominent in the mid to lower right lung. IMPRESSION: Moderate patchy opacities in both lungs, right greater than left, suspicious for COVID-19  pneumonia. Electronically Signed   By: Ilona Sorrel M.D.   On: 05/09/2020 12:28    Orson Eva, DO  [facility]  If 7PM-7AM, please contact night-coverage [email or web address] Password [email or web address] 05/10/2020, 8:50 AM   LOS: 1 day

## 2020-05-10 NOTE — Discharge Summary (Incomplete)
Physician Discharge Summary  Kristen Mckenzie [id number] DOB: Apr 20, 1941 DOA: 05/09/2020  PCP: Kristen Bis, MD  Admit date: 05/09/2020 Discharge date: 05/11/2020  Admitted From: Home Disposition:  Home / SNF  Recommendations for Outpatient Follow-up:  1. Follow up with PCP in 1-2 weeks 2. Please obtain BMP/CBC in one week     Discharge Condition: Stable CODE STATUS: FULL Diet recommendation: Heart Healthy / Carb Modified / Dysphagia / Regular   Brief/Interim Summary: 80 year old female with a history of atrial fibrillation, coronary artery disease with history of NSTEMI, anxiety, hypertension presenting with a near syncopal episode and loose stools.  The patient states that she was getting up from the couch to go to the kitchen when she felt dizzy to the point where she had to lower herself to the ground and sit on the ground for a few moments.  She denied any frank loss of consciousness.  She denied any aura, chest discomfort, shortness of breath, palpitations.  EMS was activated, and the patient was reportedly in SVT with heart rate 200s.  She was given adenosine with conversion to normal sinus rhythm with heart rate 100.  In the emergency department, the patient remained in sinus rhythm. Notably, the patient states that she has been exposed to her daughter who is COVID-positive.  She states that she began having coughing, chest congestion, nasal congestion and intermittent rhinorrhea on 04/21/2020.  She went to see her PCP and was placed on some type of antibiotic for her bronchitis.  She went back to her PCP on 04/24/2020 and underwent a COVID test at her PCPs office.  It was reportedly negative.  Nevertheless, she was given prednisone and continued on her antibiotics.  Over the next 2 weeks, the patient continued to have coughing and chest congestion.  She went back to see her PCP on 05/06/2019 and was placed on doxycycline.  She stated that after taking the doxycycline she began  having loose stools up to 3-4 bowel movements on a daily basis without any hematochezia or melena.  She started taking the doxycycline on the morning of 05/09/2019.  There is no hematochezia, melena, abdominal pain.  She denies any hemoptysis, shortness of breath, headache, sore throat.  She has had some intermittent chest pain and upper abdominal pain when she coughs.  She has had some fevers and chills, but states that her temperature was never above 100.0 F. In the emergency department, the patient was febrile up to 1 to 2.6 F with soft blood pressures.  Orthostatics were positive.  Oxygen saturation 97-90% on room air.  BMP showed a sodium of 125 with serum creatinine 0.88.  LFTs were unremarkable.  WBC 4.3, hemoglobin 11.6, platelets 114,000.  Chest x-ray showed bilateral patchy infiltrates, right greater than left.  Discharge Diagnoses:  COVID-19 pneumonia -The patient has been fairly asymptomatic except for her cough -personally reviewed CXR--patchy bilateral infiltrates R>L -Stable on room air -CRP 1.9>>2.7 -Ferritin -D-dimer 0.69 -PCT <0.10 -Lactic acid 0.7>> 1.5 -Given the patient's history and duration of symptoms, it appears that she is outside the window of benefit for remdesivir (she refuses it anyway)  Orthostatic hypotension/near syncope -Secondary to volume depletion -Orthostatic vital signs positive in the ED -Continue IV fluids  Diarrhea -May be a combination of her doxycycline versus COVID-19 enteritis -Check C. Difficile--neg -Stool pathogen panel  Paroxysmal atrial fibrillation -Currently in sinus rhythm -Continue metoprolol tartrate -Continue apixaban  Hyperlipidemia -Continue statin  Essential hypertension -Holding ARB secondary to soft blood pressure -Holding  spironolactone  Coronary artery disease -No chest pain presently -Personally reviewed EKG--sinus rhythm, no ST-T wave changes -Continue apixaban  Hyponatremia -Secondary to volume  depletion -Continue IV fluids-->improved  Thrombocytopenia -likely to to viral infection -B12 -folate -TSH  Hypomagnesemia -replete     Discharge Instructions   Allergies as of 05/10/2020      Reactions   Phenobarbital Hives, Swelling   Tamiflu [oseltamivir Phosphate] Swelling, Rash   Depakote [valproic Acid] Hives, Itching   Lisinopril Swelling   PT can only take low dosage    Med Rec must be completed prior to using this SMARTLINK***       Allergies  Allergen Reactions  . Phenobarbital Hives and Swelling  . Tamiflu [Oseltamivir Phosphate] Swelling and Rash  . Depakote [Valproic Acid] Hives and Itching  . Lisinopril Swelling    PT can only take low dosage    Consultations:  ***   Procedures/Studies: CT Head Wo Contrast  Result Date: 05/09/2020 CLINICAL DATA:  Generalized weakness 1 week with syncopal episode today. Denies head injury. EXAM: CT HEAD WITHOUT CONTRAST TECHNIQUE: Contiguous axial images were obtained from the base of the skull through the vertex without intravenous contrast. COMPARISON:  None. FINDINGS: Brain: Ventricles, cisterns and other CSF spaces are normal. There is no mass, mass effect, shift of midline structures or acute hemorrhage. Minimal left basal ganglia calcification. No evidence of acute infarction. Vascular: No hyperdense vessel or unexpected calcification. Skull: Normal. Negative for fracture or focal lesion. Sinuses/Orbits: No acute finding. Other: None. IMPRESSION: No acute findings. Electronically Signed   By: Marin Olp M.D.   On: 05/09/2020 13:13   DG Chest Port 1 View  Result Date: 05/09/2020 CLINICAL DATA:  Cough and weakness for 1 week, COVID exposure EXAM: PORTABLE CHEST 1 VIEW COMPARISON:  09/27/2019 chest radiograph. FINDINGS: Stable cardiomediastinal silhouette with normal heart size. No pneumothorax. No pleural effusion. Moderate patchy opacities in both lungs, right greater than left, most prominent in the mid to  lower right lung. IMPRESSION: Moderate patchy opacities in both lungs, right greater than left, suspicious for COVID-19 pneumonia. Electronically Signed   By: Ilona Sorrel M.D.   On: 05/09/2020 12:28         Discharge Exam: Vitals:   05/10/20 1247 05/10/20 1350  BP: (!) 110/51   Pulse: 70   Resp: (!) 22   Temp: 98.5 F (36.9 C)   SpO2: 98% 94%   Vitals:   05/10/20 0548 05/10/20 0732 05/10/20 1247 05/10/20 1350  BP: (!) 104/53  (!) 110/51   Pulse: 78  70   Resp: 18  (!) 22   Temp: 99.3 F (37.4 C)  98.5 F (36.9 C)   TempSrc: Oral  Oral   SpO2: 97% 96% 98% 94%  Weight:      Height:        General: Pt is alert, awake, not in acute distress Cardiovascular: RRR, S1/S2 +, no rubs, no gallops Respiratory: CTA bilaterally, no wheezing, no rhonchi Abdominal: Soft, NT, ND, bowel sounds + Extremities: no edema, no cyanosis   The results of significant diagnostics from this hospitalization (including imaging, microbiology, ancillary and laboratory) are listed below for reference.    Significant Diagnostic Studies: CT Head Wo Contrast  Result Date: 05/09/2020 CLINICAL DATA:  Generalized weakness 1 week with syncopal episode today. Denies head injury. EXAM: CT HEAD WITHOUT CONTRAST TECHNIQUE: Contiguous axial images were obtained from the base of the skull through the vertex without intravenous contrast. COMPARISON:  None. FINDINGS: Brain:  Ventricles, cisterns and other CSF spaces are normal. There is no mass, mass effect, shift of midline structures or acute hemorrhage. Minimal left basal ganglia calcification. No evidence of acute infarction. Vascular: No hyperdense vessel or unexpected calcification. Skull: Normal. Negative for fracture or focal lesion. Sinuses/Orbits: No acute finding. Other: None. IMPRESSION: No acute findings. Electronically Signed   By: Marin Olp M.D.   On: 05/09/2020 13:13   DG Chest Port 1 View  Result Date: 05/09/2020 CLINICAL DATA:  Cough and  weakness for 1 week, COVID exposure EXAM: PORTABLE CHEST 1 VIEW COMPARISON:  09/27/2019 chest radiograph. FINDINGS: Stable cardiomediastinal silhouette with normal heart size. No pneumothorax. No pleural effusion. Moderate patchy opacities in both lungs, right greater than left, most prominent in the mid to lower right lung. IMPRESSION: Moderate patchy opacities in both lungs, right greater than left, suspicious for COVID-19 pneumonia. Electronically Signed   By: Ilona Sorrel M.D.   On: 05/09/2020 12:28     Microbiology: Recent Results (from the past 240 hour(s))  Culture, blood (Routine x 2)     Status: None (Preliminary result)   Collection Time: 05/09/20 12:51 PM   Specimen: BLOOD LEFT FOREARM  Result Value Ref Range Status   Specimen Description   Final    BLOOD LEFT FOREARM BOTTLES DRAWN AEROBIC AND ANAEROBIC   Special Requests Blood Culture adequate volume  Final   Culture   Final    NO GROWTH < 24 HOURS Performed at [facility], [address] [place], [place] [address]    Report Status PENDING  Incomplete  Culture, blood (Routine x 2)     Status: None (Preliminary result)   Collection Time: 05/09/20 12:51 PM   Specimen: BLOOD RIGHT WRIST  Result Value Ref Range Status   Specimen Description   Final    BLOOD RIGHT WRIST BOTTLES DRAWN AEROBIC AND ANAEROBIC   Special Requests Blood Culture adequate volume  Final   Culture   Final    NO GROWTH < 24 HOURS Performed at [facility], [address] [place], [place] [address]    Report Status PENDING  Incomplete  Resp Panel by RT-PCR (Flu A&B, Covid) Nasopharyngeal Swab     Status: Abnormal   Collection Time: 05/09/20  1:52 PM   Specimen: Nasopharyngeal Swab; Nasopharyngeal(NP) swabs in vial transport medium  Result Value Ref Range Status   SARS Coronavirus 2 by RT PCR POSITIVE (A) NEGATIVE Final    Comment: RESULT CALLED TO, READ BACK BY AND VERIFIED WITH: [place],K @ 1822 ON 05/09/20 BY JUW (NOTE) SARS-CoV-2 target nucleic  acids are DETECTED.  The SARS-CoV-2 RNA is generally detectable in upper respiratory specimens during the acute phase of infection. Positive results are indicative of the presence of the identified virus, but do not rule out bacterial infection or co-infection with other pathogens not detected by the test. Clinical correlation with patient history and other diagnostic information is necessary to determine patient infection status. The expected result is Negative.  Fact Sheet for Patients: EntrepreneurPulse.com.au  Fact Sheet for Healthcare Providers: IncredibleEmployment.be  This test is not yet approved or cleared by the [place] FDA and  has been authorized for detection and/or diagnosis of SARS-CoV-2 by FDA under an Emergency Use Authorization (EUA).  This EUA will remain in effect (meaning this test ca n be used) for the duration of  the COVID-19 declaration under Section 564(b)(1) of the Act, 21 U.S.C. section 360bbb-3(b)(1), unless the authorization is terminated or revoked sooner.  Influenza A by PCR NEGATIVE NEGATIVE Final   Influenza B by PCR NEGATIVE NEGATIVE Final    Comment: (NOTE) The Xpert Xpress SARS-CoV-2/FLU/RSV plus assay is intended as an aid in the diagnosis of influenza from Nasopharyngeal swab specimens and should not be used as a sole basis for treatment. Nasal washings and aspirates are unacceptable for Xpert Xpress SARS-CoV-2/FLU/RSV testing.  Fact Sheet for Patients: EntrepreneurPulse.com.au  Fact Sheet for Healthcare Providers: IncredibleEmployment.be  This test is not yet approved or cleared by the [place] FDA and has been authorized for detection and/or diagnosis of SARS-CoV-2 by FDA under an Emergency Use Authorization (EUA). This EUA will remain in effect (meaning this test can be used) for the duration of the COVID-19 declaration under Section 564(b)(1) of  the Act, 21 U.S.C. section 360bbb-3(b)(1), unless the authorization is terminated or revoked.  Performed at [facility], [address] [place], [place] [address]   C Difficile Quick Screen w PCR reflex     Status: None   Collection Time: 05/10/20 10:56 AM   Specimen: STOOL  Result Value Ref Range Status   C Diff antigen NEGATIVE NEGATIVE Final   C Diff toxin NEGATIVE NEGATIVE Final   C Diff interpretation No C. difficile detected.  Final    Comment: Performed at [facility], [address], [place],  [address]     Labs: Basic Metabolic Panel: Recent Labs  Lab 05/09/20 1240 05/09/20 2000 05/10/20 0744  NA 125* 127* 129*  K 4.3 4.1 4.2  CL 98 101 103  CO2 20* 20* 18*  GLUCOSE 127* 98 138*  BUN 23 21 15   CREATININE 0.88 0.67 0.53  CALCIUM 8.1* 7.8* 8.0*  MG  --   --  1.6*  PHOS  --   --  2.9   Liver Function Tests: Recent Labs  Lab 05/09/20 1240 05/10/20 0744  AST 34 33  ALT 20 19  ALKPHOS 71 59  BILITOT 0.7 0.8  PROT 5.9* 5.3*  ALBUMIN 3.2* 2.8*   No results for input(s): LIPASE, AMYLASE in the last 168 hours. No results for input(s): AMMONIA in the last 168 hours. CBC: Recent Labs  Lab 05/09/20 1240 05/10/20 0744  WBC 4.3 1.9*  NEUTROABS 3.7 1.4*  HGB 11.6* 10.8*  HCT 33.4* 31.3*  MCV 93.0 93.2  PLT 114* 105*   Cardiac Enzymes: No results for input(s): CKTOTAL, CKMB, CKMBINDEX, TROPONINI in the last 168 hours. BNP: Invalid input(s): POCBNP CBG: Recent Labs  Lab 05/09/20 1203  GLUCAP 128*    Time coordinating discharge:  36 minutes  Signed:  Orson Eva, DO [facility] Pager: [phone] 05/10/2020, 4:33 PM

## 2020-05-11 DIAGNOSIS — R197 Diarrhea, unspecified: Secondary | ICD-10-CM

## 2020-05-11 DIAGNOSIS — I4891 Unspecified atrial fibrillation: Secondary | ICD-10-CM | POA: Diagnosis not present

## 2020-05-11 DIAGNOSIS — R55 Syncope and collapse: Secondary | ICD-10-CM | POA: Diagnosis not present

## 2020-05-11 DIAGNOSIS — U071 COVID-19: Secondary | ICD-10-CM | POA: Diagnosis not present

## 2020-05-11 LAB — COMPREHENSIVE METABOLIC PANEL
ALT: 18 U/L (ref 0–44)
AST: 37 U/L (ref 15–41)
Albumin: 2.8 g/dL — ABNORMAL LOW (ref 3.5–5.0)
Alkaline Phosphatase: 56 U/L (ref 38–126)
Anion gap: 6 (ref 5–15)
BUN: 16 mg/dL (ref 8–23)
CO2: 18 mmol/L — ABNORMAL LOW (ref 22–32)
Calcium: 7.5 mg/dL — ABNORMAL LOW (ref 8.9–10.3)
Chloride: 106 mmol/L (ref 98–111)
Creatinine, Ser: 0.57 mg/dL (ref 0.44–1.00)
GFR, Estimated: >60 mL/min (ref >60)
Glucose, Bld: 128 mg/dL — ABNORMAL HIGH (ref 70–99)
Potassium: 3.6 mmol/L (ref 3.5–5.1)
Sodium: 130 mmol/L — ABNORMAL LOW (ref 135–145)
Total Bilirubin: 0.6 mg/dL (ref 0.3–1.2)
Total Protein: 5 g/dL — ABNORMAL LOW (ref 6.5–8.1)

## 2020-05-11 LAB — CBC WITH DIFFERENTIAL/PLATELET
Abs Immature Granulocytes: 0.01 10*3/uL (ref 0.00–0.07)
Basophils Absolute: 0 10*3/uL (ref 0.0–0.1)
Basophils Relative: 0 %
Eosinophils Absolute: 0 10*3/uL (ref 0.0–0.5)
Eosinophils Relative: 0 %
HCT: 29.4 % — ABNORMAL LOW (ref 36.0–46.0)
Hemoglobin: 10.2 g/dL — ABNORMAL LOW (ref 12.0–15.0)
Immature Granulocytes: 0 %
Lymphocytes Relative: 7 %
Lymphs Abs: 0.4 10*3/uL — ABNORMAL LOW (ref 0.7–4.0)
MCH: 32.4 pg (ref 26.0–34.0)
MCHC: 34.7 g/dL (ref 30.0–36.0)
MCV: 93.3 fL (ref 80.0–100.0)
Monocytes Absolute: 0.2 10*3/uL (ref 0.1–1.0)
Monocytes Relative: 4 %
Neutro Abs: 4.5 10*3/uL (ref 1.7–7.7)
Neutrophils Relative %: 89 %
Platelets: 121 10*3/uL — ABNORMAL LOW (ref 150–400)
RBC: 3.15 MIL/uL — ABNORMAL LOW (ref 3.87–5.11)
RDW: 12.7 % (ref 11.5–15.5)
WBC: 5.1 10*3/uL (ref 4.0–10.5)
nRBC: 0 % (ref 0.0–0.2)

## 2020-05-11 LAB — GASTROINTESTINAL PANEL BY PCR, STOOL (REPLACES STOOL CULTURE)

## 2020-05-11 LAB — TSH: TSH: 0.012 u[IU]/mL — ABNORMAL LOW (ref 0.350–4.500)

## 2020-05-11 LAB — FOLATE: Folate: 20.1 ng/mL (ref >5.9)

## 2020-05-11 LAB — FERRITIN: Ferritin: 480 ng/mL — ABNORMAL HIGH (ref 11–307)

## 2020-05-11 LAB — VITAMIN B12: Vitamin B-12: 1841 pg/mL — ABNORMAL HIGH (ref 180–914)

## 2020-05-11 LAB — PHOSPHORUS: Phosphorus: 1.7 mg/dL — ABNORMAL LOW (ref 2.5–4.6)

## 2020-05-11 LAB — T4, FREE: Free T4: 1.43 ng/dL — ABNORMAL HIGH (ref 0.61–1.12)

## 2020-05-11 LAB — C-REACTIVE PROTEIN: CRP: 1.4 mg/dL — ABNORMAL HIGH (ref <1.0)

## 2020-05-11 LAB — MAGNESIUM: Magnesium: 1.7 mg/dL (ref 1.7–2.4)

## 2020-05-11 LAB — D-DIMER, QUANTITATIVE: D-Dimer, Quant: 0.76 ug/mL-FEU — ABNORMAL HIGH (ref 0.00–0.50)

## 2020-05-11 MED ORDER — LOPERAMIDE HCL 2 MG PO CAPS: 2.0000 mg | ORAL_CAPSULE | ORAL | Status: DC | PRN

## 2020-05-11 MED ORDER — MAGNESIUM SULFATE 2 GM/50ML IV SOLN
2.0000 g | Freq: Once | INTRAVENOUS | Status: AC
  Administered 2020-05-11: 2 g via INTRAVENOUS
  Filled 2020-05-11: qty 50

## 2020-05-11 MED ORDER — METHYLPREDNISOLONE SODIUM SUCC 40 MG IJ SOLR
40.0000 mg | Freq: Two times a day (BID) | INTRAMUSCULAR | Status: DC
  Administered 2020-05-11 – 2020-05-12 (×3): 40 mg via INTRAVENOUS
  Filled 2020-05-11 (×3): qty 1

## 2020-05-11 MED ORDER — ACETAMINOPHEN 325 MG PO TABS
650.0000 mg | ORAL_TABLET | Freq: Four times a day (QID) | ORAL | Status: DC | PRN
  Administered 2020-05-11: 650 mg via ORAL
  Filled 2020-05-11: qty 2

## 2020-05-11 MED ORDER — K PHOS MONO-SOD PHOS DI & MONO 155-852-130 MG PO TABS
500.0000 mg | ORAL_TABLET | Freq: Two times a day (BID) | ORAL | Status: DC
  Administered 2020-05-11 – 2020-05-12 (×3): 500 mg via ORAL
  Filled 2020-05-11 (×3): qty 2

## 2020-05-11 MED ORDER — DEXAMETHASONE SODIUM PHOSPHATE 10 MG/ML IJ SOLN
6.0000 mg | INTRAMUSCULAR | Status: DC
  Administered 2020-05-11: 6 mg via INTRAVENOUS
  Filled 2020-05-11: qty 1

## 2020-05-11 NOTE — Plan of Care (Signed)

## 2020-05-11 NOTE — Progress Notes (Signed)
RN called due to patient developing fever and being hypoxic.  Tylenol was started, supplemental oxygen via [place] at 2 LPM was provided with improvement in O2 sat to 94%.  Patient was started on IV Decadron, she appears to be outside the window for benefit from remdesivir.  She was reported to be in nonsustained SVT, EKG  done showed normal sinus rhythm at a rate of 99 bpm.  Patient was in no acute distress.  We will continue to monitor patient and treat accordingly.

## 2020-05-11 NOTE — Evaluation (Signed)
Physical Therapy Evaluation Patient Details Name: Kristen Mckenzie MRN: [id number] DOB: 1940/09/13 Today's Date: 05/11/2020   History of Present Illness  Kristen Mckenzie is a 80 y.o. female with a history of atrial fibrillation on Eliquis, hypertension, STEMI.  Patient presents with cough over the past 3 weeks.  She been tested for COVID twice at the beginning of her cough, both of which were negative.  She does have positive COVID contacts.  Her physician started her on doxycycline for bronchitis.  Shortly afterwards, she started having diarrhea -multiple episodes a day.  Today, she was in her kitchen when she began to feel weak and feeling like she was going to pass out.  She slowly sat down on the floor.  EMS was called.  The patient was found to be in SVT.  She was given adenosine which converted her heart rate into sinus rhythm with a rate around 100.  She was brought to the hospital for evaluation.    Clinical Impression  Patient demonstrates good return for getting into/out of bed and transferring to commode in bathroom without problem, has to lean on nearby objects for support when standing taking steps mostly due to c/o fatigue/generalized weakness.  Patient able to ambulate safely leaning on IV pole in room without loss of balance and requested to go back to bed after therapy.  Patient will benefit from continued physical therapy in hospital and recommended venue below to increase strength, balance, endurance for safe ADLs and gait.     Follow Up Recommendations Home health PT;Supervision - Intermittent    Equipment Recommendations  None recommended by PT    Recommendations for Other Services       Precautions / Restrictions Precautions Precautions: Fall Restrictions Weight Bearing Restrictions: No      Mobility  Bed Mobility Overal bed mobility: Modified Independent                  Transfers Overall transfer level: Modified independent Equipment used: None                 Ambulation/Gait Ambulation/Gait assistance: Supervision Gait Distance (Feet): 30 Feet Assistive device: IV Pole Gait Pattern/deviations: Decreased step length - right;Decreased step length - left;Decreased stride length Gait velocity: decreased   General Gait Details: slightly labored slow cadence, has to lean on nearby objects for support or both hands on IV pole during ambulation, no loss of balance, limited secondary to fatigue  Stairs            Wheelchair Mobility    Modified Rankin (Stroke Patients Only)       Balance Overall balance assessment: Needs assistance Sitting-balance support: Feet supported;No upper extremity supported Sitting balance-Leahy Scale: Good Sitting balance - Comments: seated at EOB   Standing balance support: During functional activity;No upper extremity supported Standing balance-Leahy Scale: Poor Standing balance comment: fair/poor without AD, fair/good leaning on IV pole                             Pertinent Vitals/Pain Pain Assessment: No/denies pain    Home Living Family/patient expects to be discharged to:: Private residence Living Arrangements: Alone Available Help at Discharge: Family;Available 24 hours/day Type of Home: House Home Access: Stairs to enter Entrance Stairs-Rails: Left Entrance Stairs-Number of Steps: 4 Home Layout: Two level;Able to live on main level with bedroom/bathroom;Full bath on main level Home Equipment: Walker - 2 wheels;Bedside commode;Shower seat - built in;Grab bars -  tub/shower      Prior Function Level of Independence: Independent         Comments: Hydrographic surveyor, drives     Journalist, newspaper        Extremity/Trunk Assessment   Upper Extremity Assessment Upper Extremity Assessment: Overall WFL for tasks assessed    Lower Extremity Assessment Lower Extremity Assessment: Generalized weakness    Cervical / Trunk Assessment Cervical / Trunk  Assessment: Normal  Communication   Communication: No difficulties  Cognition Arousal/Alertness: Awake/alert Behavior During Therapy: WFL for tasks assessed/performed Overall Cognitive Status: Within Functional Limits for tasks assessed                                        General Comments      Exercises     Assessment/Plan    PT Assessment Patient needs continued PT services  PT Problem List Decreased strength;Decreased activity tolerance;Decreased balance;Decreased mobility       PT Treatment Interventions DME instruction;Gait training;Stair training;Functional mobility training;Therapeutic activities;Therapeutic exercise;Patient/family education;Balance training    PT Goals (Current goals can be found in the Care Plan section)  Acute Rehab PT Goals Patient Stated Goal: return home with family to assist PT Goal Formulation: With patient Time For Goal Achievement: 05/15/20 Potential to Achieve Goals: Good    Frequency Min 3X/week   Barriers to discharge        Co-evaluation               AM-PAC PT "6 Clicks" Mobility  Outcome Measure Help needed turning from your back to your side while in a flat bed without using bedrails?: None Help needed moving from lying on your back to sitting on the side of a flat bed without using bedrails?: None Help needed moving to and from a bed to a chair (including a wheelchair)?: A Little Help needed standing up from a chair using your arms (e.g., wheelchair or bedside chair)?: A Little Help needed to walk in hospital room?: A Little Help needed climbing 3-5 steps with a railing? : A Lot 6 Click Score: 19    End of Session   Activity Tolerance: Patient tolerated treatment well;Patient limited by fatigue Patient left: in bed;with call bell/phone within reach Nurse Communication: Mobility status PT Visit Diagnosis: Unsteadiness on feet (R26.81);Other abnormalities of gait and mobility (R26.89);Muscle weakness  (generalized) (M62.81)    Time: 0867-6195 PT Time Calculation (min) (ACUTE ONLY): 28 min   Charges:   PT Evaluation $PT Eval Moderate Complexity: 1 Mod PT Treatments $Therapeutic Activity: 23-37 mins        2:03 PM, 05/11/20 Lonell Grandchild, MPT Physical Therapist with [facility] [phone] office [phone] mobile phone

## 2020-05-11 NOTE — Progress Notes (Addendum)
PROGRESS NOTE  Kristen Mckenzie ZJQ:[id number] DOB: 15-Sep-1940 DOA: 05/09/2020 PCP: Caryl Bis, MD  Brief History:  80 year old female with a history of atrial fibrillation, coronary artery disease with history of NSTEMI, anxiety, hypertension presenting with a near syncopal episode and loose stools.  The patient states that she was getting up from the couch to go to the kitchen when she felt dizzy to the point where she had to lower herself to the ground and sit on the ground for a few moments.  She denied any frank loss of consciousness.  She denied any aura, chest discomfort, shortness of breath, palpitations.  EMS was activated, and the patient was reportedly in SVT with heart rate 200s.  She was given adenosine with conversion to normal sinus rhythm with heart rate 100.  In the emergency department, the patient remained in sinus rhythm. Notably, the patient states that she has been exposed to her daughter who is COVID-positive.  She states that she began having coughing, chest congestion, nasal congestion and intermittent rhinorrhea on 04/21/2020.  She went to see her PCP and was placed on some type of antibiotic for her bronchitis.  She went back to her PCP on 04/24/2020 and underwent a COVID test at her PCPs office.  It was reportedly negative.  Nevertheless, she was given prednisone and continued on her antibiotics.  Over the next 2 weeks, the patient continued to have coughing and chest congestion.  She went back to see her PCP on 05/06/2019 and was placed on doxycycline.  She stated that after taking the doxycycline she began having loose stools up to 3-4 bowel movements on a daily basis without any hematochezia or melena.  She started taking the doxycycline on the morning of 05/09/2019.  There is no hematochezia, melena, abdominal pain.  She denies any hemoptysis, shortness of breath, headache, sore throat.  She has had some intermittent chest pain and upper abdominal pain when she  coughs.  She has had some fevers and chills, but states that her temperature was never above 100.0 F. In the emergency department, the patient was febrile up to 1 to 2.6 F with soft blood pressures.  Orthostatics were positive.  Oxygen saturation 97-90% on room air.  BMP showed a sodium of 125 with serum creatinine 0.88.  LFTs were unremarkable.  WBC 4.3, hemoglobin 11.6, platelets 114,000.  Chest x-ray showed bilateral patchy infiltrates, right greater than left.   Assessment/Plan: COVID-19 pneumonia -The patient has been fairly asymptomatic except for her cough -personally reviewed CXR--patchy bilateral infiltrates R>L -Stable on room air -CRP 1.9>>2.7>>1.4 -Ferritin 480>> -D-dimer 0.69>>0.76 -PCT <0.10 -Lactic acid 0.7>> 1.5 -Given the patient's history and duration of symptoms, it appears that she is outside the window of benefit for remdesivir (she refuses it anyway)  Orthostatic hypotension/near syncope -Secondary to volume depletion -Orthostatic vital signs positive in the ED -Continue IV fluids  Fever -had fever 102.7 evening 1/16 -blood cultures remain neg -check UA -CXR--patchy infiltrates -repeat PCT -Cdiff neg -due to COVID 19 infection  Diarrhea -May be a combination of her doxycycline versus COVID-19 enteritis -Check C. Difficile--neg -Stool pathogen panel--pending -start loperamide prn   Paroxysmal atrial fibrillation -Currently in sinus rhythm -Continue metoprolol tartrate -had SVT early am 05/11/20--personally reviewed EKG--sinus nonspecific TWI -Continue apixaban  Hyperlipidemia -Continue statin  Essential hypertension -Holding ARB secondary to soft blood pressure -Holding spironolactone  Coronary artery disease -No chest pain presently -Personally reviewed EKG--sinus rhythm, no  ST-T wave changes -Continue apixaban  Hyponatremia -Secondary to volume depletion -Continue IV fluids  Thrombocytopenia -due to viral infection -B12  1841 -Folate 20.1 -INR elevated due to apixaban -serial CBC, monitor for bleed  Hypophosphatemia/Hypomagneseia -replete     Status is: Inpatient  Remains inpatient appropriate because:IV treatments appropriate due to intensity of illness or inability to take PO   Dispo: The patient is from: Home              Anticipated d/c is to: Home              Anticipated d/c date is: 1 day              Patient currently is not medically stable to d/c.        Family Communication:   No Family at bedside  Consultants:  none  Code Status:  FULL  DVT Prophylaxis:  apixaban   Procedures: As Listed in Progress Note Above  Antibiotics: None       Subjective: Patient complains of dyspnea on exertion and diarrhea.  No cp, n/v.  No hematochezia, no abd pain, no melena.  Had 5-6 loose stools.  Objective: Vitals:   05/11/20 0400 05/11/20 0617 05/11/20 0722 05/11/20 0814  BP: (!) 151/114 (!) 115/49  (!) 113/51  Pulse: (!) 108 92  79  Resp: (!) 22     Temp: (!) 100.9 F (38.3 C) (!) 102.7 F (39.3 C)  98.7 F (37.1 C)  TempSrc: Oral Oral    SpO2: (!) 88% 94% 95% 90%  Weight:      Height:        Intake/Output Summary (Last 24 hours) at 05/11/2020 1154 Last data filed at 05/10/2020 1500 Gross per 24 hour  Intake 438.75 ml  Output --  Net 438.75 ml   Weight change:  Exam:   General:  Pt is alert, follows commands appropriately, not in acute distress  HEENT: No icterus, No thrush, No neck mass, [place]/AT  Cardiovascular: RRR, S1/S2, no rubs, no gallops  Respiratory: CTA bilaterally, no wheezing, no crackles, no rhonchi  Abdomen: Soft/+BS, non tender, non distended, no guarding  Extremities: No edema, No lymphangitis, No petechiae, No rashes, no synovitis   Data Reviewed: I have personally reviewed following labs and imaging studies Basic Metabolic Panel: Recent Labs  Lab 05/09/20 1240 05/09/20 2000 05/10/20 0744 05/11/20 0500  NA 125* 127* 129* 130*  K  4.3 4.1 4.2 3.6  CL 98 101 103 106  CO2 20* 20* 18* 18*  GLUCOSE 127* 98 138* 128*  BUN 23 21 15 16   CREATININE 0.88 0.67 0.53 0.57  CALCIUM 8.1* 7.8* 8.0* 7.5*  MG  --   --  1.6* 1.7  PHOS  --   --  2.9 1.7*   Liver Function Tests: Recent Labs  Lab 05/09/20 1240 05/10/20 0744 05/11/20 0500  AST 34 33 37  ALT 20 19 18   ALKPHOS 71 59 56  BILITOT 0.7 0.8 0.6  PROT 5.9* 5.3* 5.0*  ALBUMIN 3.2* 2.8* 2.8*   No results for input(s): LIPASE, AMYLASE in the last 168 hours. No results for input(s): AMMONIA in the last 168 hours. Coagulation Profile: Recent Labs  Lab 05/09/20 1240  INR 1.5*   CBC: Recent Labs  Lab 05/09/20 1240 05/10/20 0744 05/11/20 0500  WBC 4.3 1.9* 5.1  NEUTROABS 3.7 1.4* 4.5  HGB 11.6* 10.8* 10.2*  HCT 33.4* 31.3* 29.4*  MCV 93.0 93.2 93.3  PLT 114* 105* 121*  Cardiac Enzymes: No results for input(s): CKTOTAL, CKMB, CKMBINDEX, TROPONINI in the last 168 hours. BNP: Invalid input(s): POCBNP CBG: Recent Labs  Lab 05/09/20 1203  GLUCAP 128*   HbA1C: No results for input(s): HGBA1C in the last 72 hours. Urine analysis: No results found for: COLORURINE, APPEARANCEUR, LABSPEC, PHURINE, GLUCOSEU, HGBUR, BILIRUBINUR, KETONESUR, [place], UROBILINOGEN, NITRITE, LEUKOCYTESUR Sepsis Labs: @LABRCNTIP (procalcitonin:4,lacticidven:4) ) Recent Results (from the past 240 hour(s))  Culture, blood (Routine x 2)     Status: None (Preliminary result)   Collection Time: 05/09/20 12:51 PM   Specimen: BLOOD LEFT FOREARM  Result Value Ref Range Status   Specimen Description   Final    BLOOD LEFT FOREARM BOTTLES DRAWN AEROBIC AND ANAEROBIC   Special Requests Blood Culture adequate volume  Final   Culture   Final    NO GROWTH 2 DAYS Performed at [facility], [address] [place], [place] [address]    Report Status PENDING  Incomplete  Culture, blood (Routine x 2)     Status: None (Preliminary result)   Collection Time: 05/09/20 12:51 PM   Specimen:  BLOOD RIGHT WRIST  Result Value Ref Range Status   Specimen Description   Final    BLOOD RIGHT WRIST BOTTLES DRAWN AEROBIC AND ANAEROBIC   Special Requests Blood Culture adequate volume  Final   Culture   Final    NO GROWTH 2 DAYS Performed at [facility], [address] [place], [place] [address]    Report Status PENDING  Incomplete  Resp Panel by RT-PCR (Flu A&B, Covid) Nasopharyngeal Swab     Status: Abnormal   Collection Time: 05/09/20  1:52 PM   Specimen: Nasopharyngeal Swab; Nasopharyngeal(NP) swabs in vial transport medium  Result Value Ref Range Status   SARS Coronavirus 2 by RT PCR POSITIVE (A) NEGATIVE Final    Comment: RESULT CALLED TO, READ BACK BY AND VERIFIED WITH: [place],K @ 1822 ON 05/09/20 BY JUW (NOTE) SARS-CoV-2 target nucleic acids are DETECTED.  The SARS-CoV-2 RNA is generally detectable in upper respiratory specimens during the acute phase of infection. Positive results are indicative of the presence of the identified virus, but do not rule out bacterial infection or co-infection with other pathogens not detected by the test. Clinical correlation with patient history and other diagnostic information is necessary to determine patient infection status. The expected result is Negative.  Fact Sheet for Patients: EntrepreneurPulse.com.au  Fact Sheet for Healthcare Providers: IncredibleEmployment.be  This test is not yet approved or cleared by the [place] FDA and  has been authorized for detection and/or diagnosis of SARS-CoV-2 by FDA under an Emergency Use Authorization (EUA).  This EUA will remain in effect (meaning this test ca n be used) for the duration of  the COVID-19 declaration under Section 564(b)(1) of the Act, 21 U.S.C. section 360bbb-3(b)(1), unless the authorization is terminated or revoked sooner.     Influenza A by PCR NEGATIVE NEGATIVE Final   Influenza B by PCR NEGATIVE NEGATIVE Final     Comment: (NOTE) The Xpert Xpress SARS-CoV-2/FLU/RSV plus assay is intended as an aid in the diagnosis of influenza from Nasopharyngeal swab specimens and should not be used as a sole basis for treatment. Nasal washings and aspirates are unacceptable for Xpert Xpress SARS-CoV-2/FLU/RSV testing.  Fact Sheet for Patients: EntrepreneurPulse.com.au  Fact Sheet for Healthcare Providers: IncredibleEmployment.be  This test is not yet approved or cleared by the [place] FDA and has been authorized for detection and/or diagnosis of SARS-CoV-2 by FDA under an Emergency  Use Authorization (EUA). This EUA will remain in effect (meaning this test can be used) for the duration of the COVID-19 declaration under Section 564(b)(1) of the Act, 21 U.S.C. section 360bbb-3(b)(1), unless the authorization is terminated or revoked.  Performed at [facility], [address] [place] [address]   C Difficile Quick Screen w PCR reflex     Status: None   Collection Time: 05/10/20 10:56 AM   Specimen: STOOL  Result Value Ref Range Status   C Diff antigen NEGATIVE NEGATIVE Final   C Diff toxin NEGATIVE NEGATIVE Final   C Diff interpretation No C. difficile detected.  Final    Comment: Performed at [facility], [address] [place], [place] [address]     Scheduled Meds: . albuterol  2 puff Inhalation TID  . apixaban  2.5 mg Oral BID  . vitamin C  500 mg Oral Daily  . atorvastatin  20 mg Oral Daily  . fluticasone  1 spray Each Nare BID  . methylPREDNISolone (SOLU-MEDROL) injection  40 mg Intravenous Q12H  . metoprolol tartrate  12.5 mg Oral BID  . phosphorus  500 mg Oral BID  . zinc sulfate  220 mg Oral Daily   Continuous Infusions: . sodium chloride 75 mL/hr at 05/11/20 0228  . magnesium sulfate bolus IVPB      Procedures/Studies: CT Head Wo Contrast  Result Date: 05/09/2020 CLINICAL DATA:  Generalized weakness 1 week with syncopal episode today.  Denies head injury. EXAM: CT HEAD WITHOUT CONTRAST TECHNIQUE: Contiguous axial images were obtained from the base of the skull through the vertex without intravenous contrast. COMPARISON:  None. FINDINGS: Brain: Ventricles, cisterns and other CSF spaces are normal. There is no mass, mass effect, shift of midline structures or acute hemorrhage. Minimal left basal ganglia calcification. No evidence of acute infarction. Vascular: No hyperdense vessel or unexpected calcification. Skull: Normal. Negative for fracture or focal lesion. Sinuses/Orbits: No acute finding. Other: None. IMPRESSION: No acute findings. Electronically Signed   By: Marin Olp M.D.   On: 05/09/2020 13:13   DG Chest Port 1 View  Result Date: 05/09/2020 CLINICAL DATA:  Cough and weakness for 1 week, COVID exposure EXAM: PORTABLE CHEST 1 VIEW COMPARISON:  09/27/2019 chest radiograph. FINDINGS: Stable cardiomediastinal silhouette with normal heart size. No pneumothorax. No pleural effusion. Moderate patchy opacities in both lungs, right greater than left, most prominent in the mid to lower right lung. IMPRESSION: Moderate patchy opacities in both lungs, right greater than left, suspicious for COVID-19 pneumonia. Electronically Signed   By: Ilona Sorrel M.D.   On: 05/09/2020 12:28    Orson Eva, DO  [facility]  If 7PM-7AM, please contact night-coverage [email or web address] Password [email or web address] 05/11/2020, 11:54 AM   LOS: 2 days

## 2020-05-11 NOTE — Plan of Care (Signed)
  Problem: Acute Rehab PT Goals(only PT should resolve) Goal: Pt Will Go Supine/Side To Sit Outcome: Progressing Flowsheets (Taken 05/11/2020 1410) Pt will go Supine/Side to Sit:  Independently  with modified independence Goal: Patient Will Transfer Sit To/From Stand Outcome: Progressing Flowsheets (Taken 05/11/2020 1410) Patient will transfer sit to/from stand:  Independently  with modified independence Goal: Pt Will Transfer Bed To Chair/Chair To Bed Outcome: Progressing Flowsheets (Taken 05/11/2020 1410) Pt will Transfer Bed to Chair/Chair to Bed:  Independently  with modified independence Goal: Pt Will Ambulate Outcome: Progressing Flowsheets (Taken 05/11/2020 1410) Pt will Ambulate:  75 feet  with supervision  with modified independence  with least restrictive assistive device   2:11 PM, 05/11/20 Lonell Grandchild, MPT Physical Therapist with [facility] [phone] office [phone] mobile phone

## 2020-05-12 DIAGNOSIS — R55 Syncope and collapse: Secondary | ICD-10-CM | POA: Diagnosis not present

## 2020-05-12 DIAGNOSIS — E871 Hypo-osmolality and hyponatremia: Secondary | ICD-10-CM | POA: Diagnosis not present

## 2020-05-12 DIAGNOSIS — U071 COVID-19: Secondary | ICD-10-CM | POA: Diagnosis not present

## 2020-05-12 DIAGNOSIS — I10 Essential (primary) hypertension: Secondary | ICD-10-CM | POA: Diagnosis not present

## 2020-05-12 LAB — BASIC METABOLIC PANEL
Anion gap: 7 (ref 5–15)
BUN: 21 mg/dL (ref 8–23)
CO2: 20 mmol/L — ABNORMAL LOW (ref 22–32)
Calcium: 7.9 mg/dL — ABNORMAL LOW (ref 8.9–10.3)
Chloride: 107 mmol/L (ref 98–111)
Creatinine, Ser: 0.59 mg/dL (ref 0.44–1.00)
GFR, Estimated: >60 mL/min (ref >60)
Glucose, Bld: 147 mg/dL — ABNORMAL HIGH (ref 70–99)
Potassium: 4.2 mmol/L (ref 3.5–5.1)
Sodium: 134 mmol/L — ABNORMAL LOW (ref 135–145)

## 2020-05-12 LAB — CBC WITH DIFFERENTIAL/PLATELET
Abs Immature Granulocytes: 0.01 10*3/uL (ref 0.00–0.07)
Basophils Absolute: 0 10*3/uL (ref 0.0–0.1)
Basophils Relative: 0 %
Eosinophils Absolute: 0 10*3/uL (ref 0.0–0.5)
Eosinophils Relative: 0 %
HCT: 27 % — ABNORMAL LOW (ref 36.0–46.0)
Hemoglobin: 9.2 g/dL — ABNORMAL LOW (ref 12.0–15.0)
Immature Granulocytes: 0 %
Lymphocytes Relative: 12 %
Lymphs Abs: 0.3 10*3/uL — ABNORMAL LOW (ref 0.7–4.0)
MCH: 32.2 pg (ref 26.0–34.0)
MCHC: 34.1 g/dL (ref 30.0–36.0)
MCV: 94.4 fL (ref 80.0–100.0)
Monocytes Absolute: 0.2 10*3/uL (ref 0.1–1.0)
Monocytes Relative: 6 %
Neutro Abs: 1.9 10*3/uL (ref 1.7–7.7)
Neutrophils Relative %: 82 %
Platelets: 101 10*3/uL — ABNORMAL LOW (ref 150–400)
RBC: 2.86 MIL/uL — ABNORMAL LOW (ref 3.87–5.11)
RDW: 12.9 % (ref 11.5–15.5)
WBC: 2.4 10*3/uL — ABNORMAL LOW (ref 4.0–10.5)
nRBC: 0 % (ref 0.0–0.2)

## 2020-05-12 LAB — PHOSPHORUS: Phosphorus: 3.1 mg/dL (ref 2.5–4.6)

## 2020-05-12 LAB — HEPATIC FUNCTION PANEL
ALT: 19 U/L (ref 0–44)
AST: 32 U/L (ref 15–41)
Albumin: 2.5 g/dL — ABNORMAL LOW (ref 3.5–5.0)
Alkaline Phosphatase: 51 U/L (ref 38–126)
Bilirubin, Direct: 0.1 mg/dL (ref 0.0–0.2)
Indirect Bilirubin: 0.3 mg/dL (ref 0.3–0.9)
Total Bilirubin: 0.4 mg/dL (ref 0.3–1.2)
Total Protein: 4.9 g/dL — ABNORMAL LOW (ref 6.5–8.1)

## 2020-05-12 LAB — PROCALCITONIN: Procalcitonin: <0.1 ng/mL

## 2020-05-12 LAB — D-DIMER, QUANTITATIVE: D-Dimer, Quant: 0.71 ug/mL-FEU — ABNORMAL HIGH (ref 0.00–0.50)

## 2020-05-12 LAB — MAGNESIUM: Magnesium: 2.3 mg/dL (ref 1.7–2.4)

## 2020-05-12 LAB — C-REACTIVE PROTEIN: CRP: 2.8 mg/dL — ABNORMAL HIGH (ref <1.0)

## 2020-05-12 MED ORDER — PREDNISONE 20 MG PO TABS: 40.0000 mg | ORAL_TABLET | Freq: Two times a day (BID) | ORAL | 0 refills | Status: DC

## 2020-05-12 MED ORDER — APIXABAN 2.5 MG PO TABS: 2.5000 mg | ORAL_TABLET | Freq: Two times a day (BID) | ORAL | Status: DC

## 2020-05-12 MED ORDER — ALBUTEROL SULFATE HFA 108 (90 BASE) MCG/ACT IN AERS: 2.0000 | INHALATION_SPRAY | Freq: Two times a day (BID) | RESPIRATORY_TRACT | Status: DC

## 2020-05-12 NOTE — Progress Notes (Signed)
Pt educated on how to use home o2, explained how to turn on, change and use o2. Pt demonstrated the process and expressed understanding. Discharge paper and medication has been explained. Currently waiting on daughter to pick her up.

## 2020-05-12 NOTE — TOC Transition Note (Signed)
Transition of Care [facility]) - CM/SW Discharge Note   Patient Details  Name: Kristen Mckenzie MRN: [id number] Date of Birth: Dec 25, 1940  [facility] of [facility]) CM/SW Contact:  Salome Arnt, LCSW Phone Number: 05/12/2020, 10:16 AM   Clinical Narrative:  Anticipate d/c later today. Per MD, pt will require home O2. Sat qualification note and order are in. Discussed with daughter who is agreeable to refer to [facility]. Referral made to [facility]. Barbaraann Rondo given son-in-law's contact information Fulton Reek [phone]) for home set up.      Final next level of care: Home/Self Care Barriers to Discharge: Barriers Resolved   Patient Goals and CMS Choice Patient states their goals for this hospitalization and ongoing recovery are:: return home   Choice offered to / list presented to : [place]  Discharge Placement                       Discharge Plan and Services In-house Referral: Clinical Social Work              DME Arranged: Oxygen DME Agency: [facility] Date DME Agency Contacted: 05/12/20 Time DME Agency Contacted: [phone] Representative spoke with at DME Agency: [place]: Refused [place]          Social Determinants of Health ([place]) Interventions     Readmission Risk Interventions No flowsheet data found.

## 2020-05-12 NOTE — Care Management Important Message (Signed)
Important Message  Patient Details  Name: Kristen Mckenzie MRN: [id number] Date of Birth: May 04, 1940   Medicare Important Message Given:  Yes Marye Round, RN will deliver letter.)     Tommy Medal 05/12/2020, 12:24 PM

## 2020-05-12 NOTE — Progress Notes (Signed)
   05/12/20 0605  Assess: MEWS Score  Temp 97.9 F (36.6 C)  BP 130/66  Pulse Rate 61  Resp (!) 28  Level of Consciousness Alert  SpO2 92 %  O2 Device Room Air  Assess: MEWS Score  MEWS Temp 0  MEWS Systolic 0  MEWS Pulse 0  MEWS RR 2  MEWS LOC 0  MEWS Score 2  MEWS Score Color Yellow  Assess: if the MEWS score is Yellow or Red  Were vital signs taken at a resting state? Yes  Focused Assessment Change from prior assessment (see assessment flowsheet)  Early Detection of Sepsis Score *See Row Information* Low  MEWS guidelines implemented *See Row Information* Yes  Treat  MEWS Interventions Administered prn meds/treatments (getting neb tx)  Pain Scale 0-10  Take Vital Signs  Increase Vital Sign Frequency  Yellow: Q 2hr X 2 then Q 4hr X 2, if remains yellow, continue Q 4hrs  Escalate  MEWS: Escalate Yellow: discuss with charge nurse/RN and consider discussing with provider and RRT  Notify: Charge Nurse/RN  Name of Charge Nurse/RN Notified Deeann Dowse  Date Charge Nurse/RN Notified 05/12/20  Time Charge Nurse/RN Notified [phone]  Document  Patient Outcome Other (Comment) (O2 sat in low 90's on room air.  Getting inhaler tx)

## 2020-05-12 NOTE — Progress Notes (Signed)
SATURATION QUALIFICATIONS: (This note is used to comply with regulatory documentation for home oxygen)  Patient Saturations on Room Air at Rest = 94  Patient Saturations on Room Air while Ambulating = 84  Patient Saturations on 36 Liters of oxygen while Ambulating = 94  Please briefly explain why patient needs home oxygen: To maintain 02 sat at 90% or above during ambulation.   DTat

## 2020-05-12 NOTE — Discharge Summary (Addendum)
Physician Discharge Summary  Kristen Mckenzie [id number] DOB: Sep 22, 1940 DOA: 05/09/2020  PCP: Caryl Bis, MD  Admit date: 05/09/2020 Discharge date: 05/12/2020  Admitted From: Home Disposition:  Home   Recommendations for Outpatient Follow-up:  1. Follow up with PCP in 1-2 weeks 2. Please obtain BMP/CBC in one week   Home Health: Daughter refuses  Discharge Condition: Stable CODE STATUS: FULL Diet recommendation: Heart Healthy   Brief/Interim Summary: 80 year old female with a history of atrial fibrillation, coronary artery disease with history of NSTEMI, anxiety, hypertension presenting with a near syncopal episode and loose stools. The patient states that she was getting up from the couch to go to the kitchen when she felt dizzy to the point where she had to lower herself to the ground and sit on the ground for a few moments. She denied any frank loss of consciousness. She denied any aura, chest discomfort, shortness of breath, palpitations. EMS was activated, and the patient was reportedly in SVT with heart rate 200s.She was given adenosine with conversion to normal sinus rhythm with heart rate100.In the emergency department, the patient remained in sinus rhythm. Notably, the patient states that she has been exposed to her daughter who is COVID-positive. She states that she began having coughing, chest congestion, nasal congestion and intermittent rhinorrhea on 04/21/2020. She went to see her PCP and was placed on some type of antibiotic for her bronchitis. She went back to her PCP on 04/24/2020 and underwent a COVID test at her PCPs office. It was reportedly negative. Nevertheless, she was given prednisone and continued on her antibiotics. Over the next 2 weeks, the patient continued to have coughing and chest congestion. She went back to see her PCP on 05/06/2019 and was placed on doxycycline. She stated that after taking the doxycycline she began having loose  stools up to 3-4 bowel movements on a daily basis without any hematochezia or melena. She started taking the doxycycline on the morning of 05/09/2019. There is no hematochezia, melena, abdominal pain. She denies any hemoptysis, shortness of breath, headache, sore throat. She has had some intermittent chest pain and upper abdominal pain when she coughs. She has had some fevers and chills, but states that her temperature was never above 100.0 F. In the emergency department, the patient was febrile up to 1 to 2.6 F with soft blood pressures. Orthostatics were positive. Oxygen saturation 97-90% on room air. BMP showed a sodium of 125 with serum creatinine 0.88. LFTs were unremarkable. WBC 4.3, hemoglobin 11.6, platelets 114,000. Chest x-ray showed bilateral patchy infiltrates, right greater than left.  Discharge Diagnoses:  Acute Respiratory Failure due to COVID-19 pneumonia -The patient has been fairly asymptomatic except for her cough -personally reviewed CXR--patchy bilateral infiltrates R>L -Stable on room air but desaturates with ambulation -CRP 1.9>>2.7>>1.4 -Ferritin 480>> -D-dimer 0.69>>0.76 -PCT<0.10 -Lactic acid 0.7>>1.5 -Given the patient's history and duration of symptoms, it appears that she is outside the window of benefit for remdesivir(she refuses it anyway) -started IV steroids-->d/c home with prednisone x 6 days -personally performed ambulatory pulse ox-->desaturates to 84%  Orthostatic hypotension/near syncope -Secondary to volume depletion -Orthostatic vital signs positive in the ED -Continued IV fluids -repeat orthostatics neg  Fever -had fever 102.7 evening 1/16 -blood cultures remain neg -no dysuria -CXR--patchy infiltrates -repeat PCT <0.10 -Cdiff neg -due to COVID 19 infection  Diarrhea -May be a combination of her doxycycline versus COVID-19 enteritis -Check C. Difficile--neg -Stool pathogen panel--neg -start loperamide prn   Paroxysmal  atrial fibrillation -Currently  in sinus rhythm -Continue metoprolol tartrate -had SVT early am 05/11/20--personally reviewed EKG--sinus nonspecific TWI -Continue apixaban but decrease dose to 2.5 mg bid as patient is 80 y/o and weighs 54kg  Hyperlipidemia -Continue statin  Essential hypertension -Holding ARB secondary to soft blood pressure--hold for now and follow up with PCP/cardiology -Holding spironolactone--restart  Coronary artery disease -No chest pain presently -Personally reviewed EKG--sinus rhythm, no ST-T wave changes -Continue apixaban  Hyponatremia -Secondary to volume depletion -Continue IV fluids -Na 125>>134  Thrombocytopenia -due to viral infection -B12 1841 -Folate 20.1 -INR elevated due to apixaban -serial CBC, monitor for bleed -no bleeding; drop in Hgb dilutional  Hypophosphatemia/Hypomagneseia -repleted and improved   Discharge Instructions   Allergies as of 05/12/2020      Reactions   Phenobarbital Hives, Swelling   Tamiflu [oseltamivir Phosphate] Swelling, Rash   Depakote [valproic Acid] Hives, Itching   Lisinopril Swelling   PT can only take low dosage      Medication List    STOP taking these medications   doxycycline 100 MG tablet Commonly known as: VIBRA-TABS   valsartan 320 MG tablet Commonly known as: DIOVAN     TAKE these medications   acetaminophen 325 MG tablet Commonly known as: TYLENOL Take 2 tablets (650 mg total) by mouth every 4 (four) hours as needed for headache or mild pain.   apixaban 2.5 MG Tabs tablet Commonly known as: ELIQUIS Take 1 tablet (2.5 mg total) by mouth 2 (two) times daily. What changed:   medication strength  how much to take   atorvastatin 40 MG tablet Commonly known as: LIPITOR Take 0.5 tablets (20 mg total) by mouth daily.   CALCARB 600/D PO Take 1 tablet by mouth daily.   cholecalciferol 1000 units tablet Commonly known as: VITAMIN D Take 1,000 Units daily by mouth.    COD LIVER OIL PO Take 1 capsule by mouth every other day.   metoprolol tartrate 25 MG tablet Commonly known as: LOPRESSOR Take 0.5 tablets (12.5 mg total) by mouth 2 (two) times daily.   multivitamin with minerals Tabs tablet Take 1 tablet daily by mouth.   pantoprazole 40 MG tablet Commonly known as: PROTONIX Take 40 mg by mouth daily as needed (heartburn).   Potassium 99 MG Tabs Take 99 mg by mouth 3 (three) times a week.   predniSONE 20 MG tablet Commonly known as: DELTASONE Take 2 tablets (40 mg total) by mouth 2 (two) times daily with a meal.   spironolactone 25 MG tablet Commonly known as: ALDACTONE Take 1 tablet (25 mg total) by mouth daily.   vitamin A 10000 UNIT capsule Take 10,000 Units by mouth every other day.   zinc gluconate 50 MG tablet Take 50 mg by mouth every other day.   zolpidem 5 MG tablet Commonly known as: AMBIEN Take 5 mg by mouth at bedtime as needed for sleep.            Durable Medical Equipment  (From admission, onward)         Start     Ordered   05/12/20 0952  For home use only DME oxygen  Once       Question Answer Comment  Length of Need 6 Months   Liters per Minute 2   Frequency Continuous (stationary and portable oxygen unit needed)   Oxygen conserving device Yes   Oxygen delivery system Gas      05/12/20 0951          Allergies  Allergen Reactions  . Phenobarbital Hives and Swelling  . Tamiflu [Oseltamivir Phosphate] Swelling and Rash  . Depakote [Valproic Acid] Hives and Itching  . Lisinopril Swelling    PT can only take low dosage    Consultations:  none   Procedures/Studies: CT Head Wo Contrast  Result Date: 05/09/2020 CLINICAL DATA:  Generalized weakness 1 week with syncopal episode today. Denies head injury. EXAM: CT HEAD WITHOUT CONTRAST TECHNIQUE: Contiguous axial images were obtained from the base of the skull through the vertex without intravenous contrast. COMPARISON:  None. FINDINGS: Brain:  Ventricles, cisterns and other CSF spaces are normal. There is no mass, mass effect, shift of midline structures or acute hemorrhage. Minimal left basal ganglia calcification. No evidence of acute infarction. Vascular: No hyperdense vessel or unexpected calcification. Skull: Normal. Negative for fracture or focal lesion. Sinuses/Orbits: No acute finding. Other: None. IMPRESSION: No acute findings. Electronically Signed   By: Marin Olp M.D.   On: 05/09/2020 13:13   DG Chest Port 1 View  Result Date: 05/09/2020 CLINICAL DATA:  Cough and weakness for 1 week, COVID exposure EXAM: PORTABLE CHEST 1 VIEW COMPARISON:  09/27/2019 chest radiograph. FINDINGS: Stable cardiomediastinal silhouette with normal heart size. No pneumothorax. No pleural effusion. Moderate patchy opacities in both lungs, right greater than left, most prominent in the mid to lower right lung. IMPRESSION: Moderate patchy opacities in both lungs, right greater than left, suspicious for COVID-19 pneumonia. Electronically Signed   By: Ilona Sorrel M.D.   On: 05/09/2020 12:28        Discharge Exam: Vitals:   05/12/20 0810 05/12/20 1028  BP: (!) 141/68 140/64  Pulse: 80 70  Resp: 20 20  Temp: 98.6 F (37 C) 98.4 F (36.9 C)  SpO2: 95% 98%   Vitals:   05/12/20 0605 05/12/20 0630 05/12/20 0810 05/12/20 1028  BP: 130/66  (!) 141/68 140/64  Pulse: 61  80 70  Resp: (!) 28  20 20   Temp: 97.9 F (36.6 C)  98.6 F (37 C) 98.4 F (36.9 C)  TempSrc: Oral   Oral  SpO2: 92% 90% 95% 98%  Weight:      Height:        General: Pt is alert, awake, not in acute distress Cardiovascular: RRR, S1/S2 +, no rubs, no gallops Respiratory: bibasilar rales. No wheeze Abdominal: Soft, NT, ND, bowel sounds + Extremities: no edema, no cyanosis   The results of significant diagnostics from this hospitalization (including imaging, microbiology, ancillary and laboratory) are listed below for reference.    Significant Diagnostic Studies: CT  Head Wo Contrast  Result Date: 05/09/2020 CLINICAL DATA:  Generalized weakness 1 week with syncopal episode today. Denies head injury. EXAM: CT HEAD WITHOUT CONTRAST TECHNIQUE: Contiguous axial images were obtained from the base of the skull through the vertex without intravenous contrast. COMPARISON:  None. FINDINGS: Brain: Ventricles, cisterns and other CSF spaces are normal. There is no mass, mass effect, shift of midline structures or acute hemorrhage. Minimal left basal ganglia calcification. No evidence of acute infarction. Vascular: No hyperdense vessel or unexpected calcification. Skull: Normal. Negative for fracture or focal lesion. Sinuses/Orbits: No acute finding. Other: None. IMPRESSION: No acute findings. Electronically Signed   By: Marin Olp M.D.   On: 05/09/2020 13:13   DG Chest Port 1 View  Result Date: 05/09/2020 CLINICAL DATA:  Cough and weakness for 1 week, COVID exposure EXAM: PORTABLE CHEST 1 VIEW COMPARISON:  09/27/2019 chest radiograph. FINDINGS: Stable cardiomediastinal silhouette with normal heart size.  No pneumothorax. No pleural effusion. Moderate patchy opacities in both lungs, right greater than left, most prominent in the mid to lower right lung. IMPRESSION: Moderate patchy opacities in both lungs, right greater than left, suspicious for COVID-19 pneumonia. Electronically Signed   By: Ilona Sorrel M.D.   On: 05/09/2020 12:28     Microbiology: Recent Results (from the past 240 hour(s))  Culture, blood (Routine x 2)     Status: None (Preliminary result)   Collection Time: 05/09/20 12:51 PM   Specimen: BLOOD LEFT FOREARM  Result Value Ref Range Status   Specimen Description   Final    BLOOD LEFT FOREARM BOTTLES DRAWN AEROBIC AND ANAEROBIC   Special Requests Blood Culture adequate volume  Final   Culture   Final    NO GROWTH 3 DAYS Performed at [facility], [address] [place], [place] [address]    Report Status PENDING  Incomplete  Culture, blood (Routine x  2)     Status: None (Preliminary result)   Collection Time: 05/09/20 12:51 PM   Specimen: BLOOD RIGHT WRIST  Result Value Ref Range Status   Specimen Description   Final    BLOOD RIGHT WRIST BOTTLES DRAWN AEROBIC AND ANAEROBIC   Special Requests Blood Culture adequate volume  Final   Culture   Final    NO GROWTH 3 DAYS Performed at [facility], [address] [place], [place] [address]    Report Status PENDING  Incomplete  Resp Panel by RT-PCR (Flu A&B, Covid) Nasopharyngeal Swab     Status: Abnormal   Collection Time: 05/09/20  1:52 PM   Specimen: Nasopharyngeal Swab; Nasopharyngeal(NP) swabs in vial transport medium  Result Value Ref Range Status   SARS Coronavirus 2 by RT PCR POSITIVE (A) NEGATIVE Final    Comment: RESULT CALLED TO, READ BACK BY AND VERIFIED WITH: [place],K @ 1822 ON 05/09/20 BY JUW (NOTE) SARS-CoV-2 target nucleic acids are DETECTED.  The SARS-CoV-2 RNA is generally detectable in upper respiratory specimens during the acute phase of infection. Positive results are indicative of the presence of the identified virus, but do not rule out bacterial infection or co-infection with other pathogens not detected by the test. Clinical correlation with patient history and other diagnostic information is necessary to determine patient infection status. The expected result is Negative.  Fact Sheet for Patients: EntrepreneurPulse.com.au  Fact Sheet for Healthcare Providers: IncredibleEmployment.be  This test is not yet approved or cleared by the [place] FDA and  has been authorized for detection and/or diagnosis of SARS-CoV-2 by FDA under an Emergency Use Authorization (EUA).  This EUA will remain in effect (meaning this test ca n be used) for the duration of  the COVID-19 declaration under Section 564(b)(1) of the Act, 21 U.S.C. section 360bbb-3(b)(1), unless the authorization is terminated or revoked sooner.      Influenza A by PCR NEGATIVE NEGATIVE Final   Influenza B by PCR NEGATIVE NEGATIVE Final    Comment: (NOTE) The Xpert Xpress SARS-CoV-2/FLU/RSV plus assay is intended as an aid in the diagnosis of influenza from Nasopharyngeal swab specimens and should not be used as a sole basis for treatment. Nasal washings and aspirates are unacceptable for Xpert Xpress SARS-CoV-2/FLU/RSV testing.  Fact Sheet for Patients: EntrepreneurPulse.com.au  Fact Sheet for Healthcare Providers: IncredibleEmployment.be  This test is not yet approved or cleared by the [place] FDA and has been authorized for detection and/or diagnosis of SARS-CoV-2 by FDA under an Emergency Use Authorization (EUA). This EUA will  remain in effect (meaning this test can be used) for the duration of the COVID-19 declaration under Section 564(b)(1) of the Act, 21 U.S.C. section 360bbb-3(b)(1), unless the authorization is terminated or revoked.  Performed at [facility], [address] [place], [place] [address]   C Difficile Quick Screen w PCR reflex     Status: None   Collection Time: 05/10/20 10:56 AM   Specimen: STOOL  Result Value Ref Range Status   C Diff antigen NEGATIVE NEGATIVE Final   C Diff toxin NEGATIVE NEGATIVE Final   C Diff interpretation No C. difficile detected.  Final    Comment: Performed at [facility], [address], [place], [place] [address]  Gastrointestinal Panel by PCR , Stool     Status: None   Collection Time: 05/10/20 10:56 AM   Specimen: STOOL  Result Value Ref Range Status   Campylobacter species NOT DETECTED NOT DETECTED Final   Plesimonas shigelloides NOT DETECTED NOT DETECTED Final   Salmonella species NOT DETECTED NOT DETECTED Final   Yersinia enterocolitica NOT DETECTED NOT DETECTED Final   Vibrio species NOT DETECTED NOT DETECTED Final   Vibrio cholerae NOT DETECTED NOT DETECTED Final   Enteroaggregative E coli (EAEC) NOT DETECTED NOT DETECTED  Final   Enteropathogenic E coli (EPEC) NOT DETECTED NOT DETECTED Final   Enterotoxigenic E coli (ETEC) NOT DETECTED NOT DETECTED Final   Shiga like toxin producing E coli (STEC) NOT DETECTED NOT DETECTED Final   Shigella/Enteroinvasive E coli (EIEC) NOT DETECTED NOT DETECTED Final   Cryptosporidium NOT DETECTED NOT DETECTED Final   Cyclospora cayetanensis NOT DETECTED NOT DETECTED Final   Entamoeba histolytica NOT DETECTED NOT DETECTED Final   Giardia lamblia NOT DETECTED NOT DETECTED Final   Adenovirus F40/41 NOT DETECTED NOT DETECTED Final   Astrovirus NOT DETECTED NOT DETECTED Final   Norovirus GI/GII NOT DETECTED NOT DETECTED Final   Rotavirus A NOT DETECTED NOT DETECTED Final   Sapovirus (I, II, IV, and V) NOT DETECTED NOT DETECTED Final    Comment: Performed at [facility], [address] [place],  [address]     Labs: Basic Metabolic Panel: Recent Labs  Lab 05/09/20 1240 05/09/20 2000 05/10/20 0744 05/11/20 0500 05/12/20 0533  NA 125* 127* 129* 130* 134*  K 4.3 4.1 4.2 3.6 4.2  CL 98 101 103 106 107  CO2 20* 20* 18* 18* 20*  GLUCOSE 127* 98 138* 128* 147*  BUN 23 21 15 16 21   CREATININE 0.88 0.67 0.53 0.57 0.59  CALCIUM 8.1* 7.8* 8.0* 7.5* 7.9*  MG  --   --  1.6* 1.7 2.3  PHOS  --   --  2.9 1.7* 3.1   Liver Function Tests: Recent Labs  Lab 05/09/20 1240 05/10/20 0744 05/11/20 0500 05/12/20 0533  AST 34 33 37 32  ALT 20 19 18 19   ALKPHOS 71 59 56 51  BILITOT 0.7 0.8 0.6 0.4  PROT 5.9* 5.3* 5.0* 4.9*  ALBUMIN 3.2* 2.8* 2.8* 2.5*   No results for input(s): LIPASE, AMYLASE in the last 168 hours. No results for input(s): AMMONIA in the last 168 hours. CBC: Recent Labs  Lab 05/09/20 1240 05/10/20 0744 05/11/20 0500 05/12/20 0533  WBC 4.3 1.9* 5.1 2.4*  NEUTROABS 3.7 1.4* 4.5 1.9  HGB 11.6* 10.8* 10.2* 9.2*  HCT 33.4* 31.3* 29.4* 27.0*  MCV 93.0 93.2 93.3 94.4  PLT 114* 105* 121* 101*   Cardiac Enzymes: No results for input(s):  CKTOTAL, CKMB, CKMBINDEX, TROPONINI in  the last 168 hours. BNP: Invalid input(s): POCBNP CBG: Recent Labs  Lab 05/09/20 1203  GLUCAP 128*    Time coordinating discharge:  36 minutes  Signed:  Orson Eva, DO [facility] Pager: [phone] 05/12/2020, 10:31 AM

## 2020-05-12 NOTE — Progress Notes (Signed)
Tele called earlier with report of 16 beat run of SVTs.  Had similar episode this morning and EKG was reviewed by Dr. Carles Collet BP 116/56 pulse 72. SR on monitor.  This information texted to Dr. Orie Rout.

## 2020-05-12 NOTE — TOC Initial Note (Signed)
Transition of Care [facility]) - Initial/Assessment Note    Patient Details  Name: Kristen Mckenzie MRN: [id number] Date of Birth: January 10, 1941  Transition of [facility]) CM/SW Contact:    Salome Arnt, [place] Phone Number: 05/12/2020, 8:59 AM  Clinical Narrative:   Pt admitted with acute hyponatremia due to dehydration and COVID-19 pneumonia. Pt reports she lives alone and is independent with ADLs at baseline. She plans to stay with her daughter, Anderson Malta in [place] when she is discharged. PT evaluated pt yesterday and recommend home health PT. LCSW discussed with pt who requests that LCSW call her daughter since she will be going to her home. LCSW spoke with Anderson Malta and discussed PT recommendations. Anderson Malta states she is a physical therapist and pt would not need any home health services at this time. She confirms plan for pt to stay with her at d/c. No other needs reported.                   Expected Discharge Plan: Home/Self Care Barriers to Discharge: Continued Medical Work up   Patient Goals and CMS Choice Patient states their goals for this hospitalization and ongoing recovery are:: return home   Choice offered to / list presented to : [place]  Expected Discharge Plan and Services Expected Discharge Plan: Home/Self Care In-house Referral: Clinical Social Work     Living arrangements for the past 2 months: Single Family Home                 DME Arranged: N/A         HH Arranged: Refused HH          Prior Living Arrangements/Services Living arrangements for the past 2 months: Single Family Home   Patient language and need for interpreter reviewed:: Yes Do you feel safe going back to the place where you live?: Yes      Need for Family Participation in Patient Care: Yes (Comment) Care giver support system in place?: Yes (comment) Current home services: DME (walker, cane, shower seat, BSC) Criminal Activity/Legal Involvement Pertinent to Current  Situation/Hospitalization: No - Comment as needed  Activities of Daily Living Home Assistive Devices/Equipment: None ADL Screening (condition at time of admission) Patient's cognitive ability adequate to safely complete daily activities?: Yes Is the patient deaf or have difficulty hearing?: No Does the patient have difficulty seeing, even when wearing glasses/contacts?: No Does the patient have difficulty concentrating, remembering, or making decisions?: No Patient able to express need for assistance with ADLs?: Yes Does the patient have difficulty dressing or bathing?: No Independently performs ADLs?: Yes (appropriate for developmental age) Does the patient have difficulty walking or climbing stairs?: No Weakness of Legs: Both Weakness of Arms/Hands: None  Permission Sought/Granted                  Emotional Assessment   Attitude/Demeanor/Rapport: Engaged Affect (typically observed): Accepting Orientation: : Oriented to Self,Oriented to Place,Oriented to  Time,Oriented to Situation Alcohol / Substance Use: Not Applicable Psych Involvement: No (comment)  Admission diagnosis:  Orthostatic hypotension [I95.1] Acute hyponatremia [E87.1] Hyponatremia [E87.1] SVT (supraventricular tachycardia) (HCC) [I47.1] Near syncope [R55] Sepsis ([place]) [A41.9] IPJAS-50 [U07.1] Patient Active Problem List   Diagnosis Date Noted  . Near syncope   . Orthostatic hypotension   . Acute hyponatremia 05/09/2020  . Pneumonia due to COVID-19 virus 05/09/2020  . Dehydration 05/09/2020  . Diarrhea 05/09/2020  . Secondary hypercoagulable state ([place]) 10/18/2019  . Elevated troponin 09/28/2019  .  Chest pain 09/28/2019  . Atrial fibrillation ([place]) 09/19/2019  . History of non-ST elevation myocardial infarction (NSTEMI) 02/07/2019  . Atrial fibrillation with RVR ([place]) 02/06/2019  . 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 Tachycardia 02/28/2017  . Essential hypertension  02/28/2017  . Acute hyperglycemia 02/28/2017  . 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

## 2020-05-12 NOTE — Progress Notes (Signed)
Physical Therapy Treatment Patient Details Name: Kristen Mckenzie MRN: [id number] DOB: [birth year] Today's Date: 05/12/2020    History of Present Illness Kristen Mckenzie is a 80 y.o. female with a history of atrial fibrillation on Eliquis, hypertension, STEMI.  Patient presents with cough over the past 3 weeks.  She been tested for COVID twice at the beginning of her cough, both of which were negative.  She does have positive COVID contacts.  Her physician started her on doxycycline for bronchitis.  Shortly afterwards, she started having diarrhea -multiple episodes a day.  Today, she was in her kitchen when she began to feel weak and feeling like she was going to pass out.  She slowly sat down on the floor.  EMS was called.  The patient was found to be in SVT.  She was given adenosine which converted her heart rate into sinus rhythm with a rate around 100.  She was brought to the hospital for evaluation.    PT Comments    Patient demonstrates increased endurance/distance for ambulation in room while on room air with SpO2 dropping from 92 to 87%, after sitting rest break walked a few more minutes on 2.5 LPM with SpO2 remaining at 94%.  Patient tolerated sitting up in chair after therapy - nursing staff aware.  Patient will benefit from continued physical therapy in hospital and recommended venue below to increase strength, balance, endurance for safe ADLs and gait.    Follow Up Recommendations  Home health PT;Supervision - Intermittent     Equipment Recommendations  None recommended by PT    Recommendations for Other Services       Precautions / Restrictions Precautions Precautions: Fall Restrictions Weight Bearing Restrictions: No    Mobility  Bed Mobility Overal bed mobility: Modified Independent                Transfers Overall transfer level: Modified independent                  Ambulation/Gait   Gait Distance (Feet): 80 Feet Assistive device: IV Pole Gait  Pattern/deviations: Decreased step length - right;Decreased step length - left;Decreased stride length Gait velocity: decreased   General Gait Details: slightly labored cadence pushing IV pole without loss of balance, on room air with SpO2 dropping from 92 to 87%, later tolerated walking with 2 LPM O2 with SpO2 remaining at 94%   Stairs             Wheelchair Mobility    Modified Rankin (Stroke Patients Only)       Balance Overall balance assessment: Needs assistance Sitting-balance support: Feet supported;No upper extremity supported Sitting balance-Leahy Scale: Good Sitting balance - Comments: seated at EOB   Standing balance support: During functional activity;No upper extremity supported Standing balance-Leahy Scale: Fair Standing balance comment: fair/good leaning on IV pole                            Cognition Arousal/Alertness: Awake/alert Behavior During Therapy: WFL for tasks assessed/performed Overall Cognitive Status: Within Functional Limits for tasks assessed                                        Exercises General Exercises - Lower Extremity Ankle Circles/Pumps: Seated;AROM;Strengthening;Both;10 reps Long Arc Quad: Seated;AROM;Strengthening;10 reps;Both Hip Flexion/Marching: Seated;AROM;Strengthening;Both;10 reps    General Comments  Pertinent Vitals/Pain Pain Assessment: No/denies pain    Home Living                      Prior Function            PT Goals (current goals can now be found in the care plan section) Acute Rehab PT Goals Patient Stated Goal: return home with family to assist PT Goal Formulation: With patient Time For Goal Achievement: 05/15/20 Potential to Achieve Goals: Good    Frequency    Min 3X/week      PT Plan      Co-evaluation              AM-PAC PT "6 Clicks" Mobility   Outcome Measure  Help needed turning from your back to your side while in a flat bed  without using bedrails?: None Help needed moving from lying on your back to sitting on the side of a flat bed without using bedrails?: None Help needed moving to and from a bed to a chair (including a wheelchair)?: A Little Help needed standing up from a chair using your arms (e.g., wheelchair or bedside chair)?: A Little Help needed to walk in hospital room?: A Little Help needed climbing 3-5 steps with a railing? : A Little 6 Click Score: 20    End of Session Equipment Utilized During Treatment: Oxygen Activity Tolerance: Patient tolerated treatment well;Patient limited by fatigue Patient left: in chair;Other (comment) Nurse Communication: Mobility status PT Visit Diagnosis: Unsteadiness on feet (R26.81);Other abnormalities of gait and mobility (R26.89);Muscle weakness (generalized) (M62.81)     Time: 7902-4097 PT Time Calculation (min) (ACUTE ONLY): 33 min  Charges:  $Gait Training: 8-22 mins $Therapeutic Exercise: 8-22 mins                     1:45 PM, 05/12/20 Lonell Grandchild, MPT Physical Therapist with [facility] [phone] office [phone] mobile phone

## 2020-05-14 LAB — CULTURE, BLOOD (ROUTINE X 2)
Culture: NO GROWTH
Culture: NO GROWTH
Special Requests: ADEQUATE
Special Requests: ADEQUATE

## 2020-05-15 ENCOUNTER — Telehealth: Payer: Self-pay | Admitting: Cardiology

## 2020-05-15 MED ORDER — VALSARTAN 160 MG PO TABS: 160.0000 mg | ORAL_TABLET | Freq: Every day | ORAL | 6 refills | Status: DC

## 2020-05-15 MED ORDER — FUROSEMIDE 20 MG PO TABS: 20.0000 mg | ORAL_TABLET | Freq: Every day | ORAL | 0 refills | Status: DC

## 2020-05-15 NOTE — Telephone Encounter (Signed)
I reviewed the chart and recent admission.  She received IV fluids during hospital stay and also prednisone, both of which are likely contributing to her leg swelling at this point.  If her blood pressure is staying up now, would resume valsartan at half prior dose.  Also give short course of Lasix 20 mg daily for 3 days.

## 2020-05-15 NOTE — Telephone Encounter (Signed)
Spoke with daughter Anderson Malta) -   Was d/c from [place] on 05/12/2020 - did have covid & now on oxygen at home.  Swelling from knee down to feet x 2 days.  This is definitely new for her.  SOB, but related to her current pneumonia.  No chest pain.  Was also on Prednisone.  Did stop her Valsartan upon hospital discharge.    BP yesterday was 163/71  172/75  - sitting   135 - 171 is typically her BP   60 - 96 is typically her heart rate   Weight today - 127.8lb - Weight in October on office scales 125lb.    BP currently - 155/78  71

## 2020-05-15 NOTE — Telephone Encounter (Signed)
Anderson Malta (sister) notified.  Prescriptions sent [facility] since she is staying with daughter currently.

## 2020-05-15 NOTE — Telephone Encounter (Signed)
Kristen Mckenzie -daughter called stating that patient was d/c 05/12/2020-patient had COVID. Since being discharged she is having a lot of swelling in her feet. Patient has pneumonia.

## 2020-05-21 ENCOUNTER — Ambulatory Visit: Payer: Medicare Other | Admitting: Cardiology

## 2020-05-29 ENCOUNTER — Ambulatory Visit (INDEPENDENT_AMBULATORY_CARE_PROVIDER_SITE_OTHER): Payer: Medicare Other | Admitting: Internal Medicine

## 2020-05-29 ENCOUNTER — Encounter: Payer: Self-pay | Admitting: Internal Medicine

## 2020-05-29 VITALS — BP 106/56 | HR 76 | Ht 64.0 in | Wt 122.0 lb

## 2020-05-29 DIAGNOSIS — D6869 Other thrombophilia: Secondary | ICD-10-CM

## 2020-05-29 DIAGNOSIS — I10 Essential (primary) hypertension: Secondary | ICD-10-CM | POA: Diagnosis not present

## 2020-05-29 DIAGNOSIS — I480 Paroxysmal atrial fibrillation: Secondary | ICD-10-CM

## 2020-05-29 MED ORDER — VALSARTAN 40 MG PO TABS: 40.0000 mg | ORAL_TABLET | Freq: Every day | ORAL | 6 refills | Status: DC

## 2020-05-29 NOTE — Addendum Note (Signed)
Addended by: Laurine Blazer on: 05/29/2020 02:19 PM   Modules accepted: Orders

## 2020-05-29 NOTE — Progress Notes (Signed)
PCP: Caryl Bis, MD Primary Cardiologist: Dr Domenic Polite Primary EP: Dr Rayann Heman  Kristen Mckenzie is a 80 y.o. female who presents today for routine electrophysiology followup.  Since last being seen in our clinic, the patient reports doing reasonably well.  She had COVID with pneumonia and diarrhea in December.  She is on O2 but making slow improvement.  She did have afib with RVR in the setting of acute medical illness though this has improved.  Today, she denies symptoms of palpitations, chest pain,  lower extremity edema, dizziness, presyncope, or syncope.  The patient is otherwise without complaint today.   Past Medical History:  Diagnosis Date  . Anxiety   . Arthritis   . Cholecystolithiasis   . Collagen vascular disease ([place])   . Cystocele with prolapse   . Hypertension   . PAF (paroxysmal atrial fibrillation) ([place])   . Rectocele    Past Surgical History:  Procedure Laterality Date  . ANTERIOR AND POSTERIOR REPAIR N/A 02/21/2018   Procedure: ANTERIOR (CYSTOCELE) REPAIR;  Surgeon: Bjorn Loser, MD;  Location: [facility];  Service: Urology;  Laterality: N/A;  . ATRIAL FIBRILLATION ABLATION N/A 09/19/2019   Procedure: ATRIAL FIBRILLATION ABLATION;  Surgeon: Thompson Grayer, MD;  Location: [place] [facility];  Service: Cardiovascular;  Laterality: N/A;  . CARDIOVERSION N/A 09/29/2019   Procedure: CARDIOVERSION;  Surgeon: Evans Lance, MD;  Location: [place];  Service: Cardiovascular;  Laterality: N/A;  . CYSTOSCOPY N/A 02/21/2018   Procedure: CYSTOSCOPY;  Surgeon: Bjorn Loser, MD;  [facility];  Service: Urology;  Laterality: N/A;  . EYE SURGERY Bilateral 2011   cataract extraction   . LAPAROSCOPIC VAGINAL HYSTERECTOMY WITH SALPINGO OOPHORECTOMY Bilateral 02/21/2018   Procedure: LAPAROSCOPIC ASSISTED VAGINAL HYSTERECTOMY WITH SALPINGO OOPHORECTOMY;  Surgeon: Maisie Fus, MD;  Location: [facility];  Service:  Gynecology;  Laterality: Bilateral;  Dr. Matilde Sprang to follow  . LEFT HEART CATH AND CORONARY ANGIOGRAPHY N/A 02/07/2019   Procedure: LEFT HEART CATH AND CORONARY ANGIOGRAPHY;  Surgeon: Burnell Blanks, MD;  Location: [place] [facility];  Service: Cardiovascular;  Laterality: N/A;    ROS- all systems are reviewed and negatives except as per HPI above  Current Outpatient Medications  Medication Sig Dispense Refill  . acetaminophen (TYLENOL) 325 MG tablet Take 2 tablets (650 mg total) by mouth every 4 (four) hours as needed for headache or mild pain.    Marland Kitchen apixaban (ELIQUIS) 2.5 MG TABS tablet Take 1 tablet (2.5 mg total) by mouth 2 (two) times daily. 60 tablet   . atorvastatin (LIPITOR) 40 MG tablet Take 0.5 tablets (20 mg total) by mouth daily.    . cholecalciferol (VITAMIN D) 1000 units tablet Take 1,000 Units daily by mouth.    . COD LIVER OIL PO Take 1 capsule by mouth every other day.     . metoprolol tartrate (LOPRESSOR) 25 MG tablet Take 0.5 tablets (12.5 mg total) by mouth 2 (two) times daily. 30 tablet 6  . Multiple Vitamin (MULTIVITAMIN WITH MINERALS) TABS tablet Take 1 tablet daily by mouth.    . pantoprazole (PROTONIX) 40 MG tablet Take 40 mg by mouth daily as needed (heartburn). As needed    . Potassium 99 MG TABS Take 99 mg by mouth 3 (three) times a week.     . spironolactone (ALDACTONE) 25 MG tablet Take 1 tablet (25 mg total) by mouth daily. 90 tablet 1  . valsartan (DIOVAN) 160 MG tablet Take  1 tablet (160 mg total) by mouth daily. 30 tablet 6  . vitamin A 10000 UNIT capsule Take 10,000 Units by mouth every other day.     . zinc gluconate 50 MG tablet Take 50 mg by mouth every other day.    . zolpidem (AMBIEN) 5 MG tablet Take 5 mg by mouth at bedtime as needed for sleep.      No current facility-administered medications for this visit.    Physical Exam: Vitals:   05/29/20 1054  BP: (!) 106/56  Pulse: 76  SpO2: 96%  Weight: 122 lb (55.3 kg)  Height: 5\' 4"   (1.626 m)    GEN- The patient is elderly appearing, alert and oriented x 3 today.   Head- normocephalic, atraumatic Eyes-  Sclera clear, conjunctiva pink Ears- hearing intact Oropharynx- clear Lungs- Clear to ausculation bilaterally, normal work of breathing, wearing o2 Heart- Regular rate and rhythm, no murmurs, rubs or gallops, PMI not laterally displaced GI- soft, NT, ND, + BS Extremities- no clubbing, cyanosis, or edema  Wt Readings from Last 3 Encounters:  05/29/20 122 lb (55.3 kg)  05/09/20 120 lb (54.4 kg)  02/18/20 125 lb (56.7 kg)    EKG tracing ordered today is personally reviewed and shows sinus  Assessment and Plan:  1. Paroxysmal atrial fibrillation Doing very well post ablation off AAD therapy chads2vasc scoer is 4.  She is on eliquis  2. HL Continue statin  3. HTN Stable No change required today  4. S/p recent COVID infection She is fortunate to have recovered Weaning O2 No changes today  Risks, benefits and potential toxicities for medications prescribed and/or refilled reviewed with patient today.   Follow-up with Dr Domenic Polite as scheduled in march I will see in a year if no concerns  Thompson Grayer MD, [facility] 05/29/2020 11:00 AM

## 2020-05-29 NOTE — Patient Instructions (Addendum)
Medication Instructions:   Okay to continue the Valsartan at 40mg  daily.   New prescription sent to pharmacy today.   Continue all other medications.    Labwork: none  Testing/Procedures: none  Follow-Up: 1 year   Any Other Special Instructions Will Be Listed Below (If Applicable).  If you need a refill on your cardiac medications before your next appointment, please call your pharmacy.

## 2020-06-04 ENCOUNTER — Telehealth: Payer: Self-pay | Admitting: Cardiology

## 2020-06-04 MED ORDER — SPIRONOLACTONE 25 MG PO TABS: 25.0000 mg | ORAL_TABLET | Freq: Every day | ORAL | 1 refills | Status: DC

## 2020-06-04 NOTE — Telephone Encounter (Signed)
*  STAT* If patient is at the pharmacy, call can be transferred to refill team.   1. Which medications need to be refilled? (please list name of each medication and dose if known) spironolactone (ALDACTONE) 25 MG tablet  2. Which pharmacy/location (including street and city if local pharmacy) is medication to be sent to? [facility] ph# [phone]  3. Do they need a 30 day or 90 day supply? [place]

## 2020-06-04 NOTE — Telephone Encounter (Signed)
[facility]

## 2020-06-05 ENCOUNTER — Telehealth: Payer: Self-pay | Admitting: Cardiology

## 2020-06-05 MED ORDER — METOPROLOL TARTRATE 25 MG PO TABS: 12.5000 mg | ORAL_TABLET | Freq: Two times a day (BID) | ORAL | 3 refills | Status: DC

## 2020-06-05 MED ORDER — APIXABAN 2.5 MG PO TABS: 2.5000 mg | ORAL_TABLET | Freq: Two times a day (BID) | ORAL | 1 refills | Status: DC

## 2020-06-05 NOTE — Telephone Encounter (Signed)
° °  1. Which medications need to be refilled? (please list name of each medication and dose if known)   1. Metoprolol 25 mg 2. Eliquis  2.5 mg   2. Which pharmacy/location (including street and city if local pharmacy) is medication to be sent to?  [facility]   3. Do they need a 30 day or 90 day supply? [address]   Phone # [phone]  Fax #   [phone]   [facility]

## 2020-06-05 NOTE — Telephone Encounter (Signed)
Pt last saw Dr Rayann Heman 05/29/20, last labs 05/12/20 Creat 0.59, age 79, weight 55.3kg, based on specified criteria pt is on appropriate dosage of Eliquis 2.5mg  BID for afib.  Will refill rx.

## 2020-06-05 NOTE — Telephone Encounter (Signed)
Metoprolol refilled.

## 2020-06-22 DIAGNOSIS — E7849 Other hyperlipidemia: Secondary | ICD-10-CM | POA: Diagnosis not present

## 2020-06-22 DIAGNOSIS — I10 Essential (primary) hypertension: Secondary | ICD-10-CM | POA: Diagnosis not present

## 2020-06-22 DIAGNOSIS — I480 Paroxysmal atrial fibrillation: Secondary | ICD-10-CM | POA: Diagnosis not present

## 2020-06-22 DIAGNOSIS — K219 Gastro-esophageal reflux disease without esophagitis: Secondary | ICD-10-CM | POA: Diagnosis not present

## 2020-06-25 ENCOUNTER — Ambulatory Visit (INDEPENDENT_AMBULATORY_CARE_PROVIDER_SITE_OTHER): Payer: Medicare Other | Admitting: Cardiology

## 2020-06-25 ENCOUNTER — Encounter: Payer: Self-pay | Admitting: *Deleted

## 2020-06-25 ENCOUNTER — Encounter: Payer: Self-pay | Admitting: Cardiology

## 2020-06-25 VITALS — BP 118/60 | HR 75 | Ht 64.0 in | Wt 124.0 lb

## 2020-06-25 DIAGNOSIS — I10 Essential (primary) hypertension: Secondary | ICD-10-CM | POA: Diagnosis not present

## 2020-06-25 DIAGNOSIS — I480 Paroxysmal atrial fibrillation: Secondary | ICD-10-CM | POA: Diagnosis not present

## 2020-06-25 NOTE — Patient Instructions (Addendum)
Medication Instructions:   Your physician recommends that you continue on your current medications as directed. Please refer to the Current Medication list given to you today.  Labwork:  none  Testing/Procedures:  none  Follow-Up:  Your physician recommends that you schedule a follow-up appointment in: 3 months.  Any Other Special Instructions Will Be Listed Below (If Applicable).  Orders sent to [place] to Discontinue your home oxygen therapy.  If you need a refill on your cardiac medications before your next appointment, please call your pharmacy.

## 2020-06-25 NOTE — Progress Notes (Signed)
Cardiology Office Note  Date: 06/25/2020   ID: Kristen Mckenzie, Kristen Mckenzie July 11, 1940, MRN [id number]  PCP:  Caryl Bis, MD  Cardiologist:  Rozann Lesches, MD Electrophysiologist:  Thompson Grayer, MD   Chief Complaint  Patient presents with  . Cardiac follow-up    History of Present Illness: Kristen Mckenzie is an 80 y.o. female last seen in October 2021.  She is here today with family member for follow-up.  She was hospitalized in January with IOEVO-35 pneumonia complicated by diarrhea.  She has been staying with family since discharge recuperating, doing well at this point.  Appetite is good.  She reports occasional palpitations, occasional dizziness.  No chest pain or syncope.  We went over her home blood pressure measurements.  Antihypertensives have been cut back.  For now we have decided to continue with observation on the current regimen.  I personally reviewed her ECG which shows normal sinus rhythm with low voltage.  Follow-up visit noted with Dr. Rayann Heman in February.  I reviewed the note.  We had her ambulate in the hall with pulse oximeter showing no significant desaturations.  Past Medical History:  Diagnosis Date  . Anxiety   . Arthritis   . Cholecystolithiasis   . Collagen vascular disease ([place])   . Cystocele with prolapse   . Hypertension   . PAF (paroxysmal atrial fibrillation) ([place])   . Rectocele     Past Surgical History:  Procedure Laterality Date  . ANTERIOR AND POSTERIOR REPAIR N/A 02/21/2018   Procedure: ANTERIOR (CYSTOCELE) REPAIR;  Surgeon: Bjorn Loser, MD;  Location: [facility];  Service: Urology;  Laterality: N/A;  . ATRIAL FIBRILLATION ABLATION N/A 09/19/2019   Procedure: ATRIAL FIBRILLATION ABLATION;  Surgeon: Thompson Grayer, MD;  Location: [place] [facility];  Service: Cardiovascular;  Laterality: N/A;  . CARDIOVERSION N/A 09/29/2019   Procedure: CARDIOVERSION;  Surgeon: Evans Lance, MD;  Location: [facility];  Service:  Cardiovascular;  Laterality: N/A;  . CYSTOSCOPY N/A 02/21/2018   Procedure: CYSTOSCOPY;  Surgeon: Bjorn Loser, MD;  Location[facility];  Service: Urology;  Laterality: N/A;  . EYE SURGERY Bilateral 2011   cataract extraction   . LAPAROSCOPIC VAGINAL HYSTERECTOMY WITH SALPINGO OOPHORECTOMY Bilateral 02/21/2018   Procedure: LAPAROSCOPIC ASSISTED VAGINAL HYSTERECTOMY WITH SALPINGO OOPHORECTOMY;  Surgeon: Maisie Fus, MD;  [facility]: [facility];  Service: Gynecology;  Laterality: Bilateral;  Dr. Matilde Sprang to follow  . LEFT HEART CATH AND CORONARY ANGIOGRAPHY N/A 02/07/2019   Procedure: LEFT HEART CATH AND CORONARY ANGIOGRAPHY;  Surgeon: Burnell Blanks, MD;  Location: [facility];  Service: Cardiovascular;  Laterality: N/A;    Current Outpatient Medications  Medication Sig Dispense Refill  . acetaminophen (TYLENOL) 325 MG tablet Take 2 tablets (650 mg total) by mouth every 4 (four) hours as needed for headache or mild pain.    Marland Kitchen apixaban (ELIQUIS) 2.5 MG TABS tablet Take 1 tablet (2.5 mg total) by mouth 2 (two) times daily. 180 tablet 1  . atorvastatin (LIPITOR) 40 MG tablet Take 0.5 tablets (20 mg total) by mouth daily.    . cholecalciferol (VITAMIN D) 1000 units tablet Take 1,000 Units daily by mouth.    . COD LIVER OIL PO Take 1 capsule by mouth every other day.     . metoprolol tartrate (LOPRESSOR) 25 MG tablet Take 0.5 tablets (12.5 mg total) by mouth 2 (two) times daily. 90 tablet 3  . Multiple Vitamin (MULTIVITAMIN WITH MINERALS) TABS  tablet Take 1 tablet daily by mouth.    . pantoprazole (PROTONIX) 40 MG tablet Take 40 mg by mouth daily as needed (heartburn). As needed    . Potassium 99 MG TABS Take 99 mg by mouth 3 (three) times a week.     . spironolactone (ALDACTONE) 25 MG tablet Take 1 tablet (25 mg total) by mouth daily. 90 tablet 1  . valsartan (DIOVAN) 40 MG tablet Take 1 tablet (40 mg total) by mouth daily. 30 tablet 6  .  zolpidem (AMBIEN) 5 MG tablet Take 5 mg by mouth at bedtime as needed for sleep.      No current facility-administered medications for this visit.   Allergies:  Phenobarbital, Tamiflu [oseltamivir phosphate], Depakote [valproic acid], and Lisinopril   ROS: No syncope.  Physical Exam: VS:  BP 118/60   Pulse 75   Ht 5\' 4"  (1.626 m)   Wt 124 lb (56.2 kg)   SpO2 94% Comment: Resting after walking at 3:36 pm  BMI 21.28 kg/m , BMI Body mass index is 21.28 kg/m.  Wt Readings from Last 3 Encounters:  06/25/20 124 lb (56.2 kg)  05/29/20 122 lb (55.3 kg)  05/09/20 120 lb (54.4 kg)    General: Elderly woman, appears comfortable at rest. HEENT: Conjunctiva and lids normal, wearing a mask. Neck: Supple, no elevated JVP or carotid bruits, no thyromegaly. Lungs: Clear to auscultation, nonlabored breathing at rest. Cardiac: Regular rate and rhythm, no S3, 2/6 systolic murmur, no pericardial rub. Extremities: No pitting edema.  ECG:  An ECG dated 05/29/2020 was personally reviewed today and demonstrated:  Sinus rhythm with early repolarization.  Recent Labwork: 05/11/2020: TSH 0.012 05/12/2020: ALT 19; AST 32; BUN 21; Creatinine, Ser 0.59; Hemoglobin 9.2; Magnesium 2.3; Platelets 101; Potassium 4.2; Sodium 134   Other Studies Reviewed Today:  Cardiac catheterization 02/07/2019:  The left ventricular systolic function is normal.  LV end diastolic pressure is normal.  The left ventricular ejection fraction is greater than 65% by visual estimate.  There is no mitral valve regurgitation.  1. No angiographic evidence of CAD 2. Normal LV systolic function' 3. Elevation in troponin likely due to demand ischemia in setting of rapid atrial fibrillation  Echocardiogram 01/16/2019: 1. Left ventricular ejection fraction, by visual estimation, is 70 to  75%. The left ventricle has hyperdynamic function. Normal left ventricular  posterior wall thickness. There is no left ventricular  hypertrophy.  2. Elevated mean left atrial pressure.  3. Left ventricular diastolic Doppler parameters are consistent with  impaired relaxation pattern of LV diastolic filling.  4. There is focal basal septal LV hypertrophy. In the setting of focal  basal septal hypertrophy and hyperdynamic LV function there is SAM of the  anterior mitral valve leaflet and a dynamic LVOT gradient with a peak of  32 mmHg.  5. Global right ventricle has normal systolic function.The right  ventricular size is normal. No increase in right ventricular wall  thickness.  6. Left atrial size was mildly dilated.  7. Right atrial size was normal.  8. Moderate mitral annular calcification.  9. The mitral valve is abnormal. Mild mitral valve regurgitation. No  evidence of mitral stenosis.  10. The tricuspid valve is normal in structure. Tricuspid valve  regurgitation is mild.  11. The aortic valve is tricuspid Aortic valve regurgitation was not  visualized by color flow Doppler. Structurally normal aortic valve, with  no evidence of sclerosis or stenosis.  12. The pulmonic valve was not well visualized. Pulmonic valve  regurgitation is not visualized by color flow Doppler.  13. Mildly elevated pulmonary artery systolic pressure.  14. PASP is borderline elevated at 30 mmHg.   Assessment and Plan:  1.  Paroxysmal atrial fibrillation status post atrial fibrillation ablation.  CHA2DS2-VASc score is 4.  She continues to follow with Dr. Rayann Heman and remains on Eliquis for stroke prophylaxis.  Tolerating low-dose Lopressor with only intermittent sense of brief palpitations.  She is in sinus rhythm by ECG today.  We will continue with observation for now.  2.  Essential hypertension, no change to current regimen for now.  Diovan is at 40 mg daily along with Aldactone 25 mg daily.  She will continue to track measurements at home.  3.  Patient with history of COVID-19 pneumonia in January.  She was discharged by  hospitalist on supplemental oxygen, no longer using it and not hypoxic with ambulation today.  Home oxygen will be discontinued.  Medication Adjustments/Labs and Tests Ordered: Current medicines are reviewed at length with the patient today.  Concerns regarding medicines are outlined above.   Tests Ordered: Orders Placed This Encounter  Procedures  . EKG 12-Lead    Medication Changes: No orders of the defined types were placed in this encounter.   Disposition:  Follow up 3 months in the [place] office.  Signed, Satira Sark, MD, [facility] 06/25/2020 3:49 PM    [facility] [place], [place], [place] [address] Phone: [phone]; Fax: [phone]

## 2020-06-29 ENCOUNTER — Telehealth: Payer: Self-pay | Admitting: Cardiology

## 2020-06-29 NOTE — Telephone Encounter (Signed)
New message   Needs letter sent over to [employer] to pick up oxygen they are stating they did not receive the letter that was sent on 06/25/20 , please resend letter to [phone]   Thank you

## 2020-06-29 NOTE — Telephone Encounter (Signed)
Letter re-sent

## 2020-07-09 ENCOUNTER — Other Ambulatory Visit: Payer: Self-pay

## 2020-07-09 MED ORDER — VALSARTAN 40 MG PO TABS: 40.0000 mg | ORAL_TABLET | Freq: Every day | ORAL | 5 refills | Status: DC

## 2020-07-09 NOTE — Telephone Encounter (Signed)
This is a [place] pt °

## 2020-07-13 ENCOUNTER — Other Ambulatory Visit: Payer: Self-pay | Admitting: *Deleted

## 2020-07-13 MED ORDER — VALSARTAN 40 MG PO TABS: 40.0000 mg | ORAL_TABLET | Freq: Every day | ORAL | 3 refills | Status: DC

## 2020-07-30 DIAGNOSIS — K2100 Gastro-esophageal reflux disease with esophagitis, without bleeding: Secondary | ICD-10-CM | POA: Diagnosis not present

## 2020-07-30 DIAGNOSIS — I10 Essential (primary) hypertension: Secondary | ICD-10-CM | POA: Diagnosis not present

## 2020-07-30 DIAGNOSIS — E0590 Thyrotoxicosis, unspecified without thyrotoxic crisis or storm: Secondary | ICD-10-CM | POA: Diagnosis not present

## 2020-07-30 DIAGNOSIS — E7849 Other hyperlipidemia: Secondary | ICD-10-CM | POA: Diagnosis not present

## 2020-07-30 DIAGNOSIS — E782 Mixed hyperlipidemia: Secondary | ICD-10-CM | POA: Diagnosis not present

## 2020-08-11 DIAGNOSIS — Z6822 Body mass index (BMI) 22.0-22.9, adult: Secondary | ICD-10-CM | POA: Diagnosis not present

## 2020-08-11 DIAGNOSIS — R002 Palpitations: Secondary | ICD-10-CM | POA: Diagnosis not present

## 2020-08-11 DIAGNOSIS — I10 Essential (primary) hypertension: Secondary | ICD-10-CM | POA: Diagnosis not present

## 2020-08-11 DIAGNOSIS — I700 Atherosclerosis of aorta: Secondary | ICD-10-CM | POA: Diagnosis not present

## 2020-08-11 DIAGNOSIS — E7849 Other hyperlipidemia: Secondary | ICD-10-CM | POA: Diagnosis not present

## 2020-08-11 DIAGNOSIS — I480 Paroxysmal atrial fibrillation: Secondary | ICD-10-CM | POA: Diagnosis not present

## 2020-08-11 DIAGNOSIS — F331 Major depressive disorder, recurrent, moderate: Secondary | ICD-10-CM | POA: Diagnosis not present

## 2020-09-21 ENCOUNTER — Telehealth: Payer: Self-pay | Admitting: Internal Medicine

## 2020-09-21 NOTE — Telephone Encounter (Signed)
Received call from patient and her daughter stating that Ms. Mauger's heart rates have been persistently elevated in the 140-160s range for the past several ways. Symptomatically, she can feel her heart racing but other than that she denies any associated chest pain, exertional chest pressure/discomfort, dyspnea/tachypnea, orthopnea, or presyncope/syncope. I had her take her vitals while on the phone and her BP was 102/61 with a HR of 140.   She has a history of AF s/p ablation and I suspect recurrence of this. I instructed her to take another dose of her Metoprolol 12.5mg  now and recheck her HR in 1-2 hours. If her HR is still elevated, they are to call back for further guidance which will likely include recommendations to come to the ED. Patient and her daughter voiced understanding. Anticipatory guidance and ED precautions given, as well.

## 2020-09-22 ENCOUNTER — Telehealth: Payer: Self-pay | Admitting: Cardiology

## 2020-09-22 NOTE — Telephone Encounter (Signed)
It sounds like she did potentially have a breakthrough episode of atrial fibrillation based on heart rates reported to earlier on call provider.  If heart rate is down in the 80s, she has likely converted back to sinus rhythm.  Let's continue with current medications for now unless symptoms escalate.

## 2020-09-22 NOTE — Telephone Encounter (Signed)
Pt voiced understanding

## 2020-09-22 NOTE — Telephone Encounter (Signed)
Last night spoke with on call Doctor. Please see note in chart.   8am this morning BP 89/66 HR 100. No symptoms. Please advise.

## 2020-09-22 NOTE — Telephone Encounter (Signed)
Pt took extra 12.5 mg of metoprolol at 1130pm last night as per instructions of on call provider (see phone note) pt denies any symptoms at this time just says she can feel her heart beating fast - just took BP 110/60 HR 82 and took morning medications 2 hours ago

## 2020-09-23 DIAGNOSIS — H35363 Drusen (degenerative) of macula, bilateral: Secondary | ICD-10-CM | POA: Diagnosis not present

## 2020-09-28 NOTE — Progress Notes (Signed)
Cardiology Office Note  Date: 09/29/2020   ID: Taneil, Lazarus [birth year], MRN [id number]  PCP:  Caryl Bis, MD  Cardiologist:  Rozann Lesches, MD Electrophysiologist:  Thompson Grayer, MD   Chief Complaint  Patient presents with  . Cardiac follow-up    History of Present Illness: Kristen Mckenzie is an 80 y.o. female last seen in March.  She is here for a follow-up visit.  She did have a breakthrough of suspected atrial fibrillation around Labor Day, this improved after taking an additional Lopressor 12.5 mg tablet.  I personally reviewed her ECG today which shows normal sinus rhythm with low voltage.  We went over her medications which are otherwise unchanged.  She does not report any spontaneous bleeding problems on Eliquis.  She continues to follow regularly with [facility].  Past Medical History:  Diagnosis Date  . Anxiety   . Arthritis   . Cholecystolithiasis   . Collagen vascular disease ([place])   . Cystocele with prolapse   . Hypertension   . PAF (paroxysmal atrial fibrillation) ([place])   . Rectocele     Past Surgical History:  Procedure Laterality Date  . ANTERIOR AND POSTERIOR REPAIR N/A 02/21/2018   Procedure: ANTERIOR (CYSTOCELE) REPAIR;  Surgeon: Bjorn Loser, MD;  Location: [facility];  Service: Urology;  Laterality: N/A;  . ATRIAL FIBRILLATION ABLATION N/A 09/19/2019   Procedure: ATRIAL FIBRILLATION ABLATION;  Surgeon: Thompson Grayer, MD;  Location: [facility];  Service: Cardiovascular;  Laterality: N/A;  . CARDIOVERSION N/A 09/29/2019   Procedure: CARDIOVERSION;  Surgeon: Evans Lance, MD;  Location: [facility];  Service: Cardiovascular;  Laterality: N/A;  . CYSTOSCOPY N/A 02/21/2018   Procedure: CYSTOSCOPY;  Surgeon: Bjorn Loser, MD;  Location[facility];  Service: Urology;  Laterality: N/A;  . EYE SURGERY Bilateral 2011   cataract extraction   . LAPAROSCOPIC VAGINAL HYSTERECTOMY WITH SALPINGO  OOPHORECTOMY Bilateral 02/21/2018   Procedure: LAPAROSCOPIC ASSISTED VAGINAL HYSTERECTOMY WITH SALPINGO OOPHORECTOMY;  Surgeon: Maisie Fus, MD;  Location: [facility];  Service: Gynecology;  Laterality: Bilateral;  Dr. Matilde Sprang to follow  . LEFT HEART CATH AND CORONARY ANGIOGRAPHY N/A 02/07/2019   Procedure: LEFT HEART CATH AND CORONARY ANGIOGRAPHY;  Surgeon: Burnell Blanks, MD;  Location: [facility];  Service: Cardiovascular;  Laterality: N/A;    Current Outpatient Medications  Medication Sig Dispense Refill  . acetaminophen (TYLENOL) 325 MG tablet Take 2 tablets (650 mg total) by mouth every 4 (four) hours as needed for headache or mild pain.    Marland Kitchen apixaban (ELIQUIS) 2.5 MG TABS tablet Take 1 tablet (2.5 mg total) by mouth 2 (two) times daily. 180 tablet 1  . atorvastatin (LIPITOR) 40 MG tablet Take 0.5 tablets (20 mg total) by mouth daily.    . cholecalciferol (VITAMIN D) 1000 units tablet Take 1,000 Units daily by mouth.    . COD LIVER OIL PO Take 1 capsule by mouth every other day.     . metoprolol tartrate (LOPRESSOR) 25 MG tablet Take 0.5 tablets (12.5 mg total) by mouth 2 (two) times daily. 90 tablet 3  . Multiple Vitamin (MULTIVITAMIN WITH MINERALS) TABS tablet Take 1 tablet daily by mouth.    . pantoprazole (PROTONIX) 40 MG tablet Take 40 mg by mouth daily as needed (heartburn). As needed    . Potassium 99 MG TABS Take 99 mg by mouth 3 (three) times a week.     . valsartan (DIOVAN)  40 MG tablet Take 1 tablet (40 mg total) by mouth daily. 90 tablet 3  . zolpidem (AMBIEN) 5 MG tablet Take 5 mg by mouth at bedtime as needed for sleep.     Marland Kitchen spironolactone (ALDACTONE) 25 MG tablet Take 1 tablet (25 mg total) by mouth daily. 90 tablet 1   No current facility-administered medications for this visit.   Allergies:  Phenobarbital, Tamiflu [oseltamivir phosphate], Depakote [valproic acid], and Lisinopril   ROS: No dizziness or syncope.  Physical  Exam: VS:  BP 120/66   Pulse 78   Ht 5\' 4"  (1.626 m)   Wt 127 lb (57.6 kg)   SpO2 97%   BMI 21.80 kg/m , BMI Body mass index is 21.8 kg/m.  Wt Readings from Last 3 Encounters:  09/29/20 127 lb (57.6 kg)  06/25/20 124 lb (56.2 kg)  05/29/20 122 lb (55.3 kg)    General: Patient appears comfortable at rest. HEENT: Conjunctiva and lids normal, wearing a mask. Neck: Supple, no elevated JVP or carotid bruits, no thyromegaly. Lungs: Clear to auscultation, nonlabored breathing at rest. Cardiac: Regular rate and rhythm, no S3, 2/6 systolic murmur, no pericardial rub. Extremities: No pitting edema.  ECG:  An ECG dated 06/25/2020 was personally reviewed today and demonstrated:  Sinus rhythm with low voltage.  Recent Labwork: 05/11/2020: TSH 0.012 05/12/2020: ALT 19; AST 32; BUN 21; Creatinine, Ser 0.59; Hemoglobin 9.2; Magnesium 2.3; Platelets 101; Potassium 4.2; Sodium 134   Other Studies Reviewed Today:  Cardiac catheterization 02/07/2019:  The left ventricular systolic function is normal.  LV end diastolic pressure is normal.  The left ventricular ejection fraction is greater than 65% by visual estimate.  There is no mitral valve regurgitation.  1. No angiographic evidence of CAD 2. Normal LV systolic function' 3. Elevation in troponin likely due to demand ischemia in setting of rapid atrial fibrillation  Echocardiogram 01/16/2019: 1. Left ventricular ejection fraction, by visual estimation, is 70 to  75%. The left ventricle has hyperdynamic function. Normal left ventricular  posterior wall thickness. There is no left ventricular hypertrophy.  2. Elevated mean left atrial pressure.  3. Left ventricular diastolic Doppler parameters are consistent with  impaired relaxation pattern of LV diastolic filling.  4. There is focal basal septal LV hypertrophy. In the setting of focal  basal septal hypertrophy and hyperdynamic LV function there is SAM of the  anterior mitral valve  leaflet and a dynamic LVOT gradient with a peak of  32 mmHg.  5. Global right ventricle has normal systolic function.The right  ventricular size is normal. No increase in right ventricular wall  thickness.  6. Left atrial size was mildly dilated.  7. Right atrial size was normal.  8. Moderate mitral annular calcification.  9. The mitral valve is abnormal. Mild mitral valve regurgitation. No  evidence of mitral stenosis.  10. The tricuspid valve is normal in structure. Tricuspid valve  regurgitation is mild.  11. The aortic valve is tricuspid Aortic valve regurgitation was not  visualized by color flow Doppler. Structurally normal aortic valve, with  no evidence of sclerosis or stenosis.  12. The pulmonic valve was not well visualized. Pulmonic valve  regurgitation is not visualized by color flow Doppler.  13. Mildly elevated pulmonary artery systolic pressure.  14. PASP is borderline elevated at 30 mmHg.   Assessment and Plan:  1.  Paroxysmal atrial fibrillation with CHA2DS2-VASc score of 4.  She did have a recent breakthrough episode but is in sinus rhythm today.  Continue low-dose Lopressor along with Eliquis for stroke prophylaxis.  She can use an extra half dose Lopressor as needed for palpitations.  2.  Acquired thrombophilia, on Eliquis for stroke prophylaxis.  No reported spontaneous bleeding problems.  Continues to follow lab work with [facility].  2.  Essential hypertension, blood pressure is well controlled today.  Continue Diovan and Aldactone, we discussed not taking these both at the same time.  Medication Adjustments/Labs and Tests Ordered: Current medicines are reviewed at length with the patient today.  Concerns regarding medicines are outlined above.   Tests Ordered: No orders of the defined types were placed in this encounter.   Medication Changes: No orders of the defined types were placed in this encounter.   Disposition:  Follow up 3  months.  Signed, Satira Sark, MD, [facility] 09/29/2020 11:18 AM    [facility] [place], [place], [place] [address] Phone: [phone]; Fax: [phone]

## 2020-09-29 ENCOUNTER — Ambulatory Visit (INDEPENDENT_AMBULATORY_CARE_PROVIDER_SITE_OTHER): Payer: Medicare Other | Admitting: Cardiology

## 2020-09-29 ENCOUNTER — Encounter: Payer: Self-pay | Admitting: Cardiology

## 2020-09-29 VITALS — BP 120/66 | HR 78 | Ht 64.0 in | Wt 127.0 lb

## 2020-09-29 DIAGNOSIS — D6869 Other thrombophilia: Secondary | ICD-10-CM

## 2020-09-29 DIAGNOSIS — I480 Paroxysmal atrial fibrillation: Secondary | ICD-10-CM

## 2020-09-29 DIAGNOSIS — I10 Essential (primary) hypertension: Secondary | ICD-10-CM | POA: Diagnosis not present

## 2020-09-29 NOTE — Patient Instructions (Addendum)
Medication Instructions:   Your physician recommends that you continue on your current medications as directed. Please refer to the Current Medication list given to you today.  Labwork:  none  Testing/Procedures:  none  Follow-Up:  Your physician recommends that you schedule a follow-up appointment in: 3 months.  Any Other Special Instructions Will Be Listed Below (If Applicable).  If you need a refill on your cardiac medications before your next appointment, please call your pharmacy. 

## 2020-09-29 NOTE — Addendum Note (Signed)
Addended by: Merlene Laughter on: 09/29/2020 04:32 PM   Modules accepted: Orders

## 2020-11-22 DIAGNOSIS — I480 Paroxysmal atrial fibrillation: Secondary | ICD-10-CM | POA: Diagnosis not present

## 2020-11-22 DIAGNOSIS — K219 Gastro-esophageal reflux disease without esophagitis: Secondary | ICD-10-CM | POA: Diagnosis not present

## 2020-11-22 DIAGNOSIS — E7849 Other hyperlipidemia: Secondary | ICD-10-CM | POA: Diagnosis not present

## 2020-11-22 DIAGNOSIS — I10 Essential (primary) hypertension: Secondary | ICD-10-CM | POA: Diagnosis not present

## 2020-12-03 DIAGNOSIS — E782 Mixed hyperlipidemia: Secondary | ICD-10-CM | POA: Diagnosis not present

## 2020-12-03 DIAGNOSIS — K2100 Gastro-esophageal reflux disease with esophagitis, without bleeding: Secondary | ICD-10-CM | POA: Diagnosis not present

## 2020-12-03 DIAGNOSIS — I10 Essential (primary) hypertension: Secondary | ICD-10-CM | POA: Diagnosis not present

## 2020-12-03 DIAGNOSIS — E7849 Other hyperlipidemia: Secondary | ICD-10-CM | POA: Diagnosis not present

## 2020-12-10 DIAGNOSIS — E7849 Other hyperlipidemia: Secondary | ICD-10-CM | POA: Diagnosis not present

## 2020-12-10 DIAGNOSIS — I480 Paroxysmal atrial fibrillation: Secondary | ICD-10-CM | POA: Diagnosis not present

## 2020-12-10 DIAGNOSIS — F331 Major depressive disorder, recurrent, moderate: Secondary | ICD-10-CM | POA: Diagnosis not present

## 2020-12-10 DIAGNOSIS — R002 Palpitations: Secondary | ICD-10-CM | POA: Diagnosis not present

## 2020-12-10 DIAGNOSIS — Z6822 Body mass index (BMI) 22.0-22.9, adult: Secondary | ICD-10-CM | POA: Diagnosis not present

## 2020-12-10 DIAGNOSIS — R4582 Worries: Secondary | ICD-10-CM | POA: Diagnosis not present

## 2020-12-10 DIAGNOSIS — I10 Essential (primary) hypertension: Secondary | ICD-10-CM | POA: Diagnosis not present

## 2020-12-10 DIAGNOSIS — I700 Atherosclerosis of aorta: Secondary | ICD-10-CM | POA: Diagnosis not present

## 2021-01-19 ENCOUNTER — Other Ambulatory Visit: Payer: Self-pay | Admitting: *Deleted

## 2021-01-19 MED ORDER — APIXABAN 2.5 MG PO TABS: 2.5000 mg | ORAL_TABLET | Freq: Two times a day (BID) | ORAL | 3 refills | Status: DC

## 2021-01-22 DIAGNOSIS — E7849 Other hyperlipidemia: Secondary | ICD-10-CM | POA: Diagnosis not present

## 2021-01-22 DIAGNOSIS — I480 Paroxysmal atrial fibrillation: Secondary | ICD-10-CM | POA: Diagnosis not present

## 2021-01-22 DIAGNOSIS — I10 Essential (primary) hypertension: Secondary | ICD-10-CM | POA: Diagnosis not present

## 2021-01-22 DIAGNOSIS — K219 Gastro-esophageal reflux disease without esophagitis: Secondary | ICD-10-CM | POA: Diagnosis not present

## 2021-02-03 NOTE — Progress Notes (Signed)
Cardiology Office Note  Date: 02/04/2021   ID: Kristen, Mckenzie 01-Jul-1940, MRN [id number]  PCP:  Caryl Bis, MD  Cardiologist:  Rozann Lesches, MD Electrophysiologist:  Thompson Grayer, MD   Chief Complaint: 72-month follow-up  History of Present Illness: Kristen Mckenzie is a 80 y.o. female with a history of HTN, atrial fibrillation, orthostatic hypotension, near syncope.  Last saw Dr. Domenic Polite on September 29, 2020 for follow-up.  She did have suspected atrial fibrillation around Labor Day.  This improved after taking an additional Lopressor 12.5 mg.  He reviewed her EKG which showed normal sinus rhythm.  She did not report any spontaneous bleeding on Eliquis.  She continues to follow regularly at [facility].  CHA2DS2-VASc score was 4.  She was continuing low-dose Lopressor with Eliquis for stroke prophylaxis.  Could continue taking an extra half dose of Lopressor as needed for palpitations.  She had no reported bleeding problems on Eliquis.  Blood pressure was well controlled.  She was continuing Diovan and Aldactone.  Discussion took place about not taking both medications at the same time.  She is here for 23-month follow-up.  She denies any recent issues with palpitations, orthostatic hypotension or near syncope.  She states she had to cut her valsartan dosage in half due to low blood pressures.  She states home blood pressures have been in the 956L to 875I systolic and 43P diastolic typically.  Currently only taking valsartan 20 mg daily.  She continues to take her spironolactone 12.5 mg daily.  She continues metoprolol 12.5 mg p.o. twice daily.  She continues Eliquis 2.5 mg p.o. twice daily.  EKG today shows normal sinus rhythm with a rate of 74.  Denies any anginal symptoms, DOE or SOB, PND, orthopnea, bleeding.  No lower extremity edema.   Past Medical History:  Diagnosis Date   Anxiety    Arthritis    Cholecystolithiasis    Collagen vascular disease ([place])    Cystocele with  prolapse    Hypertension    PAF (paroxysmal atrial fibrillation) ([place])    Rectocele     Past Surgical History:  Procedure Laterality Date   ANTERIOR AND POSTERIOR REPAIR N/A 02/21/2018   Procedure: ANTERIOR (CYSTOCELE) REPAIR;  Surgeon: Bjorn Loser, MD;  Location: [facility];  Service: Urology;  Laterality: N/A;   ATRIAL FIBRILLATION ABLATION N/A 09/19/2019   Procedure: ATRIAL FIBRILLATION ABLATION;  Surgeon: Thompson Grayer, MD;  Location: [facility];  Service: Cardiovascular;  Laterality: N/A;   CARDIOVERSION N/A 09/29/2019   Procedure: CARDIOVERSION;  Surgeon: Evans Lance, MD;  Location: [facility];  Service: Cardiovascular;  Laterality: N/A;   CYSTOSCOPY N/A 02/21/2018   Procedure: CYSTOSCOPY;  Surgeon: Bjorn Loser, MD;  Location: [facility];  Service: Urology;  Laterality: N/A;   EYE SURGERY Bilateral 2011   cataract extraction    LAPAROSCOPIC VAGINAL HYSTERECTOMY WITH SALPINGO OOPHORECTOMY Bilateral 02/21/2018   Procedure: LAPAROSCOPIC ASSISTED VAGINAL HYSTERECTOMY WITH SALPINGO OOPHORECTOMY;  Surgeon: Maisie Fus, MD;  Location: [facility];  Service: Gynecology;  Laterality: Bilateral;  Dr. Matilde Sprang to follow   LEFT HEART CATH AND CORONARY ANGIOGRAPHY N/A 02/07/2019   Procedure: LEFT HEART CATH AND CORONARY ANGIOGRAPHY;  Surgeon: Burnell Blanks, MD;  Location: [facility];  Service: Cardiovascular;  Laterality: N/A;    Current Outpatient Medications  Medication Sig Dispense Refill   acetaminophen (TYLENOL) 325 MG tablet Take 2 tablets (650 mg total) by mouth every 4 (  four) hours as needed for headache or mild pain.     cholecalciferol (VITAMIN D) 1000 units tablet Take 1,000 Units daily by mouth.     COD LIVER OIL PO Take 1 capsule by mouth every other day.      Multiple Vitamin (MULTIVITAMIN WITH MINERALS) TABS tablet Take 1 tablet daily by mouth.     pantoprazole (PROTONIX) 40 MG tablet Take 40  mg by mouth daily as needed (heartburn). As needed     Potassium 99 MG TABS Take 99 mg by mouth 3 (three) times a week.      valsartan (DIOVAN) 40 MG tablet Take 0.5 tablets (20 mg total) by mouth daily. 45 tablet 3   zolpidem (AMBIEN) 5 MG tablet Take 5 mg by mouth at bedtime as needed for sleep.      apixaban (ELIQUIS) 2.5 MG TABS tablet Take 1 tablet (2.5 mg total) by mouth 2 (two) times daily. 180 tablet 3   atorvastatin (LIPITOR) 40 MG tablet Take 0.5 tablets (20 mg total) by mouth daily. (Patient not taking: Reported on 02/04/2021)     metoprolol tartrate (LOPRESSOR) 25 MG tablet Take 0.5 tablets (12.5 mg total) by mouth 2 (two) times daily. 90 tablet 3   spironolactone (ALDACTONE) 25 MG tablet Take 1 tablet (25 mg total) by mouth daily. (Patient taking differently: Take 12.5 mg by mouth daily.) 90 tablet 1   No current facility-administered medications for this visit.   Allergies:  Phenobarbital, Tamiflu [oseltamivir phosphate], Depakote [valproic acid], and Lisinopril   Social History: The patient  reports that she has never smoked. She has never used smokeless tobacco. She reports that she does not drink alcohol and does not use drugs.   Family History: [name] family history includes Hypertension in her father.   ROS:  Please see the history of present illness. Otherwise, complete review of systems is positive for none.  All other systems are reviewed and negative.   Physical Exam: VS:  BP 102/60   Pulse 74   Ht 5\' 3"  (1.6 m)   Wt 126 lb 6.4 oz (57.3 kg)   SpO2 97%   BMI 22.39 kg/m , BMI Body mass index is 22.39 kg/m.  Wt Readings from Last 3 Encounters:  02/04/21 126 lb 6.4 oz (57.3 kg)  09/29/20 127 lb (57.6 kg)  06/25/20 124 lb (56.2 kg)    General: Patient appears comfortable at rest. Neck: Supple, no elevated JVP or carotid bruits, no thyromegaly. Lungs: Clear to auscultation, nonlabored breathing at rest. Cardiac: Regular rate and rhythm, no S3 or  significant systolic murmur, no pericardial rub. Extremities: No pitting edema, distal pulses 2+. Skin: Warm and dry. Musculoskeletal: No kyphosis. Neuropsychiatric: Alert and oriented x3, affect grossly appropriate.  ECG: EKG 02/04/2021 normal sinus rhythm rate of 74  Recent Labwork: 05/11/2020: TSH 0.012 05/12/2020: ALT 19; AST 32; BUN 21; Creatinine, Ser 0.59; Hemoglobin 9.2; Magnesium 2.3; Platelets 101; Potassium 4.2; Sodium 134     Component Value Date/Time   CHOL 222 (H) 03/01/2017 0458   TRIG 109 03/01/2017 0458   HDL 47 03/01/2017 0458   CHOLHDL 4.7 03/01/2017 0458   VLDL 22 03/01/2017 0458   LDLCALC 153 (H) 03/01/2017 0458    Other Studies Reviewed Today:   Cardiac catheterization 02/07/2019: The left ventricular systolic function is normal. LV end diastolic pressure is normal. The left ventricular ejection fraction is greater than 65% by visual estimate. There is no mitral valve regurgitation.   1. No angiographic evidence  of CAD 2. Normal LV systolic function' 3. Elevation in troponin likely due to demand ischemia in setting of rapid atrial fibrillation   Echocardiogram 01/16/2019:  1. Left ventricular ejection fraction, by visual estimation, is 70 to  75%. The left ventricle has hyperdynamic function. Normal left ventricular  posterior wall thickness. There is no left ventricular hypertrophy.   2. Elevated mean left atrial pressure.   3. Left ventricular diastolic Doppler parameters are consistent with  impaired relaxation pattern of LV diastolic filling.   4. There is focal basal septal LV hypertrophy. In the setting of focal  basal septal hypertrophy and hyperdynamic LV function there is SAM of the  anterior mitral valve leaflet and a dynamic LVOT gradient with a peak of  32 mmHg.   5. Global right ventricle has normal systolic function.The right  ventricular size is normal. No increase in right ventricular wall  thickness.   6. Left atrial size was mildly  dilated.   7. Right atrial size was normal.   8. Moderate mitral annular calcification.   9. The mitral valve is abnormal. Mild mitral valve regurgitation. No  evidence of mitral stenosis.  10. The tricuspid valve is normal in structure. Tricuspid valve  regurgitation is mild.  11. The aortic valve is tricuspid Aortic valve regurgitation was not  visualized by color flow Doppler. Structurally normal aortic valve, with  no evidence of sclerosis or stenosis.  12. The pulmonic valve was not well visualized. Pulmonic valve  regurgitation is not visualized by color flow Doppler.  13. Mildly elevated pulmonary artery systolic pressure.  14. PASP is borderline elevated at 30 mmHg.     Assessment and Plan:  1. Paroxysmal atrial fibrillation (HCC)   2. Acquired thrombophilia ([place])   3. Essential hypertension    1. Paroxysmal atrial fibrillation (HCC) EKG today normal sinus rhythm rate of 74.  Continue metoprolol 12.5 mg p.o. twice daily.  Continue Eliquis 2.5 mg p.o. twice daily.  2. Acquired thrombophilia ([place]) Denies any bleeding.  CBC on 05/12/2020 hemoglobin 9.2 and hematocrit 27.  3. Essential hypertension Blood pressure today 102/60.  She states blood pressures at home have been in the 993T to 701 systolic and diastolic in the 77L.  She states sometimes blood pressures are lower and she may feel occasional dizziness but no near syncope or syncopal episodes.  She states she decreased her valsartan down to 20 mg as a result of low blood pressures.  Continue valsartan at 20 mg daily.  Continue spironolactone 12.5 mg p.o. daily.  Medication Adjustments/Labs and Tests Ordered: Current medicines are reviewed at length with the patient today.  Concerns regarding medicines are outlined above.   Disposition: Follow-up with Dr. Domenic Polite or APP 6 months  Signed, Levell July, NP 02/04/2021 2:21 PM    [facility] [place], [place], [place] [address] Phone:  [phone]; Fax: [phone]

## 2021-02-04 ENCOUNTER — Ambulatory Visit: Payer: Medicare Other | Admitting: Cardiology

## 2021-02-04 ENCOUNTER — Encounter: Payer: Self-pay | Admitting: Family Medicine

## 2021-02-04 ENCOUNTER — Ambulatory Visit (INDEPENDENT_AMBULATORY_CARE_PROVIDER_SITE_OTHER): Payer: Medicare Other | Admitting: Family Medicine

## 2021-02-04 ENCOUNTER — Other Ambulatory Visit: Payer: Self-pay

## 2021-02-04 VITALS — BP 102/60 | HR 74 | Ht 63.0 in | Wt 126.4 lb

## 2021-02-04 DIAGNOSIS — I10 Essential (primary) hypertension: Secondary | ICD-10-CM

## 2021-02-04 DIAGNOSIS — I480 Paroxysmal atrial fibrillation: Secondary | ICD-10-CM | POA: Diagnosis not present

## 2021-02-04 DIAGNOSIS — D6869 Other thrombophilia: Secondary | ICD-10-CM

## 2021-02-04 MED ORDER — APIXABAN 2.5 MG PO TABS: 2.5000 mg | ORAL_TABLET | Freq: Two times a day (BID) | ORAL | 3 refills | Status: DC

## 2021-02-04 MED ORDER — METOPROLOL TARTRATE 25 MG PO TABS: 12.5000 mg | ORAL_TABLET | Freq: Two times a day (BID) | ORAL | 3 refills | Status: DC

## 2021-02-04 MED ORDER — VALSARTAN 40 MG PO TABS: 20.0000 mg | ORAL_TABLET | Freq: Every day | ORAL | 3 refills | Status: DC

## 2021-02-04 NOTE — Patient Instructions (Addendum)
Medication Instructions:  Your physician recommends that you continue on your current medications as directed. Please refer to the Current Medication list given to you today.  *If you need a refill on your cardiac medications before your next appointment, please call your pharmacy*   Lab Work: None ordered  If you have labs (blood work) drawn today and your tests are completely normal, you will receive your results only by: [place] (if you have MyChart) OR A paper copy in the mail If you have any lab test that is abnormal or we need to change your treatment, we will call you to review the results.   Testing/Procedures: None ordered   Follow-Up: At [facility], you and your health needs are our priority.  As part of our continuing mission to provide you with exceptional heart care, we have created designated Provider Care Teams.  These Care Teams include your primary Cardiologist (physician) and Advanced Practice Providers (APPs -  Physician Assistants and Nurse Practitioners) who all work together to provide you with the care you need, when you need it.  We recommend signing up for the patient portal called "MyChart".  Sign up information is provided on this After Visit Summary.  MyChart is used to connect with patients for Virtual Visits (Telemedicine).  Patients are able to view lab/test results, encounter notes, upcoming appointments, etc.  Non-urgent messages can be sent to your provider as well.   To learn more about what you can do with MyChart, go to [email or web address].    Your next appointment:   6 month(s)  The format for your next appointment:   In Person  Provider:   You may see Rozann Lesches, MD or the following Advanced Practice Provider on your designated Care Team:   Katina Dung, NP   Other Instructions

## 2021-04-02 DIAGNOSIS — K219 Gastro-esophageal reflux disease without esophagitis: Secondary | ICD-10-CM | POA: Diagnosis not present

## 2021-04-02 DIAGNOSIS — I10 Essential (primary) hypertension: Secondary | ICD-10-CM | POA: Diagnosis not present

## 2021-04-02 DIAGNOSIS — E7849 Other hyperlipidemia: Secondary | ICD-10-CM | POA: Diagnosis not present

## 2021-04-02 DIAGNOSIS — E0590 Thyrotoxicosis, unspecified without thyrotoxic crisis or storm: Secondary | ICD-10-CM | POA: Diagnosis not present

## 2021-04-02 DIAGNOSIS — E782 Mixed hyperlipidemia: Secondary | ICD-10-CM | POA: Diagnosis not present

## 2021-04-08 DIAGNOSIS — I10 Essential (primary) hypertension: Secondary | ICD-10-CM | POA: Diagnosis not present

## 2021-04-08 DIAGNOSIS — I480 Paroxysmal atrial fibrillation: Secondary | ICD-10-CM | POA: Diagnosis not present

## 2021-04-08 DIAGNOSIS — Z0001 Encounter for general adult medical examination with abnormal findings: Secondary | ICD-10-CM | POA: Diagnosis not present

## 2021-04-08 DIAGNOSIS — E7849 Other hyperlipidemia: Secondary | ICD-10-CM | POA: Diagnosis not present

## 2021-04-08 DIAGNOSIS — R4582 Worries: Secondary | ICD-10-CM | POA: Diagnosis not present

## 2021-04-08 DIAGNOSIS — F331 Major depressive disorder, recurrent, moderate: Secondary | ICD-10-CM | POA: Diagnosis not present

## 2021-04-08 DIAGNOSIS — I700 Atherosclerosis of aorta: Secondary | ICD-10-CM | POA: Diagnosis not present

## 2021-04-08 DIAGNOSIS — Z23 Encounter for immunization: Secondary | ICD-10-CM | POA: Diagnosis not present

## 2021-04-12 DIAGNOSIS — J101 Influenza due to other identified influenza virus with other respiratory manifestations: Secondary | ICD-10-CM | POA: Diagnosis not present

## 2021-04-12 DIAGNOSIS — Z20828 Contact with and (suspected) exposure to other viral communicable diseases: Secondary | ICD-10-CM | POA: Diagnosis not present

## 2021-04-12 DIAGNOSIS — R509 Fever, unspecified: Secondary | ICD-10-CM | POA: Diagnosis not present

## 2021-05-07 ENCOUNTER — Other Ambulatory Visit: Payer: Self-pay

## 2021-05-07 MED ORDER — SPIRONOLACTONE 25 MG PO TABS: 12.5000 mg | ORAL_TABLET | Freq: Every day | ORAL | 3 refills | Status: DC

## 2021-05-12 DIAGNOSIS — Z20828 Contact with and (suspected) exposure to other viral communicable diseases: Secondary | ICD-10-CM | POA: Diagnosis not present

## 2021-05-12 DIAGNOSIS — R059 Cough, unspecified: Secondary | ICD-10-CM | POA: Diagnosis not present

## 2021-05-12 DIAGNOSIS — R0981 Nasal congestion: Secondary | ICD-10-CM | POA: Diagnosis not present

## 2021-05-12 DIAGNOSIS — Z6822 Body mass index (BMI) 22.0-22.9, adult: Secondary | ICD-10-CM | POA: Diagnosis not present

## 2021-05-14 DIAGNOSIS — Z20828 Contact with and (suspected) exposure to other viral communicable diseases: Secondary | ICD-10-CM | POA: Diagnosis not present

## 2021-05-21 DIAGNOSIS — E0590 Thyrotoxicosis, unspecified without thyrotoxic crisis or storm: Secondary | ICD-10-CM | POA: Diagnosis not present

## 2021-05-28 ENCOUNTER — Ambulatory Visit (INDEPENDENT_AMBULATORY_CARE_PROVIDER_SITE_OTHER): Payer: Medicare Other | Admitting: Internal Medicine

## 2021-05-28 ENCOUNTER — Encounter: Payer: Self-pay | Admitting: Internal Medicine

## 2021-05-28 ENCOUNTER — Other Ambulatory Visit: Payer: Self-pay

## 2021-05-28 VITALS — BP 134/70 | HR 114 | Ht 63.0 in | Wt 125.0 lb

## 2021-05-28 DIAGNOSIS — D6869 Other thrombophilia: Secondary | ICD-10-CM | POA: Diagnosis not present

## 2021-05-28 DIAGNOSIS — I10 Essential (primary) hypertension: Secondary | ICD-10-CM

## 2021-05-28 DIAGNOSIS — I480 Paroxysmal atrial fibrillation: Secondary | ICD-10-CM

## 2021-05-28 NOTE — Patient Instructions (Addendum)
Medication Instructions:  Continue all current medications.  Labwork: none  Testing/Procedures: none  Follow-Up: 1 year - Dr.  Rayann Heman   Any Other Special Instructions Will Be Listed Below (If Applicable). AFib clinic phone number:  [phone]  If you need a refill on your cardiac medications before your next appointment, please call your pharmacy.

## 2021-05-28 NOTE — Progress Notes (Signed)
PCP: Caryl Bis, MD   Primary EP: Dr Rayann Heman  Kristen Mckenzie is a 81 y.o. female who presents today for routine electrophysiology followup.  Since last being seen in our clinic, the patient reports doing very well.  She has had very few palpitations over the past year.  Today, she denies symptoms of  chest pain, shortness of breath,  lower extremity edema, dizziness, presyncope, or syncope.  The patient is otherwise without complaint today.   Past Medical History:  Diagnosis Date   Anxiety    Arthritis    Cholecystolithiasis    Collagen vascular disease ([place])    Cystocele with prolapse    Hypertension    PAF (paroxysmal atrial fibrillation) ([place])    Rectocele    Past Surgical History:  Procedure Laterality Date   ANTERIOR AND POSTERIOR REPAIR N/A 02/21/2018   Procedure: ANTERIOR (CYSTOCELE) REPAIR;  Surgeon: Bjorn Loser, MD;  Location: [place];  Service: Urology;  Laterality: N/A;   ATRIAL FIBRILLATION ABLATION N/A 09/19/2019   Procedure: ATRIAL FIBRILLATION ABLATION;  Surgeon: Thompson Grayer, MD;  Location: [place] [facility];  Service: Cardiovascular;  Laterality: N/A;   CARDIOVERSION N/A 09/29/2019   Procedure: CARDIOVERSION;  Surgeon: Evans Lance, MD;  Location: [place];  Service: Cardiovascular;  Laterality: N/A;   CYSTOSCOPY N/A 02/21/2018   Procedure: CYSTOSCOPY;  Surgeon: Bjorn Loser, MD;  Location: [facility];  Service: Urology;  Laterality: N/A;   EYE SURGERY Bilateral 2011   cataract extraction    LAPAROSCOPIC VAGINAL HYSTERECTOMY WITH SALPINGO OOPHORECTOMY Bilateral 02/21/2018   Procedure: LAPAROSCOPIC ASSISTED VAGINAL HYSTERECTOMY WITH SALPINGO OOPHORECTOMY;  Surgeon: Maisie Fus, MD;  [facility];  Service: Gynecology;  Laterality: Bilateral;  Dr. Matilde Sprang to follow   LEFT HEART CATH AND CORONARY ANGIOGRAPHY N/A 02/07/2019   Procedure: LEFT HEART CATH AND CORONARY ANGIOGRAPHY;  Surgeon:  Burnell Blanks, MD;  Location: [place] [facility];  Service: Cardiovascular;  Laterality: N/A;    ROS- all systems are reviewed and negatives except as per HPI above  Current Outpatient Medications  Medication Sig Dispense Refill   acetaminophen (TYLENOL) 325 MG tablet Take 2 tablets (650 mg total) by mouth every 4 (four) hours as needed for headache or mild pain.     apixaban (ELIQUIS) 2.5 MG TABS tablet Take 1 tablet (2.5 mg total) by mouth 2 (two) times daily. 180 tablet 3   atorvastatin (LIPITOR) 40 MG tablet Take 0.5 tablets (20 mg total) by mouth daily.     cholecalciferol (VITAMIN D) 1000 units tablet Take 1,000 Units daily by mouth.     COD LIVER OIL PO Take 1 capsule by mouth every other day.      metoprolol tartrate (LOPRESSOR) 25 MG tablet Take 0.5 tablets (12.5 mg total) by mouth 2 (two) times daily. 90 tablet 3   Multiple Vitamin (MULTIVITAMIN WITH MINERALS) TABS tablet Take 1 tablet daily by mouth.     pantoprazole (PROTONIX) 40 MG tablet Take 40 mg by mouth daily as needed (heartburn). As needed     Potassium 99 MG TABS Take 99 mg by mouth 3 (three) times a week.      spironolactone (ALDACTONE) 25 MG tablet Take 0.5 tablets (12.5 mg total) by mouth daily. 45 tablet 3   valsartan (DIOVAN) 40 MG tablet Take 0.5 tablets (20 mg total) by mouth daily. 45 tablet 3   zolpidem (AMBIEN) 5 MG tablet Take 5 mg by mouth at bedtime  as needed for sleep.      No current facility-administered medications for this visit.    Physical Exam: Vitals:   05/28/21 1142  BP: 134/70  Pulse: (!) 114  SpO2: 96%  Weight: 125 lb (56.7 kg)  Height: 5\' 3"  (1.6 m)    GEN- The patient is well appearing, alert and oriented x 3 today.   Head- normocephalic, atraumatic Eyes-  Sclera clear, conjunctiva pink Ears- hearing intact Oropharynx- clear Lungs- Clear to ausculation bilaterally, normal work of breathing Heart- Regular rate and rhythm, no murmurs, rubs or gallops, PMI not laterally  displaced GI- soft, NT, ND, + BS Extremities- no clubbing, cyanosis, or edema  Wt Readings from Last 3 Encounters:  05/28/21 125 lb (56.7 kg)  02/04/21 126 lb 6.4 oz (57.3 kg)  09/29/20 127 lb (57.6 kg)    EKG tracing ordered today is personally reviewed and shows sinus  Assessment and Plan:  Paroxysmal atrial fibrillation Well controlled post ablation off AAD therapy Chads2vasc score is 4.   She is on eliquis I will obtain lab results from Dr Olena Heckle office  2. HTN Stable No change required today  3. HL Continue lipitor 20mg  daily I will obtain lab results from Dr Olena Heckle office  Risks, benefits and potential toxicities for medications prescribed and/or refilled reviewed with patient today.   Return in a year  Thompson Grayer MD, [facility] 05/28/2021 11:52 AM

## 2021-06-05 ENCOUNTER — Other Ambulatory Visit: Payer: Self-pay | Admitting: Cardiology

## 2021-07-01 DIAGNOSIS — Z20828 Contact with and (suspected) exposure to other viral communicable diseases: Secondary | ICD-10-CM | POA: Diagnosis not present

## 2021-07-26 DIAGNOSIS — M9903 Segmental and somatic dysfunction of lumbar region: Secondary | ICD-10-CM | POA: Diagnosis not present

## 2021-07-26 DIAGNOSIS — S8392XA Sprain of unspecified site of left knee, initial encounter: Secondary | ICD-10-CM | POA: Diagnosis not present

## 2021-07-26 DIAGNOSIS — S338XXA Sprain of other parts of lumbar spine and pelvis, initial encounter: Secondary | ICD-10-CM | POA: Diagnosis not present

## 2021-07-27 DIAGNOSIS — S8392XA Sprain of unspecified site of left knee, initial encounter: Secondary | ICD-10-CM | POA: Diagnosis not present

## 2021-07-27 DIAGNOSIS — M9903 Segmental and somatic dysfunction of lumbar region: Secondary | ICD-10-CM | POA: Diagnosis not present

## 2021-07-27 DIAGNOSIS — S338XXA Sprain of other parts of lumbar spine and pelvis, initial encounter: Secondary | ICD-10-CM | POA: Diagnosis not present

## 2021-08-02 DIAGNOSIS — M9903 Segmental and somatic dysfunction of lumbar region: Secondary | ICD-10-CM | POA: Diagnosis not present

## 2021-08-02 DIAGNOSIS — S8392XA Sprain of unspecified site of left knee, initial encounter: Secondary | ICD-10-CM | POA: Diagnosis not present

## 2021-08-02 DIAGNOSIS — S338XXA Sprain of other parts of lumbar spine and pelvis, initial encounter: Secondary | ICD-10-CM | POA: Diagnosis not present

## 2021-08-05 DIAGNOSIS — S8392XA Sprain of unspecified site of left knee, initial encounter: Secondary | ICD-10-CM | POA: Diagnosis not present

## 2021-08-05 DIAGNOSIS — M9903 Segmental and somatic dysfunction of lumbar region: Secondary | ICD-10-CM | POA: Diagnosis not present

## 2021-08-05 DIAGNOSIS — S338XXA Sprain of other parts of lumbar spine and pelvis, initial encounter: Secondary | ICD-10-CM | POA: Diagnosis not present

## 2021-08-06 ENCOUNTER — Telehealth: Payer: Self-pay | Admitting: Cardiology

## 2021-08-06 DIAGNOSIS — I10 Essential (primary) hypertension: Secondary | ICD-10-CM | POA: Diagnosis not present

## 2021-08-06 DIAGNOSIS — E0590 Thyrotoxicosis, unspecified without thyrotoxic crisis or storm: Secondary | ICD-10-CM | POA: Diagnosis not present

## 2021-08-06 DIAGNOSIS — K219 Gastro-esophageal reflux disease without esophagitis: Secondary | ICD-10-CM | POA: Diagnosis not present

## 2021-08-06 DIAGNOSIS — E7849 Other hyperlipidemia: Secondary | ICD-10-CM | POA: Diagnosis not present

## 2021-08-06 DIAGNOSIS — E782 Mixed hyperlipidemia: Secondary | ICD-10-CM | POA: Diagnosis not present

## 2021-08-06 MED ORDER — METOPROLOL TARTRATE 25 MG PO TABS: 12.5000 mg | ORAL_TABLET | Freq: Two times a day (BID) | ORAL | 3 refills | Status: DC

## 2021-08-06 NOTE — Telephone Encounter (Signed)
Done

## 2021-08-06 NOTE — Telephone Encounter (Signed)
No other c/o chest pain, sob, tachycardia, or dizziness.  States that she just notices a few flutters occasionally and these are not bothersome currently.   Suggested that she continue to monitor for now but Lopressor can always be increased if necessary.  Scheduled to see Dr. Domenic Polite 08/25/2021.   ?

## 2021-08-06 NOTE — Telephone Encounter (Signed)
Patient notified and verbalized understanding. 

## 2021-08-06 NOTE — Telephone Encounter (Signed)
? ?  Agree with recommendations for continued monitoring and she should check her HR at home when this occurs if able to do so. It appears she has a history of atrial fibrillation and required prior ablation and she is on Eliquis for anticoagulation. If she starts to have more palpitations, we can increase Lopressor from 12.5 mg twice daily to 25 mg twice daily. She should reach out if this progresses in the interim, otherwise can be addressed at her follow-up visit on 08/25/2021. ? ?Signed, ?Erma Heritage, PA-C ?08/06/2021, 2:44 PM ?Pager: [phone] ? ?

## 2021-08-06 NOTE — Telephone Encounter (Signed)
?*  STAT* If patient is at the pharmacy, call can be transferred to refill team. ? ? ?1. Which medications need to be refilled? (please list name of each medication and dose if known) metoprolol tartrate (LOPRESSOR) 25 MG  ? ?2. Which pharmacy/location (including street and city if local pharmacy) [facility]    [address] [place] ? ?3. Do they need a 30 day or 90 day supply? 90 ? ?Patient took her last pill today.  ?  ?

## 2021-08-06 NOTE — Telephone Encounter (Signed)
Patient called stating that every now and then she has noticed some heart flutters.  ?

## 2021-08-11 DIAGNOSIS — I10 Essential (primary) hypertension: Secondary | ICD-10-CM | POA: Diagnosis not present

## 2021-08-11 DIAGNOSIS — I480 Paroxysmal atrial fibrillation: Secondary | ICD-10-CM | POA: Diagnosis not present

## 2021-08-11 DIAGNOSIS — R4582 Worries: Secondary | ICD-10-CM | POA: Diagnosis not present

## 2021-08-11 DIAGNOSIS — S338XXA Sprain of other parts of lumbar spine and pelvis, initial encounter: Secondary | ICD-10-CM | POA: Diagnosis not present

## 2021-08-11 DIAGNOSIS — I700 Atherosclerosis of aorta: Secondary | ICD-10-CM | POA: Diagnosis not present

## 2021-08-11 DIAGNOSIS — S8392XA Sprain of unspecified site of left knee, initial encounter: Secondary | ICD-10-CM | POA: Diagnosis not present

## 2021-08-11 DIAGNOSIS — E7849 Other hyperlipidemia: Secondary | ICD-10-CM | POA: Diagnosis not present

## 2021-08-11 DIAGNOSIS — M9903 Segmental and somatic dysfunction of lumbar region: Secondary | ICD-10-CM | POA: Diagnosis not present

## 2021-08-11 DIAGNOSIS — Z6822 Body mass index (BMI) 22.0-22.9, adult: Secondary | ICD-10-CM | POA: Diagnosis not present

## 2021-08-23 DIAGNOSIS — I10 Essential (primary) hypertension: Secondary | ICD-10-CM | POA: Diagnosis not present

## 2021-08-23 DIAGNOSIS — J209 Acute bronchitis, unspecified: Secondary | ICD-10-CM | POA: Diagnosis not present

## 2021-08-23 DIAGNOSIS — J019 Acute sinusitis, unspecified: Secondary | ICD-10-CM | POA: Diagnosis not present

## 2021-08-23 DIAGNOSIS — Z6822 Body mass index (BMI) 22.0-22.9, adult: Secondary | ICD-10-CM | POA: Diagnosis not present

## 2021-08-25 ENCOUNTER — Encounter: Payer: Self-pay | Admitting: Cardiology

## 2021-08-25 ENCOUNTER — Ambulatory Visit (INDEPENDENT_AMBULATORY_CARE_PROVIDER_SITE_OTHER): Payer: Medicare Other | Admitting: Cardiology

## 2021-08-25 VITALS — BP 118/60 | HR 64 | Ht 63.0 in | Wt 127.8 lb

## 2021-08-25 DIAGNOSIS — S338XXA Sprain of other parts of lumbar spine and pelvis, initial encounter: Secondary | ICD-10-CM | POA: Diagnosis not present

## 2021-08-25 DIAGNOSIS — I480 Paroxysmal atrial fibrillation: Secondary | ICD-10-CM | POA: Diagnosis not present

## 2021-08-25 DIAGNOSIS — S8392XA Sprain of unspecified site of left knee, initial encounter: Secondary | ICD-10-CM | POA: Diagnosis not present

## 2021-08-25 DIAGNOSIS — I10 Essential (primary) hypertension: Secondary | ICD-10-CM

## 2021-08-25 DIAGNOSIS — M9903 Segmental and somatic dysfunction of lumbar region: Secondary | ICD-10-CM | POA: Diagnosis not present

## 2021-08-25 NOTE — Patient Instructions (Addendum)

## 2021-08-25 NOTE — Progress Notes (Signed)
? ? ?Cardiology Office Note ? ?Date: 08/25/2021  ? ?ID: Kristen Mckenzie, DOB 1940-10-06, MRN [id number] ? ?PCP:  Caryl Bis, MD  ?Cardiologist:  Rozann Lesches, MD ?Electrophysiologist:  Thompson Grayer, MD  ? ?Chief Complaint  ?Patient presents with  ? Cardiac follow-up  ? ? ?History of Present Illness: ?Kristen Mckenzie is an 81 y.o. female last seen in October 2022 by Mr. Leonides Sake NP.  She is here for a routine visit.  Doing very well without significant palpitations, no chest discomfort. ? ?She had a visit with Dr. Rayann Heman in February, I reviewed the note and ECG.  Follow-up tracing today shows normal sinus rhythm. ? ?I reviewed her medications which are stable from a cardiac perspective and outlined below.  She had lab work with Dr. Quillian Quince recently.  No spontaneous bleeding problems on Eliquis. ? ?Past Medical History:  ?Diagnosis Date  ? Anxiety   ? Arthritis   ? Cholecystolithiasis   ? Collagen vascular disease ([place])   ? Cystocele with prolapse   ? Hypertension   ? PAF (paroxysmal atrial fibrillation) ([place])   ? Rectocele   ? ? ?Past Surgical History:  ?Procedure Laterality Date  ? ANTERIOR AND POSTERIOR REPAIR N/A 02/21/2018  ? Procedure: ANTERIOR (CYSTOCELE) REPAIR;  Surgeon: Bjorn Loser, MD;  Location: [place];  Service: Urology;  Laterality: N/A;  ? ATRIAL FIBRILLATION ABLATION N/A 09/19/2019  ? Procedure: ATRIAL FIBRILLATION ABLATION;  Surgeon: Thompson Grayer, MD;  Location: [place] [facility];  Service: Cardiovascular;  Laterality: N/A;  ? CARDIOVERSION N/A 09/29/2019  ? Procedure: CARDIOVERSION;  Surgeon: Evans Lance, MD;  Location: [place];  Service: Cardiovascular;  Laterality: N/A;  ? CYSTOSCOPY N/A 02/21/2018  ? Procedure: CYSTOSCOPY;  Surgeon: Bjorn Loser, MD;  Location: [facility];  Service: Urology;  Laterality: N/A;  ? EYE SURGERY Bilateral 2011  ? cataract extraction   ? LAPAROSCOPIC VAGINAL HYSTERECTOMY WITH SALPINGO OOPHORECTOMY Bilateral 02/21/2018   ? Procedure: LAPAROSCOPIC ASSISTED VAGINAL HYSTERECTOMY WITH SALPINGO OOPHORECTOMY;  Surgeon: Maisie Fus, MD;  Location: [facility];  Service: Gynecology;  Laterality: Bilateral;  Dr. Matilde Sprang to follow  ? LEFT HEART CATH AND CORONARY ANGIOGRAPHY N/A 02/07/2019  ? Procedure: LEFT HEART CATH AND CORONARY ANGIOGRAPHY;  Surgeon: Burnell Blanks, MD;  Location: [place] [facility];  Service: Cardiovascular;  Laterality: N/A;  ? ? ?Current Outpatient Medications  ?Medication Sig Dispense Refill  ? acetaminophen (TYLENOL) 325 MG tablet Take 2 tablets (650 mg total) by mouth every 4 (four) hours as needed for headache or mild pain.    ? apixaban (ELIQUIS) 2.5 MG TABS tablet Take 1 tablet (2.5 mg total) by mouth 2 (two) times daily. 180 tablet 3  ? cholecalciferol (VITAMIN D) 1000 units tablet Take 2,000 Units by mouth daily.    ? COD LIVER OIL PO Take 1 capsule by mouth every other day.     ? metoprolol tartrate (LOPRESSOR) 25 MG tablet Take 0.5 tablets (12.5 mg total) by mouth 2 (two) times daily. 90 tablet 3  ? Multiple Vitamin (MULTIVITAMIN WITH MINERALS) TABS tablet Take 1 tablet daily by mouth.    ? pantoprazole (PROTONIX) 40 MG tablet Take 40 mg by mouth daily as needed (heartburn). As needed    ? Potassium 99 MG TABS Take 99 mg by mouth 3 (three) times a week.     ? spironolactone (ALDACTONE) 25 MG tablet TAKE 1 TABLET (25 MG TOTAL) BY MOUTH DAILY. 90 tablet 1  ?  valsartan (DIOVAN) 40 MG tablet Take 0.5 tablets (20 mg total) by mouth daily. 45 tablet 3  ? zolpidem (AMBIEN) 5 MG tablet Take 5 mg by mouth at bedtime as needed for sleep.     ? atorvastatin (LIPITOR) 40 MG tablet Take 0.5 tablets (20 mg total) by mouth daily. (Patient not taking: Reported on 08/25/2021)    ? ?No current facility-administered medications for this visit.  ? ?Allergies:  Phenobarbital, Tamiflu [oseltamivir phosphate], Depakote [valproic acid], and Lisinopril  ? ?ROS:  Seasonal allergies. ? ?Physical Exam: ?VS:   BP 118/60   Pulse 64   Ht '5\' 3"'$  (1.6 m)   Wt 127 lb 12.8 oz (58 kg)   SpO2 95%   BMI 22.64 kg/m? , BMI Body mass index is 22.64 kg/m?. ? ?Wt Readings from Last 3 Encounters:  ?08/25/21 127 lb 12.8 oz (58 kg)  ?05/28/21 125 lb (56.7 kg)  ?02/04/21 126 lb 6.4 oz (57.3 kg)  ?  ?General: Patient appears comfortable at rest. ?HEENT: Conjunctiva and lids normal. ?Neck: Supple, no elevated JVP or carotid bruits, no thyromegaly. ?Lungs: Clear to auscultation, nonlabored breathing at rest. ?Cardiac: Regular rate and rhythm, no S3, 2/6 systolic murmur. ?Extremities: No pitting edema. ? ?ECG:  An ECG dated 05/28/2021 was personally reviewed today and demonstrated:  Sinus rhythm. ? ?Recent Labwork: ? ?05/11/2020: TSH 0.012 ?05/12/2020: ALT 19; AST 32; BUN 21; Creatinine, Ser 0.59; Hemoglobin 9.2; Magnesium 2.3; Platelets 101; Potassium 4.2; Sodium 134  ? ?Other Studies Reviewed Today: ? ?Cardiac catheterization 02/07/2019: ?The left ventricular systolic function is normal. ?LV end diastolic pressure is normal. ?The left ventricular ejection fraction is greater than 65% by visual estimate. ?There is no mitral valve regurgitation. ?  ?1. No angiographic evidence of CAD ?2. Normal LV systolic function' ?3. Elevation in troponin likely due to demand ischemia in setting of rapid atrial fibrillation ?  ?Echocardiogram 01/16/2019: ? 1. Left ventricular ejection fraction, by visual estimation, is 70 to  ?75%. The left ventricle has hyperdynamic function. Normal left ventricular  ?posterior wall thickness. There is no left ventricular hypertrophy.  ? 2. Elevated mean left atrial pressure.  ? 3. Left ventricular diastolic Doppler parameters are consistent with  ?impaired relaxation pattern of LV diastolic filling.  ? 4. There is focal basal septal LV hypertrophy. In the setting of focal  ?basal septal hypertrophy and hyperdynamic LV function there is SAM of the  ?anterior mitral valve leaflet and a dynamic LVOT gradient with a peak of   ?32 mmHg.  ? 5. Global right ventricle has normal systolic function.The right  ?ventricular size is normal. No increase in right ventricular wall  ?thickness.  ? 6. Left atrial size was mildly dilated.  ? 7. Right atrial size was normal.  ? 8. Moderate mitral annular calcification.  ? 9. The mitral valve is abnormal. Mild mitral valve regurgitation. No  ?evidence of mitral stenosis.  ?10. The tricuspid valve is normal in structure. Tricuspid valve  ?regurgitation is mild.  ?11. The aortic valve is tricuspid Aortic valve regurgitation was not  ?visualized by color flow Doppler. Structurally normal aortic valve, with  ?no evidence of sclerosis or stenosis.  ?12. The pulmonic valve was not well visualized. Pulmonic valve  ?regurgitation is not visualized by color flow Doppler.  ?13. Mildly elevated pulmonary artery systolic pressure.  ?14. PASP is borderline elevated at 30 mmHg.  ? ?Assessment and Plan: ? ?1.  Paroxysmal atrial fibrillation with CHA2DS2-VASc score of 4.  She has done  well following ablation, no longer on antiarrhythmic therapy.  ECG is normal today.  Continue Eliquis for stroke prophylaxis, also Lopressor. ? ?2.  Essential hypertension, on Diovan, Aldactone, and Lopressor.  Blood pressure is normal today.  No changes were made. ? ?Medication Adjustments/Labs and Tests Ordered: ?Current medicines are reviewed at length with the patient today.  Concerns regarding medicines are outlined above.  ? ?Tests Ordered: ?Orders Placed This Encounter  ?Procedures  ? EKG 12-Lead  ? ? ?Medication Changes: ?No orders of the defined types were placed in this encounter. ? ? ?Disposition:  Follow up  6 months. ? ?Signed, ?Satira Sark, MD, [facility] ?08/25/2021 11:47 AM    ?[place] [facility] ?[place], [place], [place] [address] ?Phone: [phone]; Fax: [phone]  ?

## 2021-08-28 DIAGNOSIS — J019 Acute sinusitis, unspecified: Secondary | ICD-10-CM | POA: Diagnosis not present

## 2021-08-28 DIAGNOSIS — Z6822 Body mass index (BMI) 22.0-22.9, adult: Secondary | ICD-10-CM | POA: Diagnosis not present

## 2021-08-28 DIAGNOSIS — J209 Acute bronchitis, unspecified: Secondary | ICD-10-CM | POA: Diagnosis not present

## 2021-09-10 DIAGNOSIS — E0590 Thyrotoxicosis, unspecified without thyrotoxic crisis or storm: Secondary | ICD-10-CM | POA: Diagnosis not present

## 2021-09-15 ENCOUNTER — Telehealth: Payer: Self-pay | Admitting: Cardiology

## 2021-09-15 MED ORDER — VALSARTAN 40 MG PO TABS: 20.0000 mg | ORAL_TABLET | Freq: Every day | ORAL | 3 refills | Status: DC

## 2021-09-15 NOTE — Telephone Encounter (Signed)
*  STAT* If patient is at the pharmacy, call can be transferred to refill team.   1. Which medications need to be refilled? (please list name of each medication and dose if known) need a new prescription for Valsartan 20 mg  2. Which pharmacy/location (including street and city if local pharmacy) is [facility] sent [facility] [place]  3. Do they need a 30 day or 90 day supply? 90 days and refills

## 2021-09-23 DIAGNOSIS — H35361 Drusen (degenerative) of macula, right eye: Secondary | ICD-10-CM | POA: Diagnosis not present

## 2021-12-10 DIAGNOSIS — I480 Paroxysmal atrial fibrillation: Secondary | ICD-10-CM | POA: Diagnosis not present

## 2021-12-10 DIAGNOSIS — Z6822 Body mass index (BMI) 22.0-22.9, adult: Secondary | ICD-10-CM | POA: Diagnosis not present

## 2021-12-10 DIAGNOSIS — E0590 Thyrotoxicosis, unspecified without thyrotoxic crisis or storm: Secondary | ICD-10-CM | POA: Diagnosis not present

## 2021-12-10 DIAGNOSIS — I10 Essential (primary) hypertension: Secondary | ICD-10-CM | POA: Diagnosis not present

## 2021-12-10 DIAGNOSIS — E7849 Other hyperlipidemia: Secondary | ICD-10-CM | POA: Diagnosis not present

## 2021-12-10 DIAGNOSIS — I700 Atherosclerosis of aorta: Secondary | ICD-10-CM | POA: Diagnosis not present

## 2021-12-17 ENCOUNTER — Other Ambulatory Visit: Payer: Self-pay | Admitting: Cardiology

## 2021-12-17 MED ORDER — APIXABAN 2.5 MG PO TABS: 2.5000 mg | ORAL_TABLET | Freq: Two times a day (BID) | ORAL | 3 refills | Status: DC

## 2021-12-17 NOTE — Telephone Encounter (Signed)
Prescription refill request for Eliquis received. Indication:Afib Last office visit:5/23 Scr:0.7 Age: 81 Weight:58 kg  Prescription refilled

## 2021-12-17 NOTE — Telephone Encounter (Signed)
    1. Which medications need to be refilled? (please list name of each medication and dose if known) apixaban (ELIQUIS) 2.5 MG TABS tablet   2. Which pharmacy/location (including street and city if local pharmacy) is medication to be sent to?   [facility] [address] [place]   3. Do they need a 30 day or 90 day supply? 90 day supply.  [facility]   [place] [place] and not mail order.

## 2021-12-23 ENCOUNTER — Encounter: Payer: Self-pay | Admitting: Cardiology

## 2022-01-14 ENCOUNTER — Telehealth: Payer: Self-pay | Admitting: Cardiology

## 2022-01-14 NOTE — Telephone Encounter (Signed)
Patient states the last 2 weeks she has noticed increased fluttering in heart & pounding in her head.  No chest pain or SOB.  States she does have strange feeling in chest during the episodes at the time.  BP has been running higher than normal for her.  175/78, 168/76  69, 164/80  66, 187/90.  States that she does think she has been eating more sodium lately as she feels like everything she buys has it in it.  Next OV is 03/11/22 with SM.

## 2022-01-14 NOTE — Telephone Encounter (Signed)
"  Patient c/o Palpitations:  High priority if patient c/o lightheadedness, shortness of breath, or chest pain  How long have you had palpitations/irregular HR/ Afib? Are you having the symptoms now? no  Are you currently experiencing lightheadedness, SOB or CP? no  Do you have a history of afib (atrial fibrillation) or irregular heart rhythm? yes  Have you checked your BP or HR? (document readings if available): yes not available   Are you experiencing any other symptoms? Patient states that for the  past 2 weeks she is having episodes of A. Flutter.She states that she will get a pounding in her head . She states that she has been eating a lot of extra salt.

## 2022-01-18 ENCOUNTER — Ambulatory Visit: Payer: Medicare Other

## 2022-01-18 NOTE — Telephone Encounter (Signed)
Patient notified and verbalized understanding.  Nurse visit scheduled for tomorrow morning at 9:00 am [facility] office.

## 2022-01-19 ENCOUNTER — Ambulatory Visit: Payer: Medicare Other | Attending: Cardiology

## 2022-01-19 ENCOUNTER — Other Ambulatory Visit: Payer: Self-pay | Admitting: *Deleted

## 2022-01-19 VITALS — BP 118/64 | HR 72 | Ht 62.0 in | Wt 126.2 lb

## 2022-01-19 DIAGNOSIS — I10 Essential (primary) hypertension: Secondary | ICD-10-CM | POA: Insufficient documentation

## 2022-01-19 DIAGNOSIS — I4891 Unspecified atrial fibrillation: Secondary | ICD-10-CM | POA: Insufficient documentation

## 2022-01-19 DIAGNOSIS — Z79899 Other long term (current) drug therapy: Secondary | ICD-10-CM | POA: Diagnosis not present

## 2022-01-19 MED ORDER — VALSARTAN 40 MG PO TABS: 40.0000 mg | ORAL_TABLET | Freq: Every day | ORAL | Status: DC

## 2022-01-19 NOTE — Progress Notes (Signed)
Patient notified and verbalized understanding. 

## 2022-01-19 NOTE — Progress Notes (Signed)
Left message to return call 

## 2022-01-19 NOTE — Progress Notes (Signed)
Patient came in the office as a nurse visit for her BP. States that for the past 2 weeks she has been having intermittent fluttering in her chest. Denies chest pain but has some dizziness when bending over which she states has been going on for over a month but more since the fluttering started.

## 2022-01-19 NOTE — Patient Instructions (Signed)
Medication Instructions:  Your physician recommends that you continue on your current medications as directed. Please refer to the Current Medication list given to you today.   Labwork: none  Testing/Procedures: none  Follow-Up:  Your physician recommends that you schedule a follow-up appointment in:   Any Other Special Instructions Will Be Listed Below (If Applicable).  If you need a refill on your cardiac medications before your next appointment, please call your pharmacy.

## 2022-01-19 NOTE — Progress Notes (Signed)
Patient states that her BP at home this morning (7:40 am) was 147/73  70.  Has not taken any medications this morning yet.  States that she does also have a pounding in her head - typically her BP is up at that time.  Does also state that she feels a flutter or a "wave" as she describes it occasionally.  Does notice this being worse over the last 2 weeks.    EKG scanned into epic.   BP readings :    9/20 - 1:15 pm - 146/72  67 9/21 - 4:58 pm - 150/71  73 9/23 - 5:50 pm - 175/78  64 9/25 - 7:42 pm - 168/76  69 9/26 - 8:49 pm - 160/69  74 9/26 - 10:37 pm - 136/71  61 9/27 - 7:40 am - 14/73  70

## 2022-02-01 ENCOUNTER — Other Ambulatory Visit (HOSPITAL_COMMUNITY)
Admission: RE | Admit: 2022-02-01 | Discharge: 2022-02-01 | Disposition: A | Discharge status: Home / Self Care | Payer: Medicare Other | Source: Ambulatory Visit | Attending: Cardiology | Admitting: Cardiology

## 2022-02-01 DIAGNOSIS — I10 Essential (primary) hypertension: Secondary | ICD-10-CM | POA: Insufficient documentation

## 2022-02-01 DIAGNOSIS — Z79899 Other long term (current) drug therapy: Secondary | ICD-10-CM | POA: Insufficient documentation

## 2022-02-01 LAB — BASIC METABOLIC PANEL
Anion gap: 10 (ref 5–15)
BUN: 26 mg/dL — ABNORMAL HIGH (ref 8–23)
CO2: 24 mmol/L (ref 22–32)
Calcium: 9.6 mg/dL (ref 8.9–10.3)
Chloride: 101 mmol/L (ref 98–111)
Creatinine, Ser: 0.79 mg/dL (ref 0.44–1.00)
GFR, Estimated: >60 mL/min (ref >60)
Glucose, Bld: 95 mg/dL (ref 70–99)
Potassium: 4.2 mmol/L (ref 3.5–5.1)
Sodium: 135 mmol/L (ref 135–145)

## 2022-03-08 DIAGNOSIS — Z1231 Encounter for screening mammogram for malignant neoplasm of breast: Secondary | ICD-10-CM | POA: Diagnosis not present

## 2022-03-08 DIAGNOSIS — N958 Other specified menopausal and perimenopausal disorders: Secondary | ICD-10-CM | POA: Diagnosis not present

## 2022-03-08 DIAGNOSIS — Z7689 Persons encountering health services in other specified circumstances: Secondary | ICD-10-CM | POA: Diagnosis not present

## 2022-03-08 DIAGNOSIS — Z01419 Encounter for gynecological examination (general) (routine) without abnormal findings: Secondary | ICD-10-CM | POA: Diagnosis not present

## 2022-03-08 DIAGNOSIS — Z6823 Body mass index (BMI) 23.0-23.9, adult: Secondary | ICD-10-CM | POA: Diagnosis not present

## 2022-03-08 DIAGNOSIS — M816 Localized osteoporosis [Lequesne]: Secondary | ICD-10-CM | POA: Diagnosis not present

## 2022-03-08 DIAGNOSIS — R29890 Loss of height: Secondary | ICD-10-CM | POA: Diagnosis not present

## 2022-03-09 ENCOUNTER — Ambulatory Visit: Payer: Medicare Other | Admitting: Cardiology

## 2022-03-10 NOTE — Progress Notes (Signed)
Cardiology Office Note  Date: 03/11/2022   ID: Kristen, Mckenzie July 09, 1940, MRN [id number]  PCP:  Caryl Bis, MD  Cardiologist:  Rozann Lesches, MD Electrophysiologist:  Thompson Grayer, MD   Chief Complaint  Patient presents with   Cardiac follow-up    History of Present Illness: Kristen Mckenzie is an 81 y.o. female last seen in May.  She is here for a routine visit.  Reports no progressive palpitations, no chest pain with typical activities.  She did have a period of time when her blood pressure was elevated, increased her Diovan temporarily but did not tolerate this due to weakness.  She has since cut the dose back and feels better, blood pressure is well controlled today.  She continues to monitor this.  I reviewed her ECG today which shows normal sinus rhythm.  She remains on Eliquis for stroke prophylaxis, CHA2DS2-VASc score 4.  Also on low-dose Lopressor.  I did review her interval lab work.  Past Medical History:  Diagnosis Date   Anxiety    Arthritis    Cholecystolithiasis    Collagen vascular disease ([place])    Cystocele with prolapse    Hypertension    PAF (paroxysmal atrial fibrillation) ([place])    Rectocele     Past Surgical History:  Procedure Laterality Date   ANTERIOR AND POSTERIOR REPAIR N/A 02/21/2018   Procedure: ANTERIOR (CYSTOCELE) REPAIR;  Surgeon: Bjorn Loser, MD;  Location: [place];  Service: Urology;  Laterality: N/A;   ATRIAL FIBRILLATION ABLATION N/A 09/19/2019   Procedure: ATRIAL FIBRILLATION ABLATION;  Surgeon: Thompson Grayer, MD;  Location: [place] [facility];  Service: Cardiovascular;  Laterality: N/A;   CARDIOVERSION N/A 09/29/2019   Procedure: CARDIOVERSION;  Surgeon: Evans Lance, MD;  Location: [place];  Service: Cardiovascular;  Laterality: N/A;   CYSTOSCOPY N/A 02/21/2018   Procedure: CYSTOSCOPY;  Surgeon: Bjorn Loser, MD;  Location: [facility];  Service: Urology;  Laterality: N/A;    EYE SURGERY Bilateral 2011   cataract extraction    LAPAROSCOPIC VAGINAL HYSTERECTOMY WITH SALPINGO OOPHORECTOMY Bilateral 02/21/2018   Procedure: LAPAROSCOPIC ASSISTED VAGINAL HYSTERECTOMY WITH SALPINGO OOPHORECTOMY;  Surgeon: Maisie Fus, MD;  Location: [facility];  Service: Gynecology;  Laterality: Bilateral;  Dr. Matilde Sprang to follow   LEFT HEART CATH AND CORONARY ANGIOGRAPHY N/A 02/07/2019   Procedure: LEFT HEART CATH AND CORONARY ANGIOGRAPHY;  Surgeon: Burnell Blanks, MD;  Location: [place] [facility];  Service: Cardiovascular;  Laterality: N/A;    Current Outpatient Medications  Medication Sig Dispense Refill   acetaminophen (TYLENOL) 325 MG tablet Take 2 tablets (650 mg total) by mouth every 4 (four) hours as needed for headache or mild pain.     apixaban (ELIQUIS) 2.5 MG TABS tablet Take 1 tablet (2.5 mg total) by mouth 2 (two) times daily. 180 tablet 3   atorvastatin (LIPITOR) 40 MG tablet Take 0.5 tablets (20 mg total) by mouth daily.     cholecalciferol (VITAMIN D) 1000 units tablet Take 2,000 Units by mouth daily.     COD LIVER OIL PO Take 1 capsule by mouth every other day.      Cyanocobalamin (VITAMIN B-12 PO) Take 1 tablet by mouth daily.     metoprolol tartrate (LOPRESSOR) 25 MG tablet Take 0.5 tablets (12.5 mg total) by mouth 2 (two) times daily. 90 tablet 3   Multiple Vitamin (MULTIVITAMIN WITH MINERALS) TABS tablet Take 1 tablet daily by mouth.  pantoprazole (PROTONIX) 40 MG tablet Take 40 mg by mouth daily as needed (heartburn). As needed     Potassium 99 MG TABS Take 99 mg by mouth 3 (three) times a week.      Pyridoxine HCl (VITAMIN B-6 PO) Take 1 tablet by mouth daily.     spironolactone (ALDACTONE) 25 MG tablet TAKE 1 TABLET (25 MG TOTAL) BY MOUTH DAILY. (Patient taking differently: Take 12.5 mg by mouth daily.) 90 tablet 1   valsartan (DIOVAN) 40 MG tablet Take 20 mg by mouth daily.     zolpidem (AMBIEN) 5 MG tablet Take 5 mg by mouth at  bedtime as needed for sleep.      No current facility-administered medications for this visit.   Allergies:  Phenobarbital, Tamiflu [oseltamivir phosphate], Depakote [valproic acid], and Lisinopril   ROS:  No syncope.  Physical Exam: VS:  BP 122/60 (BP Location: Right Arm, Patient Position: Sitting, Cuff Size: Normal)   Pulse 72   Ht '5\' 1"'$  (1.549 m)   Wt 125 lb (56.7 kg)   BMI 23.62 kg/m , BMI Body mass index is 23.62 kg/m.  Wt Readings from Last 3 Encounters:  03/11/22 125 lb (56.7 kg)  01/19/22 126 lb 3.2 oz (57.2 kg)  08/25/21 127 lb 12.8 oz (58 kg)    General: Patient appears comfortable at rest. HEENT: Conjunctiva and lids normal. Neck: Supple, no elevated JVP or carotid bruits. Lungs: Clear to auscultation, nonlabored breathing at rest. Cardiac: Regular rate and rhythm, no S3, 2/6 systolic murmur, no pericardial rub. Extremities: No pitting edema.  ECG:  An ECG dated 01/19/2022 was personally reviewed today and demonstrated:  Sinus rhythm with low voltage.  Recent Labwork: 02/01/2022: BUN 26; Creatinine, Ser 0.79; Potassium 4.2; Sodium 135   Other Studies Reviewed Today:  Cardiac catheterization 02/07/2019: The left ventricular systolic function is normal. LV end diastolic pressure is normal. The left ventricular ejection fraction is greater than 65% by visual estimate. There is no mitral valve regurgitation.   1. No angiographic evidence of CAD 2. Normal LV systolic function' 3. Elevation in troponin likely due to demand ischemia in setting of rapid atrial fibrillation   Echocardiogram 01/16/2019:  1. Left ventricular ejection fraction, by visual estimation, is 70 to  75%. The left ventricle has hyperdynamic function. Normal left ventricular  posterior wall thickness. There is no left ventricular hypertrophy.   2. Elevated mean left atrial pressure.   3. Left ventricular diastolic Doppler parameters are consistent with  impaired relaxation pattern of LV  diastolic filling.   4. There is focal basal septal LV hypertrophy. In the setting of focal  basal septal hypertrophy and hyperdynamic LV function there is SAM of the  anterior mitral valve leaflet and a dynamic LVOT gradient with a peak of  32 mmHg.   5. Global right ventricle has normal systolic function.The right  ventricular size is normal. No increase in right ventricular wall  thickness.   6. Left atrial size was mildly dilated.   7. Right atrial size was normal.   8. Moderate mitral annular calcification.   9. The mitral valve is abnormal. Mild mitral valve regurgitation. No  evidence of mitral stenosis.  10. The tricuspid valve is normal in structure. Tricuspid valve  regurgitation is mild.  11. The aortic valve is tricuspid Aortic valve regurgitation was not  visualized by color flow Doppler. Structurally normal aortic valve, with  no evidence of sclerosis or stenosis.  12. The pulmonic valve was not well visualized.  Pulmonic valve  regurgitation is not visualized by color flow Doppler.  13. Mildly elevated pulmonary artery systolic pressure.  14. PASP is borderline elevated at 30 mmHg.   Assessment and Plan:  1.  Paroxysmal atrial fibrillation with CHA2DS2-VASc score of 4.  She is doing well status post atrial fibrillation ablation.  Continues on Eliquis for stroke prophylaxis along with low-dose Lopressor.  ECG is normal today.  2.  Hypertension, blood pressure well controlled today on current regimen including Diovan and Aldactone.  No changes were made.  Medication Adjustments/Labs and Tests Ordered: Current medicines are reviewed at length with the patient today.  Concerns regarding medicines are outlined above.   Tests Ordered: Orders Placed This Encounter  Procedures   EKG 12-Lead    Medication Changes: No orders of the defined types were placed in this encounter.   Disposition:  Follow up  6 months.  Signed, Satira Sark, MD, [facility] 03/11/2022 11:28  AM    [facility] [place], [place], [place] [address] Phone: [phone]; Fax: [phone]

## 2022-03-11 ENCOUNTER — Encounter: Payer: Self-pay | Admitting: Cardiology

## 2022-03-11 ENCOUNTER — Ambulatory Visit: Payer: Medicare Other | Attending: Cardiology | Admitting: Cardiology

## 2022-03-11 VITALS — BP 122/60 | HR 72 | Ht 61.0 in | Wt 125.0 lb

## 2022-03-11 DIAGNOSIS — I480 Paroxysmal atrial fibrillation: Secondary | ICD-10-CM | POA: Insufficient documentation

## 2022-03-11 DIAGNOSIS — I10 Essential (primary) hypertension: Secondary | ICD-10-CM | POA: Diagnosis not present

## 2022-03-11 NOTE — Patient Instructions (Signed)
Medication Instructions:  Continue all current medications.   Labwork: none  Testing/Procedures: none  Follow-Up: 6 months   Any Other Special Instructions Will Be Listed Below (If Applicable).   If you need a refill on your cardiac medications before your next appointment, please call your pharmacy.  

## 2022-03-14 ENCOUNTER — Other Ambulatory Visit: Payer: Self-pay | Admitting: Obstetrics and Gynecology

## 2022-03-14 DIAGNOSIS — R928 Other abnormal and inconclusive findings on diagnostic imaging of breast: Secondary | ICD-10-CM

## 2022-03-24 DIAGNOSIS — R030 Elevated blood-pressure reading, without diagnosis of hypertension: Secondary | ICD-10-CM | POA: Diagnosis not present

## 2022-03-24 DIAGNOSIS — R059 Cough, unspecified: Secondary | ICD-10-CM | POA: Diagnosis not present

## 2022-03-24 DIAGNOSIS — Z6822 Body mass index (BMI) 22.0-22.9, adult: Secondary | ICD-10-CM | POA: Diagnosis not present

## 2022-03-24 DIAGNOSIS — J209 Acute bronchitis, unspecified: Secondary | ICD-10-CM | POA: Diagnosis not present

## 2022-03-26 DIAGNOSIS — Z20828 Contact with and (suspected) exposure to other viral communicable diseases: Secondary | ICD-10-CM | POA: Diagnosis not present

## 2022-03-26 DIAGNOSIS — J329 Chronic sinusitis, unspecified: Secondary | ICD-10-CM | POA: Diagnosis not present

## 2022-03-26 DIAGNOSIS — J40 Bronchitis, not specified as acute or chronic: Secondary | ICD-10-CM | POA: Diagnosis not present

## 2022-03-26 DIAGNOSIS — Z6822 Body mass index (BMI) 22.0-22.9, adult: Secondary | ICD-10-CM | POA: Diagnosis not present

## 2022-03-26 DIAGNOSIS — R030 Elevated blood-pressure reading, without diagnosis of hypertension: Secondary | ICD-10-CM | POA: Diagnosis not present

## 2022-04-05 DIAGNOSIS — E7849 Other hyperlipidemia: Secondary | ICD-10-CM | POA: Diagnosis not present

## 2022-04-05 DIAGNOSIS — E782 Mixed hyperlipidemia: Secondary | ICD-10-CM | POA: Diagnosis not present

## 2022-04-05 DIAGNOSIS — I10 Essential (primary) hypertension: Secondary | ICD-10-CM | POA: Diagnosis not present

## 2022-04-05 DIAGNOSIS — K2100 Gastro-esophageal reflux disease with esophagitis, without bleeding: Secondary | ICD-10-CM | POA: Diagnosis not present

## 2022-04-05 DIAGNOSIS — E0590 Thyrotoxicosis, unspecified without thyrotoxic crisis or storm: Secondary | ICD-10-CM | POA: Diagnosis not present

## 2022-04-12 DIAGNOSIS — Z6822 Body mass index (BMI) 22.0-22.9, adult: Secondary | ICD-10-CM | POA: Diagnosis not present

## 2022-04-12 DIAGNOSIS — Z0001 Encounter for general adult medical examination with abnormal findings: Secondary | ICD-10-CM | POA: Diagnosis not present

## 2022-04-12 DIAGNOSIS — E7849 Other hyperlipidemia: Secondary | ICD-10-CM | POA: Diagnosis not present

## 2022-04-12 DIAGNOSIS — R4582 Worries: Secondary | ICD-10-CM | POA: Diagnosis not present

## 2022-04-12 DIAGNOSIS — I10 Essential (primary) hypertension: Secondary | ICD-10-CM | POA: Diagnosis not present

## 2022-04-12 DIAGNOSIS — I480 Paroxysmal atrial fibrillation: Secondary | ICD-10-CM | POA: Diagnosis not present

## 2022-04-12 DIAGNOSIS — I700 Atherosclerosis of aorta: Secondary | ICD-10-CM | POA: Diagnosis not present

## 2022-04-13 ENCOUNTER — Ambulatory Visit
Admission: RE | Admit: 2022-04-13 | Discharge: 2022-04-13 | Disposition: A | Discharge status: Home / Self Care | Payer: Medicare Other | Source: Ambulatory Visit | Attending: Obstetrics and Gynecology | Admitting: Obstetrics and Gynecology

## 2022-04-13 ENCOUNTER — Ambulatory Visit: Payer: Medicare Other

## 2022-04-13 DIAGNOSIS — R928 Other abnormal and inconclusive findings on diagnostic imaging of breast: Secondary | ICD-10-CM

## 2022-04-13 DIAGNOSIS — R922 Inconclusive mammogram: Secondary | ICD-10-CM | POA: Diagnosis not present

## 2022-04-30 DIAGNOSIS — J0101 Acute recurrent maxillary sinusitis: Secondary | ICD-10-CM | POA: Diagnosis not present

## 2022-04-30 DIAGNOSIS — Z6822 Body mass index (BMI) 22.0-22.9, adult: Secondary | ICD-10-CM | POA: Diagnosis not present

## 2022-04-30 DIAGNOSIS — Z20828 Contact with and (suspected) exposure to other viral communicable diseases: Secondary | ICD-10-CM | POA: Diagnosis not present

## 2022-04-30 DIAGNOSIS — R030 Elevated blood-pressure reading, without diagnosis of hypertension: Secondary | ICD-10-CM | POA: Diagnosis not present

## 2022-05-04 DIAGNOSIS — R059 Cough, unspecified: Secondary | ICD-10-CM | POA: Diagnosis not present

## 2022-05-04 DIAGNOSIS — R9389 Abnormal findings on diagnostic imaging of other specified body structures: Secondary | ICD-10-CM | POA: Diagnosis not present

## 2022-05-04 DIAGNOSIS — Z6822 Body mass index (BMI) 22.0-22.9, adult: Secondary | ICD-10-CM | POA: Diagnosis not present

## 2022-05-04 DIAGNOSIS — R030 Elevated blood-pressure reading, without diagnosis of hypertension: Secondary | ICD-10-CM | POA: Diagnosis not present

## 2022-05-04 DIAGNOSIS — J329 Chronic sinusitis, unspecified: Secondary | ICD-10-CM | POA: Diagnosis not present

## 2022-05-10 ENCOUNTER — Other Ambulatory Visit (HOSPITAL_COMMUNITY): Payer: Self-pay | Admitting: Physician Assistant

## 2022-05-10 DIAGNOSIS — R9389 Abnormal findings on diagnostic imaging of other specified body structures: Secondary | ICD-10-CM

## 2022-05-13 DIAGNOSIS — R030 Elevated blood-pressure reading, without diagnosis of hypertension: Secondary | ICD-10-CM | POA: Diagnosis not present

## 2022-05-13 DIAGNOSIS — J329 Chronic sinusitis, unspecified: Secondary | ICD-10-CM | POA: Diagnosis not present

## 2022-05-13 DIAGNOSIS — Z6822 Body mass index (BMI) 22.0-22.9, adult: Secondary | ICD-10-CM | POA: Diagnosis not present

## 2022-05-13 DIAGNOSIS — I700 Atherosclerosis of aorta: Secondary | ICD-10-CM | POA: Diagnosis not present

## 2022-05-13 DIAGNOSIS — R9389 Abnormal findings on diagnostic imaging of other specified body structures: Secondary | ICD-10-CM | POA: Diagnosis not present

## 2022-05-19 ENCOUNTER — Other Ambulatory Visit: Payer: Self-pay | Admitting: Cardiology

## 2022-06-24 ENCOUNTER — Ambulatory Visit: Payer: Medicare Other | Attending: Cardiovascular Disease | Admitting: Cardiovascular Disease

## 2022-06-24 VITALS — BP 130/74 | HR 67 | Ht 62.5 in | Wt 121.0 lb

## 2022-06-24 DIAGNOSIS — D6869 Other thrombophilia: Secondary | ICD-10-CM | POA: Diagnosis not present

## 2022-06-24 DIAGNOSIS — I480 Paroxysmal atrial fibrillation: Secondary | ICD-10-CM | POA: Diagnosis not present

## 2022-06-24 NOTE — Addendum Note (Signed)
Addended by: Sharee Holster R on: 06/24/2022 04:01 PM   Modules accepted: Orders

## 2022-06-24 NOTE — Progress Notes (Signed)
PCP: Caryl Bis, MD   Primary EP: Dr Myles Gip  Kristen Mckenzie is a 82 y.o. female who presents today for routine electrophysiology followup.  Since last being seen in our clinic, the patient reports doing very well.  She has had very few palpitations over the past year and reports that they have not changes substantially in burden or severity. She does not think her current arrhythmia would warrant changing her medications. Today, she denies symptoms of  chest pain, shortness of breath,  lower extremity edema, dizziness, presyncope, or syncope.  The patient is otherwise without complaint today.   Past Medical History:  Diagnosis Date   Anxiety    Arthritis    Cholecystolithiasis    Collagen vascular disease ([place])    Cystocele with prolapse    Hypertension    PAF (paroxysmal atrial fibrillation) ([place])    Rectocele    Past Surgical History:  Procedure Laterality Date   ANTERIOR AND POSTERIOR REPAIR N/A 02/21/2018   Procedure: ANTERIOR (CYSTOCELE) REPAIR;  Surgeon: Bjorn Loser, MD;  Location: [place];  Service: Urology;  Laterality: N/A;   ATRIAL FIBRILLATION ABLATION N/A 09/19/2019   Procedure: ATRIAL FIBRILLATION ABLATION;  Surgeon: Thompson Grayer, MD;  Location: [place] [facility];  Service: Cardiovascular;  Laterality: N/A;   CARDIOVERSION N/A 09/29/2019   Procedure: CARDIOVERSION;  Surgeon: Evans Lance, MD;  Location: [place];  Service: Cardiovascular;  Laterality: N/A;   CYSTOSCOPY N/A 02/21/2018   Procedure: CYSTOSCOPY;  Surgeon: Bjorn Loser, MD;  Location: [facility];  Service: Urology;  Laterality: N/A;   EYE SURGERY Bilateral 2011   cataract extraction    LAPAROSCOPIC VAGINAL HYSTERECTOMY WITH SALPINGO OOPHORECTOMY Bilateral 02/21/2018   Procedure: LAPAROSCOPIC ASSISTED VAGINAL HYSTERECTOMY WITH SALPINGO OOPHORECTOMY;  Surgeon: Maisie Fus, MD;  Location: [facility];  Service: Gynecology;  Laterality:  Bilateral;  Dr. Matilde Sprang to follow   LEFT HEART CATH AND CORONARY ANGIOGRAPHY N/A 02/07/2019   Procedure: LEFT HEART CATH AND CORONARY ANGIOGRAPHY;  Surgeon: Burnell Blanks, MD;  Location: [place] [facility];  Service: Cardiovascular;  Laterality: N/A;    ROS- all systems are reviewed and negatives except as per HPI above  Current Outpatient Medications  Medication Sig Dispense Refill   acetaminophen (TYLENOL) 325 MG tablet Take 2 tablets (650 mg total) by mouth every 4 (four) hours as needed for headache or mild pain.     apixaban (ELIQUIS) 2.5 MG TABS tablet Take 1 tablet (2.5 mg total) by mouth 2 (two) times daily. 180 tablet 3   cholecalciferol (VITAMIN D) 1000 units tablet Take 2,000 Units by mouth daily.     COD LIVER OIL PO Take 1 capsule by mouth every other day.      Cyanocobalamin (VITAMIN B-12 PO) Take 1 tablet by mouth daily.     metoprolol tartrate (LOPRESSOR) 25 MG tablet Take 0.5 tablets (12.5 mg total) by mouth 2 (two) times daily. 90 tablet 3   Multiple Vitamin (MULTIVITAMIN WITH MINERALS) TABS tablet Take 1 tablet daily by mouth.     pantoprazole (PROTONIX) 40 MG tablet Take 40 mg by mouth daily as needed (heartburn). As needed     Potassium 99 MG TABS Take 99 mg by mouth 3 (three) times a week.      Pyridoxine HCl (VITAMIN B-6 PO) Take 1 tablet by mouth daily.     spironolactone (ALDACTONE) 25 MG tablet Take 0.5 tablets (12.5 mg total) by mouth daily. 45 tablet  2   zolpidem (AMBIEN) 5 MG tablet Take 5 mg by mouth at bedtime as needed for sleep.      atorvastatin (LIPITOR) 40 MG tablet Take 0.5 tablets (20 mg total) by mouth daily. (Patient not taking: Reported on 06/24/2022)     valsartan (DIOVAN) 40 MG tablet Take 20 mg by mouth daily. (Patient not taking: Reported on 06/24/2022)     No current facility-administered medications for this visit.    Physical Exam: Vitals:   06/24/22 1327  BP: 130/74  Pulse: 67  SpO2: 96%  Weight: 121 lb (54.9 kg)  Height: 5'  2.5" (1.588 m)    Gen: Appears comfortable, well-nourished CV: RRR, no dependent edema Pulm: breathing easily   Wt Readings from Last 3 Encounters:  06/24/22 121 lb (54.9 kg)  03/11/22 125 lb (56.7 kg)  01/19/22 126 lb 3.2 oz (57.2 kg)    EKG tracing ordered today is personally reviewed and shows sinus  Assessment and Plan:  Paroxysmal atrial fibrillation Well controlled post ablation off AAD therapy Chads2vasc score is 4.   She is on eliquis Normal renal function documented in the past 6 months.  2. HTN Stable No change required today  3. HL Continue lipitor '20mg'$  daily  Risks, benefits and potential toxicities for medications prescribed and/or refilled reviewed with patient today.   Return in a year  Melida Quitter, MD 06/24/2022 1:37 PM

## 2022-06-24 NOTE — Patient Instructions (Signed)
Medication Instructions:  Continue all current medications.  Labwork: none  Testing/Procedures: none  Follow-Up: 1 year - Dr.  Mealor    Any Other Special Instructions Will Be Listed Below (If Applicable).   If you need a refill on your cardiac medications before your next appointment, please call your pharmacy.  

## 2022-06-25 ENCOUNTER — Other Ambulatory Visit: Payer: Self-pay | Admitting: Cardiology

## 2022-07-12 ENCOUNTER — Telehealth: Payer: Self-pay | Admitting: Cardiology

## 2022-07-12 MED ORDER — METOPROLOL TARTRATE 25 MG PO TABS: 12.5000 mg | ORAL_TABLET | Freq: Two times a day (BID) | ORAL | 3 refills | Status: DC

## 2022-07-12 NOTE — Telephone Encounter (Signed)
Reports periodically she takes an extra 12.5 mg lopressor as needed for heart racing. Will forward to provider to approve adding these directions to regimen.

## 2022-07-12 NOTE — Telephone Encounter (Signed)
Rodrigo Ran, pharmacist at [facility] who says lopressor 25 taking 1/2 tablet BID last filled on 04/27/2022 for a 90 day supply-due and can be filled again on 07/17/2022

## 2022-07-12 NOTE — Telephone Encounter (Signed)
Pt c/o medication issue:  1. Name of Medication: metoprolol tartrate (LOPRESSOR) 25 MG tablet   2. How are you currently taking this medication (dosage and times per day)? TAKE 0.5 TABLETS BY MOUTH 2 TIMES DAILY.   3. Are you having a reaction (difficulty breathing--STAT)? No  4. What is your medication issue? Pt stated she went to pick up above medication and pharmacy told her she couldn't get another refill until 4/15. Pt states she only has 1 week of medication left so that will not work. She would like a callback asap in regards to this matter. Please advise.

## 2022-10-05 ENCOUNTER — Ambulatory Visit: Payer: Medicare Other | Attending: Cardiology | Admitting: Cardiology

## 2022-10-05 ENCOUNTER — Encounter: Payer: Self-pay | Admitting: Cardiology

## 2022-10-05 VITALS — BP 112/69 | HR 76 | Ht 63.0 in | Wt 122.8 lb

## 2022-10-05 DIAGNOSIS — E7849 Other hyperlipidemia: Secondary | ICD-10-CM | POA: Diagnosis not present

## 2022-10-05 DIAGNOSIS — I10 Essential (primary) hypertension: Secondary | ICD-10-CM | POA: Diagnosis not present

## 2022-10-05 DIAGNOSIS — K2100 Gastro-esophageal reflux disease with esophagitis, without bleeding: Secondary | ICD-10-CM | POA: Diagnosis not present

## 2022-10-05 DIAGNOSIS — I480 Paroxysmal atrial fibrillation: Secondary | ICD-10-CM | POA: Insufficient documentation

## 2022-10-05 NOTE — Patient Instructions (Addendum)

## 2022-10-05 NOTE — Progress Notes (Signed)
    Cardiology Office Note  Date: 10/05/2022   ID: Kristen Mckenzie, DOB 02/18/1941, MRN [id number]  History of Present Illness: Kristen Mckenzie is an 82 y.o. female last seen in November 2023.  She is here for a routine visit.  Reports no progressive palpitations, has used an extra Lopressor only a few times in the last 6 months.  She describes stable NYHA class II dyspnea.  No syncope.  She had a visit with Dr. Nelly Laurence in March, I reviewed the note.  ECG today shows sinus rhythm with low voltage.  I went over her medications which are otherwise stable from a cardiac perspective.  She does not report any bleeding problems on Eliquis.  Physical Exam: VS:  BP 112/69   Pulse 76   Ht 5\' 3"  (1.6 m)   Wt 122 lb 12.8 oz (55.7 kg)   SpO2 96%   BMI 21.75 kg/m , BMI Body mass index is 21.75 kg/m.  Wt Readings from Last 3 Encounters:  10/05/22 122 lb 12.8 oz (55.7 kg)  06/24/22 121 lb (54.9 kg)  03/11/22 125 lb (56.7 kg)    General: Patient appears comfortable at rest. HEENT: Conjunctiva and lids normal. Neck: Supple, no elevated JVP or carotid bruits. Lungs: Clear to auscultation, nonlabored breathing at rest. Cardiac: Regular rate and rhythm, no S3, 2/6 systolic murmur. Extremities: No pitting edema.  ECG:  An ECG dated 06/24/2022 was personally reviewed today and demonstrated:  Some with borderline low voltage.  Labwork: 02/01/2022: BUN 26; Creatinine, Ser 0.79; Potassium 4.2; Sodium 135  December 2023: BUN 16, creatinine 0.76, potassium 4.6, AST 22, ALT 18, cholesterol 258, triglycerides 158, HDL 60, LDL 169, TSH 0.995  Other Studies Reviewed Today:  No interval cardiac testing for review today.  Assessment and Plan:  1.  Paroxysmal atrial fibrillation with CHA2DS2-VASc score of 4.  She underwent atrial fibrillation ablation with Dr. Johney Frame back in 2021.  Overall doing well with no progressive palpitations on current regimen including Lopressor and stroke prophylaxis with  Eliquis.  She is in sinus rhythm today by ECG.  No changes were made.  She does not report any spontaneous bleeding problems.  2.  Essential hypertension.  Blood pressure is well-controlled today.  Continue Aldactone.  Disposition:  Follow up  6 months.  Signed, Jonelle Sidle, M.D., F.A.C.C. [facility] at [facility]

## 2022-10-12 DIAGNOSIS — I482 Chronic atrial fibrillation, unspecified: Secondary | ICD-10-CM | POA: Diagnosis not present

## 2022-10-12 DIAGNOSIS — I10 Essential (primary) hypertension: Secondary | ICD-10-CM | POA: Diagnosis not present

## 2022-10-12 DIAGNOSIS — R4582 Worries: Secondary | ICD-10-CM | POA: Diagnosis not present

## 2022-10-12 DIAGNOSIS — E7849 Other hyperlipidemia: Secondary | ICD-10-CM | POA: Diagnosis not present

## 2022-10-12 DIAGNOSIS — I700 Atherosclerosis of aorta: Secondary | ICD-10-CM | POA: Diagnosis not present

## 2022-10-12 DIAGNOSIS — Z6822 Body mass index (BMI) 22.0-22.9, adult: Secondary | ICD-10-CM | POA: Diagnosis not present

## 2022-11-01 ENCOUNTER — Telehealth: Payer: Self-pay | Admitting: Cardiology

## 2022-11-01 NOTE — Telephone Encounter (Signed)
   Patient Name: Kristen Mckenzie  DOB: 07/13/1940 MRN: [id number]  Primary Cardiologist: Nona Dell, MD  Chart reviewed as part of pre-operative protocol coverage.   Dental extractions of 1-2 teeth are considered low risk procedures per guidelines and generally do not require any specific cardiac clearance. It is also generally accepted that for extractions of 1-2 teeth and dental cleanings, there is no need to interrupt blood thinner therapy.  SBE prophylaxis is not required for the patient from a cardiac standpoint.  I will route this recommendation to the requesting party via Epic fax function and remove from pre-op pool.  Please call with questions.  Carlos Levering, NP 11/01/2022, 2:50 PM

## 2022-11-01 NOTE — Telephone Encounter (Signed)
   Pre-operative Risk Assessment    Patient Name: Kristen Mckenzie  DOB: January 13, 1941 MRN: [id number]     Request for Surgical Clearance    Procedure:  Dental Extraction - Amount of Teeth to be Pulled:  1  Date of Surgery:  Clearance TBD                                 Surgeon:  Dr. Juanetta Gosling  Surgeon's Group or Practice Name:  [facility]  Phone number:  [phone] Fax number:  [phone]   Type of Clearance Requested:   - Medical  - Pharmacy:  Hold Apixaban (Eliquis)     Type of Anesthesia:  Local    Additional requests/questions:    [name]   11/01/2022, 12:38 PM

## 2022-11-02 DIAGNOSIS — Z6822 Body mass index (BMI) 22.0-22.9, adult: Secondary | ICD-10-CM | POA: Diagnosis not present

## 2022-11-02 DIAGNOSIS — L03115 Cellulitis of right lower limb: Secondary | ICD-10-CM | POA: Diagnosis not present

## 2022-11-02 DIAGNOSIS — R030 Elevated blood-pressure reading, without diagnosis of hypertension: Secondary | ICD-10-CM | POA: Diagnosis not present

## 2022-11-11 DIAGNOSIS — Z6822 Body mass index (BMI) 22.0-22.9, adult: Secondary | ICD-10-CM | POA: Diagnosis not present

## 2022-11-11 DIAGNOSIS — I10 Essential (primary) hypertension: Secondary | ICD-10-CM | POA: Diagnosis not present

## 2022-11-24 DIAGNOSIS — I10 Essential (primary) hypertension: Secondary | ICD-10-CM | POA: Diagnosis not present

## 2022-11-24 DIAGNOSIS — M25572 Pain in left ankle and joints of left foot: Secondary | ICD-10-CM | POA: Diagnosis not present

## 2022-12-01 DIAGNOSIS — R030 Elevated blood-pressure reading, without diagnosis of hypertension: Secondary | ICD-10-CM | POA: Diagnosis not present

## 2022-12-01 DIAGNOSIS — C44722 Squamous cell carcinoma of skin of right lower limb, including hip: Secondary | ICD-10-CM | POA: Diagnosis not present

## 2022-12-01 DIAGNOSIS — L989 Disorder of the skin and subcutaneous tissue, unspecified: Secondary | ICD-10-CM | POA: Diagnosis not present

## 2022-12-30 ENCOUNTER — Other Ambulatory Visit: Payer: Self-pay | Admitting: Cardiology

## 2022-12-30 DIAGNOSIS — I480 Paroxysmal atrial fibrillation: Secondary | ICD-10-CM

## 2022-12-30 NOTE — Telephone Encounter (Signed)
Prescription refill request for Eliquis received. Indication: Afib  Last office visit: 10/05/22 Diona Browner)  Scr: 0.79 (02/01/22)  Age: 82 Weight: 55.7kg  Appropriate dose. Refill sent.

## 2023-01-03 DIAGNOSIS — D225 Melanocytic nevi of trunk: Secondary | ICD-10-CM | POA: Diagnosis not present

## 2023-01-03 DIAGNOSIS — Z85828 Personal history of other malignant neoplasm of skin: Secondary | ICD-10-CM | POA: Diagnosis not present

## 2023-01-03 DIAGNOSIS — Z1283 Encounter for screening for malignant neoplasm of skin: Secondary | ICD-10-CM | POA: Diagnosis not present

## 2023-01-03 DIAGNOSIS — Z08 Encounter for follow-up examination after completed treatment for malignant neoplasm: Secondary | ICD-10-CM | POA: Diagnosis not present

## 2023-02-03 DIAGNOSIS — S338XXA Sprain of other parts of lumbar spine and pelvis, initial encounter: Secondary | ICD-10-CM | POA: Diagnosis not present

## 2023-02-03 DIAGNOSIS — M9903 Segmental and somatic dysfunction of lumbar region: Secondary | ICD-10-CM | POA: Diagnosis not present

## 2023-02-03 DIAGNOSIS — S8392XA Sprain of unspecified site of left knee, initial encounter: Secondary | ICD-10-CM | POA: Diagnosis not present

## 2023-02-10 DIAGNOSIS — S338XXA Sprain of other parts of lumbar spine and pelvis, initial encounter: Secondary | ICD-10-CM | POA: Diagnosis not present

## 2023-02-10 DIAGNOSIS — S8392XA Sprain of unspecified site of left knee, initial encounter: Secondary | ICD-10-CM | POA: Diagnosis not present

## 2023-02-10 DIAGNOSIS — M9903 Segmental and somatic dysfunction of lumbar region: Secondary | ICD-10-CM | POA: Diagnosis not present

## 2023-02-17 DIAGNOSIS — M9903 Segmental and somatic dysfunction of lumbar region: Secondary | ICD-10-CM | POA: Diagnosis not present

## 2023-02-17 DIAGNOSIS — S8392XA Sprain of unspecified site of left knee, initial encounter: Secondary | ICD-10-CM | POA: Diagnosis not present

## 2023-02-17 DIAGNOSIS — S338XXA Sprain of other parts of lumbar spine and pelvis, initial encounter: Secondary | ICD-10-CM | POA: Diagnosis not present

## 2023-02-21 DIAGNOSIS — Z08 Encounter for follow-up examination after completed treatment for malignant neoplasm: Secondary | ICD-10-CM | POA: Diagnosis not present

## 2023-02-21 DIAGNOSIS — L821 Other seborrheic keratosis: Secondary | ICD-10-CM | POA: Diagnosis not present

## 2023-02-21 DIAGNOSIS — Z85828 Personal history of other malignant neoplasm of skin: Secondary | ICD-10-CM | POA: Diagnosis not present

## 2023-02-24 DIAGNOSIS — M9903 Segmental and somatic dysfunction of lumbar region: Secondary | ICD-10-CM | POA: Diagnosis not present

## 2023-02-24 DIAGNOSIS — S338XXA Sprain of other parts of lumbar spine and pelvis, initial encounter: Secondary | ICD-10-CM | POA: Diagnosis not present

## 2023-02-24 DIAGNOSIS — S8392XA Sprain of unspecified site of left knee, initial encounter: Secondary | ICD-10-CM | POA: Diagnosis not present

## 2023-03-03 DIAGNOSIS — S8392XA Sprain of unspecified site of left knee, initial encounter: Secondary | ICD-10-CM | POA: Diagnosis not present

## 2023-03-03 DIAGNOSIS — S338XXA Sprain of other parts of lumbar spine and pelvis, initial encounter: Secondary | ICD-10-CM | POA: Diagnosis not present

## 2023-03-03 DIAGNOSIS — M9903 Segmental and somatic dysfunction of lumbar region: Secondary | ICD-10-CM | POA: Diagnosis not present

## 2023-03-10 DIAGNOSIS — S338XXA Sprain of other parts of lumbar spine and pelvis, initial encounter: Secondary | ICD-10-CM | POA: Diagnosis not present

## 2023-03-10 DIAGNOSIS — M9903 Segmental and somatic dysfunction of lumbar region: Secondary | ICD-10-CM | POA: Diagnosis not present

## 2023-03-10 DIAGNOSIS — S8392XA Sprain of unspecified site of left knee, initial encounter: Secondary | ICD-10-CM | POA: Diagnosis not present

## 2023-03-14 DIAGNOSIS — H35363 Drusen (degenerative) of macula, bilateral: Secondary | ICD-10-CM | POA: Diagnosis not present

## 2023-03-15 DIAGNOSIS — Z01419 Encounter for gynecological examination (general) (routine) without abnormal findings: Secondary | ICD-10-CM | POA: Diagnosis not present

## 2023-03-15 DIAGNOSIS — R32 Unspecified urinary incontinence: Secondary | ICD-10-CM | POA: Diagnosis not present

## 2023-03-15 DIAGNOSIS — Z6822 Body mass index (BMI) 22.0-22.9, adult: Secondary | ICD-10-CM | POA: Diagnosis not present

## 2023-03-17 DIAGNOSIS — S338XXA Sprain of other parts of lumbar spine and pelvis, initial encounter: Secondary | ICD-10-CM | POA: Diagnosis not present

## 2023-03-17 DIAGNOSIS — M9903 Segmental and somatic dysfunction of lumbar region: Secondary | ICD-10-CM | POA: Diagnosis not present

## 2023-03-17 DIAGNOSIS — S8392XA Sprain of unspecified site of left knee, initial encounter: Secondary | ICD-10-CM | POA: Diagnosis not present

## 2023-04-12 DIAGNOSIS — E7849 Other hyperlipidemia: Secondary | ICD-10-CM | POA: Diagnosis not present

## 2023-04-12 DIAGNOSIS — K2100 Gastro-esophageal reflux disease with esophagitis, without bleeding: Secondary | ICD-10-CM | POA: Diagnosis not present

## 2023-04-12 DIAGNOSIS — I10 Essential (primary) hypertension: Secondary | ICD-10-CM | POA: Diagnosis not present

## 2023-04-12 DIAGNOSIS — Z1321 Encounter for screening for nutritional disorder: Secondary | ICD-10-CM | POA: Diagnosis not present

## 2023-04-12 DIAGNOSIS — Z0001 Encounter for general adult medical examination with abnormal findings: Secondary | ICD-10-CM | POA: Diagnosis not present

## 2023-04-12 DIAGNOSIS — E0590 Thyrotoxicosis, unspecified without thyrotoxic crisis or storm: Secondary | ICD-10-CM | POA: Diagnosis not present

## 2023-04-14 ENCOUNTER — Encounter: Payer: Self-pay | Admitting: Cardiology

## 2023-04-14 ENCOUNTER — Ambulatory Visit: Payer: Medicare Other | Attending: Cardiology | Admitting: Cardiology

## 2023-04-14 VITALS — BP 124/62 | HR 59 | Ht 63.0 in | Wt 122.8 lb

## 2023-04-14 DIAGNOSIS — I10 Essential (primary) hypertension: Secondary | ICD-10-CM | POA: Diagnosis not present

## 2023-04-14 DIAGNOSIS — I480 Paroxysmal atrial fibrillation: Secondary | ICD-10-CM | POA: Insufficient documentation

## 2023-04-14 NOTE — Patient Instructions (Addendum)

## 2023-04-14 NOTE — Progress Notes (Signed)
    Cardiology Office Note  Date: 04/14/2023   ID: Ameri, Garciaramirez 1940/07/04, MRN [id number]  History of Present Illness: Kristen Mckenzie is an 82 y.o. female last seen in June.  She is here for a follow-up visit.  Reports no increasing palpitations, no exertional chest pain or change in stamina.  Still functional with ADLs and housework.  I reviewed her medications.  Current cardiac regimen includes Eliquis, Lopressor, and Aldactone.  She has been tracking blood pressure at home, started back on valsartan 20 mg daily.  We discussed taking this around the time she takes her Aldactone in the early afternoon.  She has not had to use any extra Lopressor.  I reviewed her ECG today which shows sinus bradycardia with low voltage.  She just had lab work at [facility] and will see Dr. Reuel Boom soon.  Physical Exam: VS:  BP 124/62 (BP Location: Left Arm, Cuff Size: Normal)   Pulse (!) 59   Ht 5\' 3"  (1.6 m)   Wt 122 lb 12.8 oz (55.7 kg)   SpO2 97%   BMI 21.75 kg/m , BMI Body mass index is 21.75 kg/m.  Wt Readings from Last 3 Encounters:  04/14/23 122 lb 12.8 oz (55.7 kg)  10/05/22 122 lb 12.8 oz (55.7 kg)  06/24/22 121 lb (54.9 kg)    General: Patient appears comfortable at rest. HEENT: Conjunctiva and lids normal. Neck: Supple, no elevated JVP or carotid bruits. Lungs: Clear to auscultation, nonlabored breathing at rest. Cardiac: Regular rate and rhythm, no S3, 2/6 systolic murmur. Extremities: No pitting edema.  ECG:  An ECG dated 10/05/2022 was personally reviewed today and demonstrated:  Sinus rhythm with low voltage.  Labwork:  December 2023: BUN 16, creatinine 0.76, potassium 4.6, AST 22, ALT 18, cholesterol 258, triglycerides 158, HDL 60, LDL 169, TSH 0.995   Other Studies Reviewed Today:  No interval cardiac testing for review today.  Assessment and Plan:  1.  Paroxysmal atrial fibrillation with CHA2DS2-VASc score of 4.  She underwent atrial fibrillation ablation  with Dr. Johney Frame back in 2021.  She continues to do well with no increasing palpitations on stable dose of Lopressor and Eliquis.  No reported spontaneous bleeding problems.  I reviewed her ECG.   2.  Primary hypertension.  She started back on valsartan 20 mg daily since last visit, refill provided.  Otherwise continue Aldactone and continue to track blood pressure at home.  Disposition:  Follow up  6 months.  Signed, Jonelle Sidle, M.D., F.A.C.C. [facility] at [facility]

## 2023-04-17 ENCOUNTER — Other Ambulatory Visit: Payer: Self-pay | Admitting: Cardiology

## 2023-04-17 DIAGNOSIS — Z23 Encounter for immunization: Secondary | ICD-10-CM | POA: Diagnosis not present

## 2023-04-17 DIAGNOSIS — R4582 Worries: Secondary | ICD-10-CM | POA: Diagnosis not present

## 2023-04-17 DIAGNOSIS — Z6821 Body mass index (BMI) 21.0-21.9, adult: Secondary | ICD-10-CM | POA: Diagnosis not present

## 2023-04-17 DIAGNOSIS — E7849 Other hyperlipidemia: Secondary | ICD-10-CM | POA: Diagnosis not present

## 2023-04-17 DIAGNOSIS — I10 Essential (primary) hypertension: Secondary | ICD-10-CM | POA: Diagnosis not present

## 2023-04-17 DIAGNOSIS — J301 Allergic rhinitis due to pollen: Secondary | ICD-10-CM | POA: Diagnosis not present

## 2023-04-17 DIAGNOSIS — I700 Atherosclerosis of aorta: Secondary | ICD-10-CM | POA: Diagnosis not present

## 2023-04-17 DIAGNOSIS — I482 Chronic atrial fibrillation, unspecified: Secondary | ICD-10-CM | POA: Diagnosis not present

## 2023-06-19 DIAGNOSIS — Z2089 Contact with and (suspected) exposure to other communicable diseases: Secondary | ICD-10-CM | POA: Diagnosis not present

## 2023-06-19 DIAGNOSIS — J0100 Acute maxillary sinusitis, unspecified: Secondary | ICD-10-CM | POA: Diagnosis not present

## 2023-06-19 DIAGNOSIS — Z20828 Contact with and (suspected) exposure to other viral communicable diseases: Secondary | ICD-10-CM | POA: Diagnosis not present

## 2023-06-19 DIAGNOSIS — Z6822 Body mass index (BMI) 22.0-22.9, adult: Secondary | ICD-10-CM | POA: Diagnosis not present

## 2023-06-30 ENCOUNTER — Ambulatory Visit: Payer: Medicare Other | Attending: Cardiovascular Disease | Admitting: Cardiovascular Disease

## 2023-06-30 ENCOUNTER — Encounter: Payer: Self-pay | Admitting: Cardiovascular Disease

## 2023-06-30 VITALS — BP 138/64 | HR 80 | Ht 63.0 in | Wt 122.8 lb

## 2023-06-30 DIAGNOSIS — I480 Paroxysmal atrial fibrillation: Secondary | ICD-10-CM

## 2023-06-30 NOTE — Patient Instructions (Signed)

## 2023-06-30 NOTE — Progress Notes (Signed)
   PCP: Richardean Chimera, MD   Primary EP: Dr Nelly Laurence  Kristen Mckenzie is a 83 y.o. female who presents today for routine electrophysiology followup.  Since last being seen in our clinic, the patient reports doing very well.  She has had very few palpitations over the past year and reports that they have not changes substantially in burden or severity. She does not think her current arrhythmia would warrant changing her medications. Today, she denies symptoms of  chest pain, shortness of breath,  lower extremity edema, dizziness, presyncope, or syncope.  The patient is otherwise without complaint today.       Physical Exam: Vitals:   06/30/23 1256  BP: 138/64  Pulse: 80  SpO2: 94%  Weight: 122 lb 12.8 oz (55.7 kg)  Height: 5\' 3"  (1.6 m)    Gen: Appears comfortable, well-nourished CV: RRR, no dependent edema Pulm: breathing easily   Wt Readings from Last 3 Encounters:  06/30/23 122 lb 12.8 oz (55.7 kg)  04/14/23 122 lb 12.8 oz (55.7 kg)  10/05/22 122 lb 12.8 oz (55.7 kg)    EKG tracing ordered today is personally reviewed and shows sinus  Assessment and Plan:  Paroxysmal atrial fibrillation Well controlled post ablation off AAD therapy Chads2vasc score is 4.   She is on eliquis 2.5 Normal renal function documented in the past 6 months.  2. HTN Stable No change required today  3. HL Continue lipitor 20mg  daily  Risks, benefits and potential toxicities for medications prescribed and/or refilled reviewed with patient today.   Return in a year  Maurice Small, MD 06/30/2023 1:22 PM

## 2023-07-12 ENCOUNTER — Other Ambulatory Visit: Payer: Self-pay | Admitting: Cardiology

## 2023-07-21 ENCOUNTER — Other Ambulatory Visit: Payer: Self-pay | Admitting: Cardiology

## 2023-07-28 ENCOUNTER — Other Ambulatory Visit: Payer: Self-pay | Admitting: Cardiology

## 2023-07-28 DIAGNOSIS — I480 Paroxysmal atrial fibrillation: Secondary | ICD-10-CM

## 2023-07-28 NOTE — Telephone Encounter (Signed)
 Prescription refill request for Eliquis received. Indication:afib Last office visit:3/25 Scr:0.7  6/24 Age: 83 Weight:55.7  kg  Prescription refilled

## 2023-09-15 ENCOUNTER — Telehealth: Payer: Self-pay | Admitting: Cardiology

## 2023-09-15 MED ORDER — SPIRONOLACTONE 25 MG PO TABS: 12.5000 mg | ORAL_TABLET | Freq: Every day | ORAL | Status: DC

## 2023-09-15 NOTE — Telephone Encounter (Signed)
 Pt c/o BP issue: STAT if pt c/o blurred vision, one-sided weakness or slurred speech.  STAT if BP is GREATER than 180/120 TODAY.  STAT if BP is LESS than 90/60 and SYMPTOMATIC TODAY  1. What is your BP concern?  BP is elevated   2. Have you taken any BP medication today? No, patient says she doesn't take her medication until 9:00 AM  3. What are your last 5 BP readings? 5/23 : 131/64 88 when she woke up  5/22: 144/68 71 morning           159/76 86 6:30 PM          184/76 85 10:45 PM          170/79 82 11:50 PM  4. Are you having any other symptoms (ex. Dizziness, headache, blurred vision, passed out)?  Dizziness

## 2023-09-15 NOTE — Telephone Encounter (Signed)
 Patient notified and verbalized understanding. Patient stated she will monitor bp/hr throughout the weekend and let our office know of any changes. Patient had no further questions or concerns at this time.

## 2023-09-15 NOTE — Telephone Encounter (Signed)
 Some variability but limited examples.  Would not make any changes at this time and instead recommend ongoing evaluation of blood pressure at home to see if a new trend is established, at which time we might consider adjusting medication.

## 2023-09-15 NOTE — Telephone Encounter (Signed)
 HR ranging form 71-88.  144/48  71 yesterday morning.  Does not take her BP meds till 9 am / 9 pm  No weight gain, chest pain or sob.  Does c/o dizziness.  States this is not stopping her from doing her daily activities.  Heart does not feel like it is racing or pounding.

## 2023-09-28 DIAGNOSIS — I10 Essential (primary) hypertension: Secondary | ICD-10-CM | POA: Diagnosis not present

## 2023-09-28 DIAGNOSIS — E7849 Other hyperlipidemia: Secondary | ICD-10-CM | POA: Diagnosis not present

## 2023-09-28 DIAGNOSIS — Z1329 Encounter for screening for other suspected endocrine disorder: Secondary | ICD-10-CM | POA: Diagnosis not present

## 2023-09-28 DIAGNOSIS — Z0001 Encounter for general adult medical examination with abnormal findings: Secondary | ICD-10-CM | POA: Diagnosis not present

## 2023-10-05 DIAGNOSIS — Z1331 Encounter for screening for depression: Secondary | ICD-10-CM | POA: Diagnosis not present

## 2023-10-05 DIAGNOSIS — I482 Chronic atrial fibrillation, unspecified: Secondary | ICD-10-CM | POA: Diagnosis not present

## 2023-10-05 DIAGNOSIS — Z0001 Encounter for general adult medical examination with abnormal findings: Secondary | ICD-10-CM | POA: Diagnosis not present

## 2023-10-05 DIAGNOSIS — Z6822 Body mass index (BMI) 22.0-22.9, adult: Secondary | ICD-10-CM | POA: Diagnosis not present

## 2023-10-05 DIAGNOSIS — E7849 Other hyperlipidemia: Secondary | ICD-10-CM | POA: Diagnosis not present

## 2023-10-05 DIAGNOSIS — I10 Essential (primary) hypertension: Secondary | ICD-10-CM | POA: Diagnosis not present

## 2023-10-05 DIAGNOSIS — Z1389 Encounter for screening for other disorder: Secondary | ICD-10-CM | POA: Diagnosis not present

## 2023-10-05 DIAGNOSIS — E782 Mixed hyperlipidemia: Secondary | ICD-10-CM | POA: Diagnosis not present

## 2023-12-12 ENCOUNTER — Emergency Department (HOSPITAL_COMMUNITY)

## 2023-12-12 ENCOUNTER — Encounter (HOSPITAL_COMMUNITY): Payer: Self-pay | Admitting: *Deleted

## 2023-12-12 ENCOUNTER — Other Ambulatory Visit: Payer: Self-pay

## 2023-12-12 ENCOUNTER — Telehealth: Payer: Self-pay | Admitting: Cardiology

## 2023-12-12 ENCOUNTER — Emergency Department (HOSPITAL_COMMUNITY)
Admission: EM | Admit: 2023-12-12 | Discharge: 2023-12-12 | Disposition: A | Discharge status: Home / Self Care | Source: Ambulatory Visit | Attending: Emergency Medicine | Admitting: Emergency Medicine

## 2023-12-12 DIAGNOSIS — Z79899 Other long term (current) drug therapy: Secondary | ICD-10-CM | POA: Insufficient documentation

## 2023-12-12 DIAGNOSIS — I4891 Unspecified atrial fibrillation: Secondary | ICD-10-CM | POA: Insufficient documentation

## 2023-12-12 DIAGNOSIS — Z7901 Long term (current) use of anticoagulants: Secondary | ICD-10-CM | POA: Diagnosis not present

## 2023-12-12 DIAGNOSIS — M545 Low back pain, unspecified: Secondary | ICD-10-CM | POA: Diagnosis not present

## 2023-12-12 DIAGNOSIS — M546 Pain in thoracic spine: Secondary | ICD-10-CM | POA: Diagnosis not present

## 2023-12-12 DIAGNOSIS — R079 Chest pain, unspecified: Secondary | ICD-10-CM | POA: Diagnosis not present

## 2023-12-12 DIAGNOSIS — M25512 Pain in left shoulder: Secondary | ICD-10-CM | POA: Diagnosis not present

## 2023-12-12 LAB — URINALYSIS, ROUTINE W REFLEX MICROSCOPIC
Bilirubin Urine: NEGATIVE
Glucose, UA: NEGATIVE mg/dL
Hgb urine dipstick: NEGATIVE
Ketones, ur: NEGATIVE mg/dL
Leukocytes,Ua: NEGATIVE
Nitrite: NEGATIVE
Protein, ur: NEGATIVE mg/dL
Specific Gravity, Urine: 1.005 (ref 1.005–1.030)
pH: 7 (ref 5.0–8.0)

## 2023-12-12 LAB — CBC WITH DIFFERENTIAL/PLATELET
Abs Immature Granulocytes: 0.02 K/uL (ref 0.00–0.07)
Basophils Absolute: 0.1 K/uL (ref 0.0–0.1)
Basophils Relative: 1 %
Eosinophils Absolute: 0.1 K/uL (ref 0.0–0.5)
Eosinophils Relative: 1 %
HCT: 38.2 % (ref 36.0–46.0)
Hemoglobin: 13.4 g/dL (ref 12.0–15.0)
Immature Granulocytes: 0 %
Lymphocytes Relative: 13 %
Lymphs Abs: 1.4 K/uL (ref 0.7–4.0)
MCH: 32.9 pg (ref 26.0–34.0)
MCHC: 35.1 g/dL (ref 30.0–36.0)
MCV: 93.9 fL (ref 80.0–100.0)
Monocytes Absolute: 1 K/uL (ref 0.1–1.0)
Monocytes Relative: 9 %
Neutro Abs: 8.3 K/uL — ABNORMAL HIGH (ref 1.7–7.7)
Neutrophils Relative %: 76 %
Platelets: 213 K/uL (ref 150–400)
RBC: 4.07 MIL/uL (ref 3.87–5.11)
RDW: 12.7 % (ref 11.5–15.5)
WBC: 10.8 K/uL — ABNORMAL HIGH (ref 4.0–10.5)
nRBC: 0 % (ref 0.0–0.2)

## 2023-12-12 LAB — COMPREHENSIVE METABOLIC PANEL WITH GFR
ALT: 17 U/L (ref 0–44)
AST: 29 U/L (ref 15–41)
Albumin: 3.9 g/dL (ref 3.5–5.0)
Alkaline Phosphatase: 87 U/L (ref 38–126)
Anion gap: 15 (ref 5–15)
BUN: 14 mg/dL (ref 8–23)
CO2: 22 mmol/L (ref 22–32)
Calcium: 9.3 mg/dL (ref 8.9–10.3)
Chloride: 95 mmol/L — ABNORMAL LOW (ref 98–111)
Creatinine, Ser: 0.73 mg/dL (ref 0.44–1.00)
GFR, Estimated: >60 mL/min (ref >60)
Glucose, Bld: 157 mg/dL — ABNORMAL HIGH (ref 70–99)
Potassium: 4.3 mmol/L (ref 3.5–5.1)
Sodium: 132 mmol/L — ABNORMAL LOW (ref 135–145)
Total Bilirubin: 1.1 mg/dL (ref 0.0–1.2)
Total Protein: 6.8 g/dL (ref 6.5–8.1)

## 2023-12-12 LAB — MAGNESIUM: Magnesium: 1.8 mg/dL (ref 1.7–2.4)

## 2023-12-12 MED ORDER — LACTATED RINGERS IV BOLUS
1000.0000 mL | Freq: Once | INTRAVENOUS | Status: AC
  Administered 2023-12-12: 1000 mL via INTRAVENOUS

## 2023-12-12 MED ORDER — METOPROLOL TARTRATE 5 MG/5ML IV SOLN
2.5000 mg | INTRAVENOUS | Status: DC | PRN
  Administered 2023-12-12: 2.5 mg via INTRAVENOUS
  Filled 2023-12-12: qty 5

## 2023-12-12 MED ORDER — PHENYLEPHRINE 80 MCG/ML (10ML) SYRINGE FOR IV PUSH (FOR BLOOD PRESSURE SUPPORT)
80.0000 ug | PREFILLED_SYRINGE | Freq: Once | INTRAVENOUS | Status: DC | PRN
  Filled 2023-12-12: qty 10

## 2023-12-12 MED ORDER — ETOMIDATE 2 MG/ML IV SOLN
0.1500 mg/kg | Freq: Once | INTRAVENOUS | Status: AC
  Administered 2023-12-12: 8.1 mg via INTRAVENOUS
  Filled 2023-12-12: qty 10

## 2023-12-12 MED ORDER — ONDANSETRON HCL 4 MG/2ML IJ SOLN
4.0000 mg | Freq: Once | INTRAMUSCULAR | Status: AC
  Administered 2023-12-12: 4 mg via INTRAVENOUS
  Filled 2023-12-12: qty 2

## 2023-12-12 NOTE — Telephone Encounter (Signed)
 Update: staff called patient to make apt and patient reports her bp machine is reading BP 80/50 with HR 171. She denies any sx's of SOB,CP   I advised her to go direct to the ED and she agrees.She does have a f/u apt scheduled Friday 8/22 in the [facility] with Dr.McDowell.

## 2023-12-12 NOTE — ED Provider Notes (Signed)
 [place] EMERGENCY DEPARTMENT AT [facility] Provider Note   CSN: [id number] Arrival date & time: 12/12/23  1018     History {Add pertinent medical, surgical, social history, OB history to HPI:1} Chief Complaint  Patient presents with   Chest Pain    Kristen Mckenzie is a 83 y.o. female with PMH as listed below who presents with palpitations that started at 5 am. Feels like her heart is racing.  Also complaining of L shoulder and upper back pain. Patient denies cough, flu-like sxs, or SOB but endorses generalized weakness. Denies N/V/D, abd pain, urinary sxs, leg swelling, h/o DVT/PE. Takes eliquis  for AF history.  She reports she has been eating and drinking normally.  Denies missing any doses of her medicines.  Per chart review patient does have a history of atrial fibrillation.  She was actually admitted in June 2021 for A-fib with RVR complicated by chest pain and hypotension and required DCCV cardioversion.  She is also s/p now and ablation For her A-fib.  Based on her most recent EKG she does not normally live in atrial fibrillation.   Past Medical History:  Diagnosis Date   Anxiety    Arthritis    Cholecystolithiasis    Collagen vascular disease (HCC)    Cystocele with prolapse    Hypertension    PAF (paroxysmal atrial fibrillation) (HCC)    Rectocele        Home Medications Prior to Admission medications   Medication Sig Start Date End Date Taking? Authorizing Provider  acetaminophen  (TYLENOL ) 325 MG tablet Take 2 tablets (650 mg total) by mouth every 4 (four) hours as needed for headache or mild pain. 09/20/19   Lesia Ozell Barter, PA-C  cholecalciferol  (VITAMIN D ) 1000 units tablet Take 2,000 Units by mouth daily.    [provider]  COD LIVER OIL PO Take 1 capsule by mouth every other day.     [provider]  Cyanocobalamin (VITAMIN B-12 PO) Take 1 tablet by mouth daily.    [provider]  ELIQUIS  2.5 MG TABS tablet TAKE  1 TABLET BY MOUTH TWICE A DAY 07/28/23   Debera Jayson MATSU, MD  metoprolol  tartrate (LOPRESSOR ) 25 MG tablet TAKE 1/2 TABLET BY MOUTH 2 (TWO) TIMES DAILY AS NEEDED FOR PALPITATIONS, MAX 4 TABS/24 HOURS Patient taking differently: Take 12.5 mg by mouth 2 (two) times daily. 07/12/23   Debera Jayson MATSU, MD  Multiple Vitamin (MULTIVITAMIN WITH MINERALS) TABS tablet Take 1 tablet daily by mouth.    [provider]  pantoprazole  (PROTONIX ) 40 MG tablet Take 40 mg by mouth daily as needed (heartburn). As needed    [provider]  Potassium 99 MG TABS Take 99 mg by mouth 3 (three) times a week.     [provider]  Pyridoxine HCl (VITAMIN B-6 PO) Take 1 tablet by mouth daily.    [provider]  spironolactone  (ALDACTONE ) 25 MG tablet Take 0.5 tablets (12.5 mg total) by mouth daily at 12 noon. 09/15/23   Debera Jayson MATSU, MD  valsartan  (DIOVAN ) 40 MG tablet TAKE 1/2 TABLET BY MOUTH DAILY Patient taking differently: Take 20 mg by mouth every morning. Mid morning. 07/21/23   Debera Jayson MATSU, MD  zolpidem  (AMBIEN ) 5 MG tablet Take 5 mg by mouth at bedtime as needed for sleep.     [provider]      Allergies    Phenobarbital, Tamiflu [oseltamivir phosphate], Depakote [valproic acid], and Lisinopril   Review of Systems   Review of Systems A 10 point review of systems was performed and is negative unless otherwise reported in HPI.  Physical Exam Updated Vital Signs BP 126/72   Pulse 63   Temp 98.2 F (36.8 C) (Oral)   Resp 18   Ht 5' 3 (1.6 m)   Wt 54 kg   SpO2 98%   BMI 21.08 kg/m  Physical Exam General: Normal appearing {Desc; female/female:11659}, lying in bed.  HEENT: PERRLA, Sclera anicteric, MMM, trachea midline.  Cardiology: RRR, no murmurs/rubs/gallops. BL radial and DP pulses equal bilaterally.  Resp: Normal respiratory rate and effort. CTAB, no wheezes, rhonchi, crackles.  Abd: Soft, non-tender, non-distended. No rebound tenderness  or guarding.  GU: Deferred. MSK: No peripheral edema or signs of trauma. Extremities without deformity or TTP. No cyanosis or clubbing. Skin: warm, dry. No rashes or lesions. Back: No CVA tenderness Neuro: A&Ox4, CNs II-XII grossly intact. MAEs. Sensation grossly intact.  Psych: Normal mood and affect.   ED Results / Procedures / Treatments   Labs (all labs ordered are listed, but only abnormal results are displayed) Labs Reviewed  CBC WITH DIFFERENTIAL/PLATELET - Abnormal; Notable for the following components:      Result Value   WBC 10.8 (*)    Neutro Abs 8.3 (*)    All other components within normal limits  COMPREHENSIVE METABOLIC PANEL WITH GFR - Abnormal; Notable for the following components:   Sodium 132 (*)    Chloride 95 (*)    Glucose, Bld 157 (*)    All other components within normal limits  MAGNESIUM   URINALYSIS, ROUTINE W REFLEX MICROSCOPIC  I-STAT CHEM 8, ED    EKG EKG Interpretation Date/Time:  Tuesday December 12 2023 10:43:27 EDT Ventricular Rate:  142 PR Interval:    QRS Duration:  86 QT Interval:  294 QTC Calculation: 452 R Axis:   63  Text Interpretation: Atrial fibrillation with rapid V-rate Low voltage, precordial leads Minimal ST depression Confirmed by Franklyn Gills [phone]) on 12/12/2023 11:34:12 AM  Radiology DG Chest Portable 1 View Result Date: 12/12/2023 CLINICAL DATA:  Chest pain. EXAM: PORTABLE CHEST 1 VIEW COMPARISON:  05/04/2022. FINDINGS: The heart size and mediastinal contours are within normal limits. Chronic interstitial coarsening. No focal consolidation, pleural effusion, or pneumothorax. Diffuse osseous demineralization. No acute osseous abnormality. IMPRESSION: 1. No acute cardiopulmonary findings. 2. Chronic interstitial coarsening. Electronically Signed   By: Harrietta Sherry M.D.   On: 12/12/2023 12:22    Procedures .Sedation  Date/Time: 12/12/2023 1:57 PM  Performed by: Franklyn Gills SAILOR, MD Authorized by: Franklyn Gills SAILOR, MD    Consent:    Consent obtained:  Written   Consent given by:  Patient   Risks discussed:  Allergic reaction, dysrhythmia, inadequate sedation, nausea, vomiting, respiratory compromise necessitating ventilatory assistance and intubation, prolonged sedation necessitating reversal and prolonged hypoxia resulting in organ damage   Alternatives discussed:  Analgesia without sedation Universal protocol:    Immediately prior to procedure, a time out was called: yes     Patient identity confirmed:  Arm band Indications:    Procedure performed:  Cardioversion   Procedure necessitating sedation performed by:  Physician performing sedation Pre-sedation assessment:    Time since last food or drink:  5 hours   ASA classification: class 2 - patient with mild systemic disease     Mouth opening:  2 finger widths   Thyromental distance:  2 finger widths   Mallampati score:  III - soft palate,  base of uvula visible   Neck mobility: normal     Pre-sedation assessments completed and reviewed: pre-procedure airway patency not reviewed, pre-procedure cardiovascular function not reviewed, pre-procedure hydration status not reviewed, pre-procedure mental status not reviewed, pre-procedure nausea and vomiting status not reviewed, pre-procedure pain level not reviewed, pre-procedure respiratory function not reviewed and pre-procedure temperature not reviewed   A pre-sedation assessment was completed prior to the start of the procedure Immediate pre-procedure details:    Reassessment: Patient reassessed immediately prior to procedure     Reviewed: vital signs, relevant labs/tests and NPO status     Verified: bag valve mask available, emergency equipment available, intubation equipment available and IV patency confirmed   Procedure details (see MAR for exact dosages):    Preoxygenation:  Nasal cannula   Sedation:  Etomidate    Intended level of sedation: deep   Intra-procedure monitoring:  Blood pressure monitoring,  cardiac monitor, continuous pulse oximetry, frequent LOC assessments, frequent vital sign checks and continuous capnometry   Intra-procedure events: none     Intra-procedure management:  Airway repositioning   Total Provider sedation time (minutes):  12 Post-procedure details:   A post-sedation assessment was completed following the completion of the procedure.   Attendance: Constant attendance by certified staff until patient recovered     Recovery: Patient returned to pre-procedure baseline     Post-sedation assessments completed and reviewed: post-procedure airway patency not reviewed, post-procedure cardiovascular function not reviewed, post-procedure hydration status not reviewed, post-procedure mental status not reviewed, post-procedure nausea and vomiting status not reviewed, pain score not reviewed, post-procedure respiratory function not reviewed and post-procedure temperature not reviewed     Patient is stable for discharge or admission: yes     Procedure completion:  Tolerated well, no immediate complications .Cardioversion  Date/Time: 12/12/2023 1:59 PM  Performed by: Franklyn Sid SAILOR, MD Authorized by: Franklyn Sid SAILOR, MD   Consent:    Consent obtained:  Written   Consent given by:  Patient   Risks discussed:  Cutaneous burn, induced arrhythmia, death and pain   Alternatives discussed:  Rate-control medication Pre-procedure details:    Cardioversion basis:  Emergent   Rhythm:  Atrial fibrillation   Electrode placement:  Anterior-posterior Patient sedated: Yes. Refer to sedation procedure documentation for details of sedation.  Attempt one:    Cardioversion mode:  Synchronous   Waveform:  Biphasic   Shock (Joules):  120   Shock outcome:  Conversion to normal sinus rhythm Post-procedure details:    Patient status:  Awake   Patient tolerance of procedure:  Tolerated well, no immediate complications   {Document cardiac monitor, telemetry assessment procedure when  appropriate:1}  Medications Ordered in ED Medications  metoprolol  tartrate (LOPRESSOR ) injection 2.5 mg (2.5 mg Intravenous Given 12/12/23 1142)  PHENYLephrine  80 mcg/ml in normal saline Adult IV Push Syringe (For Blood Pressure Support) (has no administration in time range)  lactated ringers  bolus 1,000 mL (0 mLs Intravenous Stopped 12/12/23 1303)  ondansetron  (ZOFRAN ) injection 4 mg (4 mg Intravenous Given 12/12/23 1312)  etomidate  (AMIDATE ) injection 8.1 mg (8.1 mg Intravenous Given 12/12/23 1349)    ED Course/ Medical Decision Making/ A&P                          Medical Decision Making Amount and/or Complexity of Data Reviewed Labs: ordered. Radiology: ordered.  Risk Prescription drug management.    This patient presents to the ED for concern of palpitations/heart racing/CP, this involves an extensive  number of treatment options, and is a complaint that carries with it a high risk of complications and morbidity.  I considered the following differential and admission for this acute, potentially life threatening condition.   MDM:    Pt with atrial fibrillation with RVR.   Patient ***  Clinical Course as of 12/12/23 1359  Tue Dec 12, 2023  1132 BP(!): 78/52 Initial BP low in 70s systolic, but improved to 100 systolic without any intervention [HN]  1133 BP: 114/86 Improved after 500 cc LR [HN]  1155 After one dose of 2.5 mg IV metoprolol , patient's BP in 90s systolic, and withheld additional doses. [HN]  1357 Cardioverted successfully to  NSR [HN]    Clinical Course User Index [HN] Franklyn Sid SAILOR, MD    Labs: I Ordered, and personally interpreted labs.  The pertinent results include:  those listed above  Imaging Studies ordered: I ordered imaging studies including CXR I independently visualized and interpreted imaging. I agree with the radiologist interpretation  Additional history obtained from chart review daughter at bedside.   Cardiac Monitoring: The patient  was maintained on a cardiac monitor.  I personally viewed and interpreted the cardiac monitored which showed an underlying rhythm of: A-fib with RVR  Reevaluation: After the interventions noted above, I reevaluated the patient and found that they have :resolved  Social Determinants of Health:  lives independently  Disposition:  ***  Co morbidities that complicate the patient evaluation  Past Medical History:  Diagnosis Date   Anxiety    Arthritis    Cholecystolithiasis    Collagen vascular disease (HCC)    Cystocele with prolapse    Hypertension    PAF (paroxysmal atrial fibrillation) (HCC)    Rectocele      Medicines Meds ordered this encounter  Medications   metoprolol  tartrate (LOPRESSOR ) injection 2.5 mg   lactated ringers  bolus 1,000 mL   ondansetron  (ZOFRAN ) injection 4 mg   etomidate  (AMIDATE ) injection 8.1 mg   PHENYLephrine  80 mcg/ml in normal saline Adult IV Push Syringe (For Blood Pressure Support)    I have reviewed the patients home medicines and have made adjustments as needed  Problem List / ED Course: Problem List Items Addressed This Visit   None        {Document critical care time when appropriate:1} {Document review of labs and clinical decision tools ie heart score, Chads2Vasc2 etc:1}  {Document your independent review of radiology images, and any outside records:1} {Document your discussion with family members, caretakers, and with consultants:1} {Document social determinants of health affecting pt's care:1} {Document your decision making why or why not admission, treatments were needed:1}  This note was created using dictation software, which may contain spelling or grammatical errors.

## 2023-12-12 NOTE — ED Triage Notes (Signed)
 Pt states she woke up this am and felt her heart racing and was having upper back pain  Pt denies any sob but states she feels weak

## 2023-12-12 NOTE — Telephone Encounter (Signed)
 Was due for follow up in June, will ask staff to schedule appointment.   I will forward to Dr.McDowell to review BP's

## 2023-12-12 NOTE — ED Notes (Signed)
 Sync cardioversion complete my MD Franklyn, pt cardioverted with 120J and converted to a sinus rhythm.

## 2023-12-12 NOTE — Telephone Encounter (Signed)
 Pt c/o BP issue: STAT if pt c/o blurred vision, one-sided weakness or slurred speech.  STAT if BP is GREATER than 180/120 TODAY.  STAT if BP is LESS than 90/60 and SYMPTOMATIC TODAY  1. What is your BP concern?   Patient is concerned her BP has been trending high  2. Have you taken any BP medication today?  Patient stated - Yes, Metoprolol  (1/2 dosage), will take BP medication at 9:00 am  3. What are your last 5 BP readings?  132/89  HR 171 (today at 7:30 am, 8/19) 8/17 - 145/74  HR 74 8/15 -  140/70  HR 68  8/14 - 156/70  HR 75  4. Are you having any other symptoms (ex. Dizziness, headache, blurred vision, passed out)?   No  Patient is concerned she can feel her heart beating in her chest.

## 2023-12-12 NOTE — Discharge Instructions (Signed)
 Thank you for coming to [facility] Emergency Department. You were seen for fast heart rate. We did an exam, labs, and imaging, and these showed atrial fibrillation with rapid ventricular response.  We were able to shock your heart back into a normal rhythm.  Please continue to take your medications as prescribed and follow-up with your cardiologist Dr. Debera within 1 week.   Do not hesitate to return to the ED or call 911 if you experience: -Worsening symptoms -Chest pain, shortness of breath -Lightheadedness, passing out -Fevers/chills -Anything else that concerns you

## 2023-12-13 NOTE — Telephone Encounter (Signed)
 Update: had cardioversion in the ED at [facility] yesterday, converted back to SR and was discharged.

## 2023-12-15 ENCOUNTER — Encounter: Payer: Self-pay | Admitting: Cardiology

## 2023-12-15 ENCOUNTER — Ambulatory Visit: Attending: Cardiology | Admitting: Cardiology

## 2023-12-15 VITALS — BP 130/60 | HR 73 | Ht 62.5 in | Wt 123.2 lb

## 2023-12-15 DIAGNOSIS — I10 Essential (primary) hypertension: Secondary | ICD-10-CM | POA: Insufficient documentation

## 2023-12-15 DIAGNOSIS — I480 Paroxysmal atrial fibrillation: Secondary | ICD-10-CM | POA: Diagnosis not present

## 2023-12-15 DIAGNOSIS — I4891 Unspecified atrial fibrillation: Secondary | ICD-10-CM | POA: Insufficient documentation

## 2023-12-15 NOTE — Patient Instructions (Signed)
 Medication Instructions:  Your physician recommends that you continue on your current medications as directed. Please refer to the Current Medication list given to you today.   Labwork: None today  Testing/Procedures: None today  Follow-Up: 6 months  Any Other Special Instructions Will Be Listed Below (If Applicable).  If you need a refill on your cardiac medications before your next appointment, please call your pharmacy.

## 2023-12-15 NOTE — Progress Notes (Signed)
    Cardiology Office Note  Date: 12/15/2023   ID: Duanna, Runk 1940/08/18, MRN [id number]  History of Present Illness: Kristen Mckenzie is an 83 y.o. female last seen in December 2024.  She had an interval visit with Dr. Nancey in March, I reviewed the note.  She recently had an episode of symptomatic atrial fibrillation with RVR and underwent cardioversion at ER encounter on August 19.  Episode lasted about 8 hours overall.  She has been back to baseline since that time.  She cannot recall having a similar episode over the last few years.  We went over her medications.  No changes in baseline regimen.  She does not report any spontaneous bleeding problems on Eliquis .  I reviewed her ECG today which shows normal sinus rhythm with low voltage.  Physical Exam: VS:  BP 130/60 (BP Location: Right Arm, Cuff Size: Normal)   Pulse 73   Ht 5' 2.5 (1.588 m)   Wt 123 lb 3.2 oz (55.9 kg)   SpO2 94%   BMI 22.17 kg/m , BMI Body mass index is 22.17 kg/m.  Wt Readings from Last 3 Encounters:  12/15/23 123 lb 3.2 oz (55.9 kg)  12/12/23 119 lb (54 kg)  06/30/23 122 lb 12.8 oz (55.7 kg)    General: Patient appears comfortable at rest. HEENT: Conjunctiva and lids normal. Neck: Supple, no elevated JVP. Lungs: Clear to auscultation, nonlabored breathing at rest. Cardiac: Regular rate and rhythm, no S3, 2/6 systolic murmur.  ECG:  An ECG dated 12/12/2023 was personally reviewed today and demonstrated:  Atrial fibrillation with RVR.  Labwork: 12/12/2023: ALT 17; AST 29; BUN 14; Creatinine, Ser 0.73; Hemoglobin 13.4; Magnesium  1.8; Platelets 213; Potassium 4.3; Sodium 132   Other Studies Reviewed Today:  No interval cardiac testing for review today.  Assessment and Plan:  1.  Paroxysmal atrial fibrillation with CHA2DS2-VASc score of 4.  She underwent atrial fibrillation ablation with Dr. Kelsie back in 2021 and has not been on antiarrhythmic therapy (previously on propafenone ).  She has  generally done very well with only brief palpitations on Lopressor  12.5 mg twice daily and Eliquis  2.5 mg twice daily.  Recent prolonged symptomatic episode of atrial fibrillation with RVR requiring cardioversion in the ER on August 19.  She is back to baseline.  Not clear that we need to consider resumption of antiarrhythmic therapy or follow-up ablation unless episodes increase in frequency.  We will continue the current regimen and regular follow-up.  She continues to follow with Dr. Nancey as well.   2.  Primary hypertension.  No changes made to current regimen.  Disposition:  Follow up 6 months.  Signed, Jayson JUDITHANN Sierras, M.D., F.A.C.C.  [facility]

## 2023-12-21 DIAGNOSIS — Z85828 Personal history of other malignant neoplasm of skin: Secondary | ICD-10-CM | POA: Diagnosis not present

## 2023-12-21 DIAGNOSIS — Z08 Encounter for follow-up examination after completed treatment for malignant neoplasm: Secondary | ICD-10-CM | POA: Diagnosis not present

## 2023-12-21 DIAGNOSIS — L570 Actinic keratosis: Secondary | ICD-10-CM | POA: Diagnosis not present

## 2023-12-21 DIAGNOSIS — Z1283 Encounter for screening for malignant neoplasm of skin: Secondary | ICD-10-CM | POA: Diagnosis not present

## 2023-12-21 DIAGNOSIS — B078 Other viral warts: Secondary | ICD-10-CM | POA: Diagnosis not present

## 2023-12-21 DIAGNOSIS — X32XXXA Exposure to sunlight, initial encounter: Secondary | ICD-10-CM | POA: Diagnosis not present

## 2024-01-20 ENCOUNTER — Other Ambulatory Visit: Payer: Self-pay | Admitting: Cardiology

## 2024-01-20 DIAGNOSIS — I480 Paroxysmal atrial fibrillation: Secondary | ICD-10-CM

## 2024-01-22 ENCOUNTER — Telehealth: Payer: Self-pay | Admitting: Cardiology

## 2024-01-22 ENCOUNTER — Other Ambulatory Visit: Payer: Self-pay

## 2024-01-22 MED ORDER — SPIRONOLACTONE 25 MG PO TABS: 12.5000 mg | ORAL_TABLET | Freq: Every day | ORAL | 3 refills | Status: AC

## 2024-01-22 MED ORDER — SPIRONOLACTONE 25 MG PO TABS: 12.5000 mg | ORAL_TABLET | Freq: Every day | ORAL | 3 refills | Status: DC

## 2024-01-22 NOTE — Telephone Encounter (Signed)
 Prescription refill request for Eliquis  received. Indication: A-Fib Last office visit:  12/15/2023 Scr: 0.73  last lab in hospital 12/12/2023 Age:  83 yrs Weight: 55.9 kg  Pt meets protocol and medication was sent to pt's pharmacy.

## 2024-01-22 NOTE — Telephone Encounter (Signed)
*  STAT* If patient is at the pharmacy, call can be transferred to refill team.   1. Which medications need to be refilled? (please list name of each medication and dose if known)  ELIQUIS  2.5 MG TABS tablet  spironolactone  (ALDACTONE ) 25 MG tablet    2. Which pharmacy/location (including street and city if local pharmacy) is medication to be sent to? [id number] - [place], [place] - [address]  3. Do they need a 30 day or 90 day supply?  90 day supply

## 2024-02-08 DIAGNOSIS — C44722 Squamous cell carcinoma of skin of right lower limb, including hip: Secondary | ICD-10-CM | POA: Diagnosis not present

## 2024-02-27 DIAGNOSIS — Z08 Encounter for follow-up examination after completed treatment for malignant neoplasm: Secondary | ICD-10-CM | POA: Diagnosis not present

## 2024-02-27 DIAGNOSIS — Z85828 Personal history of other malignant neoplasm of skin: Secondary | ICD-10-CM | POA: Diagnosis not present

## 2024-02-27 DIAGNOSIS — L928 Other granulomatous disorders of the skin and subcutaneous tissue: Secondary | ICD-10-CM | POA: Diagnosis not present

## 2024-04-01 ENCOUNTER — Telehealth: Payer: Self-pay | Admitting: Cardiology

## 2024-04-01 NOTE — Telephone Encounter (Signed)
 Reports pounding on the top of her head rated 8/10 and dizziness for the past 2 weeks. Reports she is not currently feeling the pounding or dizziness. Reports she feels dizziness on when her head is pounding. Reports taking tylenol  for posterior left shoulder blade pain and says it helps a little bit. Reports she started different exercises at [employer] one week ago and feels this is the cause of her shoulder pain and is unsure if the pounding and dizzy feelings are related to this. Denies SOB or chest pain. Reports checking home blood pressure readings and admits home blood pressures are not resting numbers. Reports checking her BP's immediately after sitting down.  Additional home BP readings listed below. 12/01 150/64 @10 :00 am after medications  12/02 167/75  HR 83 @4 :00 pm & 168/72 HR 89 @7 :30 pm 12/04 182/79 HR 70 @10 :00 pm reports taking extra valsartan  20 mg 12/05 152/74 HR 71 @8 :00 am before medications & 168/83 HR 67 @10 :00 pm Medications reviewed. Advised to take valsartan  20 mg in the morning and 20 mg in the evening. Advised to monitor resting home blood pressures, waiting 5-10 minutes after sitting with consistency, same arm, same cuff, same time. Advised to contact our office in one week with her BP readings. Advised if she develops worsening symptoms to report to the ED for an evaluation.  Verbalized understanding of plan.

## 2024-04-01 NOTE — Telephone Encounter (Signed)
 Pt c/o BP issue: STAT if pt c/o blurred vision, one-sided weakness or slurred speech.  STAT if BP is GREATER than 180/120 TODAY.  STAT if BP is LESS than 90/60 and SYMPTOMATIC TODAY  1. What is your BP concern?   Patient is concerned her BP readings have been trending high  2. Have you taken any BP medication today?  No  3. What are your last 5 BP readings?  8:30 am - 147/74  HR 73 (today) 180/84  HR 82 last night at 8:30 pm 144/68  HR 70 yesterday at 8:30 am 155/71  HR 69 yesterday at 12:30 pm 182/75  HR 74 - 12/6  9:30 pm 160/66  HR 77 - 12/6  6:30 am  4. Are you having any other symptoms (ex. Dizziness, headache, blurred vision, passed out)?   Dizziness and headache

## 2024-04-03 DIAGNOSIS — E7849 Other hyperlipidemia: Secondary | ICD-10-CM | POA: Diagnosis not present

## 2024-04-03 DIAGNOSIS — Z0001 Encounter for general adult medical examination with abnormal findings: Secondary | ICD-10-CM | POA: Diagnosis not present

## 2024-04-03 DIAGNOSIS — I10 Essential (primary) hypertension: Secondary | ICD-10-CM | POA: Diagnosis not present

## 2024-06-05 ENCOUNTER — Ambulatory Visit: Admitting: Cardiology

## 2024-06-28 ENCOUNTER — Ambulatory Visit: Admitting: Cardiovascular Disease
